# Patient Record
Sex: Female | Born: 1935 | Race: White | Hispanic: No | Smoking: Never smoker
Health system: Southern US, Community
[De-identification: ages and names within clinical notes are randomized; demographics above are authoritative.]

## PROBLEM LIST (undated history)

## (undated) DIAGNOSIS — I517 Cardiomegaly: Secondary | ICD-10-CM

## (undated) DIAGNOSIS — I509 Heart failure, unspecified: Secondary | ICD-10-CM

## (undated) DIAGNOSIS — Z90710 Acquired absence of both cervix and uterus: Secondary | ICD-10-CM

## (undated) DIAGNOSIS — G250 Essential tremor: Secondary | ICD-10-CM

## (undated) DIAGNOSIS — M797 Fibromyalgia: Secondary | ICD-10-CM

## (undated) DIAGNOSIS — W19XXXA Unspecified fall, initial encounter: Secondary | ICD-10-CM

## (undated) DIAGNOSIS — R296 Repeated falls: Secondary | ICD-10-CM

## (undated) DIAGNOSIS — D649 Anemia, unspecified: Secondary | ICD-10-CM

## (undated) DIAGNOSIS — I4891 Unspecified atrial fibrillation: Secondary | ICD-10-CM

## (undated) DIAGNOSIS — R0600 Dyspnea, unspecified: Secondary | ICD-10-CM

## (undated) DIAGNOSIS — F0390 Unspecified dementia without behavioral disturbance: Secondary | ICD-10-CM

## (undated) DIAGNOSIS — IMO0002 Reserved for concepts with insufficient information to code with codable children: Secondary | ICD-10-CM

## (undated) DIAGNOSIS — K5790 Diverticulosis of intestine, part unspecified, without perforation or abscess without bleeding: Secondary | ICD-10-CM

## (undated) DIAGNOSIS — M1990 Unspecified osteoarthritis, unspecified site: Secondary | ICD-10-CM

## (undated) DIAGNOSIS — F329 Major depressive disorder, single episode, unspecified: Secondary | ICD-10-CM

## (undated) DIAGNOSIS — J449 Chronic obstructive pulmonary disease, unspecified: Secondary | ICD-10-CM

## (undated) DIAGNOSIS — F32A Depression, unspecified: Secondary | ICD-10-CM

## (undated) DIAGNOSIS — Z86010 Personal history of colonic polyps: Secondary | ICD-10-CM

## (undated) DIAGNOSIS — R42 Dizziness and giddiness: Secondary | ICD-10-CM

## (undated) DIAGNOSIS — I10 Essential (primary) hypertension: Secondary | ICD-10-CM

## (undated) DIAGNOSIS — R251 Tremor, unspecified: Secondary | ICD-10-CM

## (undated) DIAGNOSIS — D509 Iron deficiency anemia, unspecified: Secondary | ICD-10-CM

## (undated) DIAGNOSIS — F419 Anxiety disorder, unspecified: Secondary | ICD-10-CM

## (undated) DIAGNOSIS — K219 Gastro-esophageal reflux disease without esophagitis: Secondary | ICD-10-CM

## (undated) DIAGNOSIS — G252 Other specified forms of tremor: Secondary | ICD-10-CM

## (undated) DIAGNOSIS — E039 Hypothyroidism, unspecified: Secondary | ICD-10-CM

## (undated) DIAGNOSIS — C801 Malignant (primary) neoplasm, unspecified: Secondary | ICD-10-CM

## (undated) DIAGNOSIS — Z86718 Personal history of other venous thrombosis and embolism: Secondary | ICD-10-CM

## (undated) DIAGNOSIS — K449 Diaphragmatic hernia without obstruction or gangrene: Secondary | ICD-10-CM

## (undated) HISTORY — DX: Other specified forms of tremor: G25.2

## (undated) HISTORY — DX: Chronic obstructive pulmonary disease, unspecified: J44.9

## (undated) HISTORY — PX: CATARACT EXTRACTION, BILATERAL: SHX1313

## (undated) HISTORY — DX: Dyspnea, unspecified: R06.00

## (undated) HISTORY — DX: Hypothyroidism, unspecified: E03.9

## (undated) HISTORY — DX: Personal history of other venous thrombosis and embolism: Z86.718

## (undated) HISTORY — DX: Malignant (primary) neoplasm, unspecified: C80.1

## (undated) HISTORY — DX: Essential (primary) hypertension: I10

## (undated) HISTORY — DX: Unspecified dementia without behavioral disturbance: F03.90

## (undated) HISTORY — DX: Unspecified fall, initial encounter: W19.XXXA

## (undated) HISTORY — DX: Anxiety disorder, unspecified: F41.9

## (undated) HISTORY — PX: TOTAL ABDOMINAL HYSTERECTOMY: SHX209

## (undated) HISTORY — DX: Anemia, unspecified: D64.9

## (undated) HISTORY — DX: Unspecified osteoarthritis, unspecified site: M19.90

## (undated) HISTORY — DX: Tremor, unspecified: R25.1

## (undated) HISTORY — DX: Gastro-esophageal reflux disease without esophagitis: K21.9

## (undated) HISTORY — DX: Depression, unspecified: F32.A

## (undated) HISTORY — DX: Essential tremor: G25.0

## (undated) HISTORY — DX: Cardiomegaly: I51.7

## (undated) HISTORY — DX: Heart failure, unspecified: I50.9

## (undated) HISTORY — DX: Personal history of colonic polyps: Z86.010

## (undated) HISTORY — DX: Reserved for concepts with insufficient information to code with codable children: IMO0002

## (undated) HISTORY — DX: Fibromyalgia: M79.7

## (undated) HISTORY — PX: KYPHOSIS SURGERY: SHX114

## (undated) HISTORY — DX: Dizziness and giddiness: R42

## (undated) HISTORY — DX: Diverticulosis of intestine, part unspecified, without perforation or abscess without bleeding: K57.90

## (undated) HISTORY — DX: Diaphragmatic hernia without obstruction or gangrene: K44.9

## (undated) HISTORY — DX: Unspecified atrial fibrillation: I48.91

## (undated) HISTORY — PX: SALPINGOOPHORECTOMY: SHX82

## (undated) HISTORY — DX: Major depressive disorder, single episode, unspecified: F32.9

## (undated) HISTORY — PX: OTHER SURGICAL HISTORY: SHX169

## (undated) HISTORY — DX: Acquired absence of both cervix and uterus: Z90.710

## (undated) HISTORY — DX: Iron deficiency anemia, unspecified: D50.9

## (undated) HISTORY — DX: Repeated falls: R29.6

---
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
## 1980-03-02 HISTORY — PX: OVARIAN CYST REMOVAL: SHX89

## 1995-03-03 HISTORY — PX: OTHER SURGICAL HISTORY: SHX169

## 1997-06-12 ENCOUNTER — Other Ambulatory Visit: Admission: RE | Admit: 1997-06-12 | Discharge: 1997-06-12 | Payer: Self-pay | Admitting: Internal Medicine

## 1997-08-03 ENCOUNTER — Other Ambulatory Visit: Admission: RE | Admit: 1997-08-03 | Discharge: 1997-08-03 | Payer: Self-pay | Admitting: Internal Medicine

## 1998-04-26 ENCOUNTER — Other Ambulatory Visit: Admission: RE | Admit: 1998-04-26 | Discharge: 1998-04-26 | Payer: Self-pay | Admitting: *Deleted

## 1998-11-06 ENCOUNTER — Other Ambulatory Visit: Admission: RE | Admit: 1998-11-06 | Discharge: 1998-11-06 | Payer: Self-pay | Admitting: Internal Medicine

## 1999-03-05 ENCOUNTER — Ambulatory Visit (HOSPITAL_COMMUNITY): Admission: RE | Admit: 1999-03-05 | Discharge: 1999-03-05 | Payer: Self-pay | Admitting: Gastroenterology

## 1999-03-05 ENCOUNTER — Encounter: Payer: Self-pay | Admitting: Gastroenterology

## 1999-03-19 ENCOUNTER — Ambulatory Visit (HOSPITAL_COMMUNITY): Admission: RE | Admit: 1999-03-19 | Discharge: 1999-03-19 | Payer: Self-pay | Admitting: Gastroenterology

## 1999-03-19 ENCOUNTER — Encounter: Payer: Self-pay | Admitting: Gastroenterology

## 1999-10-01 ENCOUNTER — Encounter: Payer: Self-pay | Admitting: Internal Medicine

## 1999-10-01 ENCOUNTER — Encounter: Admission: RE | Admit: 1999-10-01 | Discharge: 1999-10-01 | Payer: Self-pay | Admitting: Internal Medicine

## 1999-10-07 ENCOUNTER — Encounter (INDEPENDENT_AMBULATORY_CARE_PROVIDER_SITE_OTHER): Payer: Self-pay | Admitting: Specialist

## 1999-10-07 ENCOUNTER — Ambulatory Visit (HOSPITAL_COMMUNITY): Admission: RE | Admit: 1999-10-07 | Discharge: 1999-10-07 | Payer: Self-pay | Admitting: Gastroenterology

## 1999-11-04 ENCOUNTER — Observation Stay (HOSPITAL_COMMUNITY): Admission: AD | Admit: 1999-11-04 | Discharge: 1999-11-05 | Payer: Self-pay | Admitting: Internal Medicine

## 2000-05-06 ENCOUNTER — Other Ambulatory Visit: Admission: RE | Admit: 2000-05-06 | Discharge: 2000-05-06 | Payer: Self-pay | Admitting: Internal Medicine

## 2000-10-06 ENCOUNTER — Encounter: Payer: Self-pay | Admitting: Internal Medicine

## 2000-10-06 ENCOUNTER — Encounter: Admission: RE | Admit: 2000-10-06 | Discharge: 2000-10-06 | Payer: Self-pay | Admitting: Internal Medicine

## 2000-12-08 ENCOUNTER — Encounter: Payer: Self-pay | Admitting: Internal Medicine

## 2000-12-08 ENCOUNTER — Encounter: Admission: RE | Admit: 2000-12-08 | Discharge: 2000-12-08 | Payer: Self-pay | Admitting: Internal Medicine

## 2001-03-10 ENCOUNTER — Encounter: Payer: Self-pay | Admitting: Urology

## 2001-03-10 ENCOUNTER — Encounter: Admission: RE | Admit: 2001-03-10 | Discharge: 2001-03-10 | Payer: Self-pay | Admitting: Urology

## 2001-05-30 ENCOUNTER — Encounter: Admission: RE | Admit: 2001-05-30 | Discharge: 2001-08-28 | Payer: Self-pay | Admitting: Unknown Physician Specialty

## 2001-07-22 ENCOUNTER — Encounter: Payer: Self-pay | Admitting: Internal Medicine

## 2001-07-22 ENCOUNTER — Encounter: Admission: RE | Admit: 2001-07-22 | Discharge: 2001-07-22 | Payer: Self-pay | Admitting: Internal Medicine

## 2001-10-03 ENCOUNTER — Encounter: Payer: Self-pay | Admitting: Orthopedic Surgery

## 2001-10-03 ENCOUNTER — Encounter: Admission: RE | Admit: 2001-10-03 | Discharge: 2001-10-03 | Payer: Self-pay | Admitting: Orthopedic Surgery

## 2001-10-04 ENCOUNTER — Ambulatory Visit (HOSPITAL_BASED_OUTPATIENT_CLINIC_OR_DEPARTMENT_OTHER): Admission: RE | Admit: 2001-10-04 | Discharge: 2001-10-05 | Payer: Self-pay | Admitting: Orthopedic Surgery

## 2001-10-04 ENCOUNTER — Encounter (INDEPENDENT_AMBULATORY_CARE_PROVIDER_SITE_OTHER): Payer: Self-pay | Admitting: Specialist

## 2001-11-16 ENCOUNTER — Encounter: Payer: Self-pay | Admitting: Internal Medicine

## 2001-11-16 ENCOUNTER — Encounter: Admission: RE | Admit: 2001-11-16 | Discharge: 2001-11-16 | Payer: Self-pay | Admitting: Internal Medicine

## 2002-03-15 ENCOUNTER — Encounter: Admission: RE | Admit: 2002-03-15 | Discharge: 2002-03-15 | Payer: Self-pay | Admitting: Internal Medicine

## 2002-03-15 ENCOUNTER — Encounter: Payer: Self-pay | Admitting: Internal Medicine

## 2002-04-28 ENCOUNTER — Encounter: Payer: Self-pay | Admitting: Internal Medicine

## 2002-04-28 ENCOUNTER — Inpatient Hospital Stay (HOSPITAL_COMMUNITY): Admission: EM | Admit: 2002-04-28 | Discharge: 2002-05-01 | Payer: Self-pay | Admitting: Emergency Medicine

## 2002-04-28 ENCOUNTER — Ambulatory Visit: Admission: RE | Admit: 2002-04-28 | Discharge: 2002-04-28 | Payer: Self-pay | Admitting: Orthopedic Surgery

## 2002-06-12 ENCOUNTER — Encounter: Admission: RE | Admit: 2002-06-12 | Discharge: 2002-06-12 | Payer: Self-pay | Admitting: Internal Medicine

## 2002-06-12 ENCOUNTER — Encounter: Payer: Self-pay | Admitting: Internal Medicine

## 2002-07-04 ENCOUNTER — Ambulatory Visit: Admission: RE | Admit: 2002-07-04 | Discharge: 2002-07-04 | Payer: Self-pay | Admitting: Internal Medicine

## 2002-07-06 ENCOUNTER — Encounter: Admission: RE | Admit: 2002-07-06 | Discharge: 2002-07-06 | Payer: Self-pay | Admitting: Orthopedic Surgery

## 2002-07-06 ENCOUNTER — Encounter: Payer: Self-pay | Admitting: Orthopedic Surgery

## 2002-07-07 ENCOUNTER — Ambulatory Visit (HOSPITAL_BASED_OUTPATIENT_CLINIC_OR_DEPARTMENT_OTHER): Admission: RE | Admit: 2002-07-07 | Discharge: 2002-07-07 | Payer: Self-pay | Admitting: Orthopedic Surgery

## 2002-07-18 ENCOUNTER — Ambulatory Visit (HOSPITAL_COMMUNITY): Admission: RE | Admit: 2002-07-18 | Discharge: 2002-07-18 | Payer: Self-pay | Admitting: Orthopedic Surgery

## 2002-11-20 ENCOUNTER — Encounter: Payer: Self-pay | Admitting: Internal Medicine

## 2002-11-20 ENCOUNTER — Encounter: Admission: RE | Admit: 2002-11-20 | Discharge: 2002-11-20 | Payer: Self-pay | Admitting: Internal Medicine

## 2003-05-08 ENCOUNTER — Ambulatory Visit: Admission: RE | Admit: 2003-05-08 | Discharge: 2003-05-08 | Payer: Self-pay | Admitting: Orthopedic Surgery

## 2003-08-01 ENCOUNTER — Encounter: Admission: RE | Admit: 2003-08-01 | Discharge: 2003-08-01 | Payer: Self-pay | Admitting: Internal Medicine

## 2003-11-20 ENCOUNTER — Encounter: Admission: RE | Admit: 2003-11-20 | Discharge: 2003-11-20 | Payer: Self-pay | Admitting: Internal Medicine

## 2004-09-24 ENCOUNTER — Encounter: Admission: RE | Admit: 2004-09-24 | Discharge: 2004-09-24 | Payer: Self-pay | Admitting: Internal Medicine

## 2004-09-29 ENCOUNTER — Ambulatory Visit (HOSPITAL_COMMUNITY): Admission: RE | Admit: 2004-09-29 | Discharge: 2004-09-29 | Payer: Self-pay | Admitting: Internal Medicine

## 2004-11-27 ENCOUNTER — Encounter: Admission: RE | Admit: 2004-11-27 | Discharge: 2004-11-27 | Payer: Self-pay | Admitting: Internal Medicine

## 2004-12-09 ENCOUNTER — Encounter: Admission: RE | Admit: 2004-12-09 | Discharge: 2004-12-09 | Payer: Self-pay | Admitting: Internal Medicine

## 2004-12-29 ENCOUNTER — Ambulatory Visit: Payer: Self-pay | Admitting: Internal Medicine

## 2005-02-12 ENCOUNTER — Emergency Department: Payer: Self-pay | Admitting: Emergency Medicine

## 2005-02-24 ENCOUNTER — Encounter: Admission: RE | Admit: 2005-02-24 | Discharge: 2005-02-24 | Payer: Self-pay | Admitting: Internal Medicine

## 2005-03-23 ENCOUNTER — Encounter: Admission: RE | Admit: 2005-03-23 | Discharge: 2005-03-23 | Payer: Self-pay | Admitting: Neurology

## 2005-04-08 ENCOUNTER — Encounter: Admission: RE | Admit: 2005-04-08 | Discharge: 2005-07-07 | Payer: Self-pay | Admitting: Neurology

## 2005-06-24 ENCOUNTER — Encounter: Admission: RE | Admit: 2005-06-24 | Discharge: 2005-06-24 | Payer: Self-pay | Admitting: Internal Medicine

## 2005-08-12 ENCOUNTER — Ambulatory Visit: Payer: Self-pay

## 2005-08-30 ENCOUNTER — Ambulatory Visit: Payer: Self-pay

## 2005-09-30 ENCOUNTER — Ambulatory Visit: Payer: Self-pay

## 2005-12-11 ENCOUNTER — Encounter: Admission: RE | Admit: 2005-12-11 | Discharge: 2005-12-11 | Payer: Self-pay | Admitting: Internal Medicine

## 2006-02-02 ENCOUNTER — Ambulatory Visit: Payer: Self-pay | Admitting: Internal Medicine

## 2006-02-26 ENCOUNTER — Ambulatory Visit: Payer: Self-pay | Admitting: Family Medicine

## 2006-03-03 ENCOUNTER — Ambulatory Visit: Payer: Self-pay | Admitting: Family Medicine

## 2006-04-06 ENCOUNTER — Ambulatory Visit: Payer: Self-pay | Admitting: Internal Medicine

## 2006-05-07 ENCOUNTER — Ambulatory Visit: Payer: Self-pay | Admitting: Internal Medicine

## 2006-05-12 ENCOUNTER — Ambulatory Visit (HOSPITAL_COMMUNITY): Admission: RE | Admit: 2006-05-12 | Discharge: 2006-05-12 | Payer: Self-pay | Admitting: Orthopaedic Surgery

## 2006-05-14 ENCOUNTER — Encounter (INDEPENDENT_AMBULATORY_CARE_PROVIDER_SITE_OTHER): Payer: Self-pay | Admitting: Specialist

## 2006-05-14 ENCOUNTER — Ambulatory Visit (HOSPITAL_COMMUNITY): Admission: RE | Admit: 2006-05-14 | Discharge: 2006-05-14 | Payer: Self-pay | Admitting: Interventional Radiology

## 2006-05-21 ENCOUNTER — Inpatient Hospital Stay (HOSPITAL_COMMUNITY): Admission: EM | Admit: 2006-05-21 | Discharge: 2006-05-25 | Payer: Self-pay | Admitting: Emergency Medicine

## 2006-06-01 ENCOUNTER — Encounter: Payer: Self-pay | Admitting: Interventional Radiology

## 2006-06-23 ENCOUNTER — Ambulatory Visit: Payer: Self-pay | Admitting: Internal Medicine

## 2006-07-20 ENCOUNTER — Ambulatory Visit: Payer: Self-pay | Admitting: Internal Medicine

## 2006-07-20 DIAGNOSIS — G479 Sleep disorder, unspecified: Secondary | ICD-10-CM | POA: Insufficient documentation

## 2006-08-06 ENCOUNTER — Ambulatory Visit: Payer: Self-pay | Admitting: Internal Medicine

## 2006-09-15 ENCOUNTER — Telehealth (INDEPENDENT_AMBULATORY_CARE_PROVIDER_SITE_OTHER): Payer: Self-pay | Admitting: *Deleted

## 2006-09-16 ENCOUNTER — Ambulatory Visit: Payer: Self-pay | Admitting: Family Medicine

## 2006-09-23 ENCOUNTER — Telehealth (INDEPENDENT_AMBULATORY_CARE_PROVIDER_SITE_OTHER): Payer: Self-pay | Admitting: *Deleted

## 2006-09-28 ENCOUNTER — Encounter: Payer: Self-pay | Admitting: Internal Medicine

## 2006-09-29 ENCOUNTER — Ambulatory Visit: Payer: Self-pay | Admitting: Family Medicine

## 2006-09-30 LAB — CONVERTED CEMR LAB
Hgb A1c MFr Bld: 6.8 % — ABNORMAL HIGH (ref 4.6–6.0)
VLDL: 24 mg/dL (ref 0–40)

## 2006-10-04 ENCOUNTER — Ambulatory Visit: Payer: Self-pay | Admitting: Family Medicine

## 2006-10-13 DIAGNOSIS — K449 Diaphragmatic hernia without obstruction or gangrene: Secondary | ICD-10-CM

## 2006-10-13 DIAGNOSIS — G252 Other specified forms of tremor: Secondary | ICD-10-CM

## 2006-10-13 DIAGNOSIS — G250 Essential tremor: Secondary | ICD-10-CM

## 2006-10-13 DIAGNOSIS — Z860100 Personal history of colon polyps, unspecified: Secondary | ICD-10-CM | POA: Insufficient documentation

## 2006-10-13 DIAGNOSIS — E039 Hypothyroidism, unspecified: Secondary | ICD-10-CM

## 2006-10-13 DIAGNOSIS — K5730 Diverticulosis of large intestine without perforation or abscess without bleeding: Secondary | ICD-10-CM | POA: Insufficient documentation

## 2006-10-13 DIAGNOSIS — I2510 Atherosclerotic heart disease of native coronary artery without angina pectoris: Secondary | ICD-10-CM | POA: Insufficient documentation

## 2006-10-13 DIAGNOSIS — F411 Generalized anxiety disorder: Secondary | ICD-10-CM

## 2006-10-13 DIAGNOSIS — J449 Chronic obstructive pulmonary disease, unspecified: Secondary | ICD-10-CM

## 2006-10-13 DIAGNOSIS — F3289 Other specified depressive episodes: Secondary | ICD-10-CM | POA: Insufficient documentation

## 2006-10-13 DIAGNOSIS — J4489 Other specified chronic obstructive pulmonary disease: Secondary | ICD-10-CM

## 2006-10-13 DIAGNOSIS — F329 Major depressive disorder, single episode, unspecified: Secondary | ICD-10-CM

## 2006-10-13 HISTORY — DX: Diaphragmatic hernia without obstruction or gangrene: K44.9

## 2006-10-13 HISTORY — DX: Other specified forms of tremor: G25.0

## 2006-10-13 HISTORY — DX: Other specified chronic obstructive pulmonary disease: J44.89

## 2006-10-13 HISTORY — DX: Personal history of colonic polyps: Z86.010

## 2006-10-13 HISTORY — DX: Personal history of colon polyps, unspecified: Z86.0100

## 2006-10-13 HISTORY — DX: Chronic obstructive pulmonary disease, unspecified: J44.9

## 2006-10-17 DIAGNOSIS — I10 Essential (primary) hypertension: Secondary | ICD-10-CM | POA: Insufficient documentation

## 2006-10-17 DIAGNOSIS — M1990 Unspecified osteoarthritis, unspecified site: Secondary | ICD-10-CM | POA: Insufficient documentation

## 2006-10-21 ENCOUNTER — Ambulatory Visit: Payer: Self-pay | Admitting: Internal Medicine

## 2006-10-22 LAB — CONVERTED CEMR LAB
BUN: 21 mg/dL (ref 6–23)
Calcium: 9.2 mg/dL (ref 8.4–10.5)
Creatinine,U: 77.9 mg/dL
GFR calc Af Amer: 89 mL/min
Microalb, Ur: 2.7 mg/dL — ABNORMAL HIGH (ref 0.0–1.9)
Potassium: 4.5 meq/L (ref 3.5–5.1)
TSH: 2.48 microintl units/mL (ref 0.35–5.50)

## 2006-12-06 ENCOUNTER — Encounter: Payer: Self-pay | Admitting: Internal Medicine

## 2006-12-08 ENCOUNTER — Telehealth (INDEPENDENT_AMBULATORY_CARE_PROVIDER_SITE_OTHER): Payer: Self-pay | Admitting: *Deleted

## 2006-12-10 ENCOUNTER — Telehealth (INDEPENDENT_AMBULATORY_CARE_PROVIDER_SITE_OTHER): Payer: Self-pay | Admitting: *Deleted

## 2006-12-14 ENCOUNTER — Encounter: Admission: RE | Admit: 2006-12-14 | Discharge: 2006-12-14 | Payer: Self-pay | Admitting: Internal Medicine

## 2006-12-17 ENCOUNTER — Encounter (INDEPENDENT_AMBULATORY_CARE_PROVIDER_SITE_OTHER): Payer: Self-pay | Admitting: *Deleted

## 2007-01-26 ENCOUNTER — Encounter: Payer: Self-pay | Admitting: Internal Medicine

## 2007-03-14 ENCOUNTER — Telehealth: Payer: Self-pay | Admitting: Internal Medicine

## 2007-03-23 ENCOUNTER — Ambulatory Visit: Payer: Self-pay | Admitting: Family Medicine

## 2007-03-28 ENCOUNTER — Ambulatory Visit: Payer: Self-pay | Admitting: Family Medicine

## 2007-03-31 ENCOUNTER — Telehealth (INDEPENDENT_AMBULATORY_CARE_PROVIDER_SITE_OTHER): Payer: Self-pay | Admitting: Internal Medicine

## 2007-04-01 ENCOUNTER — Encounter (INDEPENDENT_AMBULATORY_CARE_PROVIDER_SITE_OTHER): Payer: Self-pay | Admitting: Internal Medicine

## 2007-04-01 ENCOUNTER — Ambulatory Visit: Payer: Self-pay | Admitting: Family Medicine

## 2007-04-05 ENCOUNTER — Ambulatory Visit: Payer: Self-pay | Admitting: Family Medicine

## 2007-04-12 ENCOUNTER — Telehealth (INDEPENDENT_AMBULATORY_CARE_PROVIDER_SITE_OTHER): Payer: Self-pay | Admitting: *Deleted

## 2007-05-30 ENCOUNTER — Telehealth (INDEPENDENT_AMBULATORY_CARE_PROVIDER_SITE_OTHER): Payer: Self-pay | Admitting: *Deleted

## 2007-06-03 ENCOUNTER — Ambulatory Visit: Payer: Self-pay | Admitting: Internal Medicine

## 2007-06-08 ENCOUNTER — Ambulatory Visit: Payer: Self-pay | Admitting: Internal Medicine

## 2007-06-22 ENCOUNTER — Ambulatory Visit: Payer: Self-pay | Admitting: Family Medicine

## 2007-06-22 LAB — CONVERTED CEMR LAB
Bacteria, UA: 0
Bilirubin Urine: NEGATIVE
Specific Gravity, Urine: 1.01
WBC Urine, dipstick: NEGATIVE

## 2007-06-23 ENCOUNTER — Encounter (INDEPENDENT_AMBULATORY_CARE_PROVIDER_SITE_OTHER): Payer: Self-pay | Admitting: Internal Medicine

## 2007-06-27 ENCOUNTER — Telehealth (INDEPENDENT_AMBULATORY_CARE_PROVIDER_SITE_OTHER): Payer: Self-pay | Admitting: *Deleted

## 2007-06-27 ENCOUNTER — Telehealth (INDEPENDENT_AMBULATORY_CARE_PROVIDER_SITE_OTHER): Payer: Self-pay | Admitting: Internal Medicine

## 2007-06-28 ENCOUNTER — Telehealth (INDEPENDENT_AMBULATORY_CARE_PROVIDER_SITE_OTHER): Payer: Self-pay | Admitting: *Deleted

## 2007-07-04 ENCOUNTER — Ambulatory Visit: Payer: Self-pay | Admitting: Internal Medicine

## 2007-09-26 ENCOUNTER — Telehealth (INDEPENDENT_AMBULATORY_CARE_PROVIDER_SITE_OTHER): Payer: Self-pay | Admitting: *Deleted

## 2007-11-11 ENCOUNTER — Telehealth (INDEPENDENT_AMBULATORY_CARE_PROVIDER_SITE_OTHER): Payer: Self-pay | Admitting: Internal Medicine

## 2007-11-16 ENCOUNTER — Ambulatory Visit: Payer: Self-pay | Admitting: Family Medicine

## 2007-11-22 ENCOUNTER — Ambulatory Visit: Payer: Self-pay | Admitting: Family Medicine

## 2007-11-22 DIAGNOSIS — D649 Anemia, unspecified: Secondary | ICD-10-CM

## 2007-11-22 LAB — CONVERTED CEMR LAB
Basophils Absolute: 0.1 10*3/uL (ref 0.0–0.1)
Basophils Relative: 0.6 % (ref 0.0–3.0)
CO2: 28 meq/L (ref 19–32)
Chloride: 100 meq/L (ref 96–112)
Creatinine,U: 81.7 mg/dL
Eosinophils Absolute: 0.6 10*3/uL (ref 0.0–0.7)
GFR calc non Af Amer: 73 mL/min
Iron: 24 ug/dL — ABNORMAL LOW (ref 42–145)
Lymphocytes Relative: 22.2 % (ref 12.0–46.0)
MCHC: 32 g/dL (ref 30.0–36.0)
Microalb Creat Ratio: 52.6 mg/g — ABNORMAL HIGH (ref 0.0–30.0)
Microalb, Ur: 4.3 mg/dL — ABNORMAL HIGH (ref 0.0–1.9)
Neutrophils Relative %: 61.8 % (ref 43.0–77.0)
Platelets: 298 10*3/uL (ref 150–400)
Potassium: 4.4 meq/L (ref 3.5–5.1)
RBC: 4.49 M/uL (ref 3.87–5.11)
RDW: 16.8 % — ABNORMAL HIGH (ref 11.5–14.6)
Sodium: 135 meq/L (ref 135–145)
Transferrin: 363 mg/dL — ABNORMAL HIGH (ref >=212.0)

## 2007-11-23 LAB — CONVERTED CEMR LAB
Iron: 23 ug/dL — ABNORMAL LOW (ref 42–145)
Saturation Ratios: 4.7 % — ABNORMAL LOW (ref 20.0–50.0)
Transferrin: 351.3 mg/dL (ref >=212.0)

## 2007-12-15 ENCOUNTER — Encounter: Admission: RE | Admit: 2007-12-15 | Discharge: 2007-12-15 | Payer: Self-pay | Admitting: Internal Medicine

## 2007-12-20 ENCOUNTER — Telehealth: Payer: Self-pay | Admitting: Internal Medicine

## 2007-12-20 ENCOUNTER — Encounter: Payer: Self-pay | Admitting: Internal Medicine

## 2007-12-20 ENCOUNTER — Encounter (INDEPENDENT_AMBULATORY_CARE_PROVIDER_SITE_OTHER): Payer: Self-pay | Admitting: *Deleted

## 2007-12-29 ENCOUNTER — Ambulatory Visit: Payer: Self-pay | Admitting: Family Medicine

## 2008-01-10 ENCOUNTER — Telehealth: Payer: Self-pay | Admitting: Internal Medicine

## 2008-04-16 ENCOUNTER — Telehealth: Payer: Self-pay | Admitting: Internal Medicine

## 2008-07-04 ENCOUNTER — Ambulatory Visit: Payer: Self-pay | Admitting: Family Medicine

## 2008-07-06 LAB — CONVERTED CEMR LAB
ALT: 27 units/L (ref 0–35)
AST: 32 units/L (ref 0–37)
Alkaline Phosphatase: 52 units/L (ref 39–117)
Basophils Absolute: 0.1 10*3/uL (ref 0.0–0.1)
Calcium: 9.3 mg/dL (ref 8.4–10.5)
Eosinophils Relative: 5.2 % — ABNORMAL HIGH (ref 0.0–5.0)
Free T4: 1 ng/dL (ref 0.6–1.6)
GFR calc non Af Amer: 73.09 mL/min (ref >60)
Glucose, Bld: 138 mg/dL — ABNORMAL HIGH (ref 70–99)
HCT: 38.8 % (ref 36.0–46.0)
Hemoglobin: 13 g/dL (ref 12.0–15.0)
Hgb A1c MFr Bld: 7.2 % — ABNORMAL HIGH (ref 4.6–6.5)
Lymphocytes Relative: 29 % (ref 12.0–46.0)
Lymphs Abs: 2.6 10*3/uL (ref 0.7–4.0)
Monocytes Relative: 10.4 % (ref 3.0–12.0)
Neutro Abs: 4.8 10*3/uL (ref 1.4–7.7)
Platelets: 208 10*3/uL (ref 150.0–400.0)
Potassium: 5 meq/L (ref 3.5–5.1)
RDW: 12.8 % (ref 11.5–14.6)
Sodium: 139 meq/L (ref 135–145)
Total Bilirubin: 0.8 mg/dL (ref 0.3–1.2)
WBC: 8.9 10*3/uL (ref 4.5–10.5)

## 2008-07-12 ENCOUNTER — Telehealth: Payer: Self-pay | Admitting: Family Medicine

## 2008-07-18 ENCOUNTER — Encounter: Payer: Self-pay | Admitting: Family Medicine

## 2008-07-23 ENCOUNTER — Telehealth: Payer: Self-pay | Admitting: Family Medicine

## 2008-07-27 ENCOUNTER — Ambulatory Visit: Payer: Self-pay | Admitting: Family Medicine

## 2008-07-27 ENCOUNTER — Encounter (INDEPENDENT_AMBULATORY_CARE_PROVIDER_SITE_OTHER): Payer: Self-pay | Admitting: *Deleted

## 2008-07-27 LAB — CONVERTED CEMR LAB
CO2: 30 meq/L (ref 19–32)
Chloride: 104 meq/L (ref 96–112)
Potassium: 4.7 meq/L (ref 3.5–5.1)
Sodium: 138 meq/L (ref 135–145)

## 2008-07-31 ENCOUNTER — Telehealth: Payer: Self-pay | Admitting: Family Medicine

## 2008-08-01 ENCOUNTER — Encounter: Payer: Self-pay | Admitting: Family Medicine

## 2008-08-06 ENCOUNTER — Encounter: Payer: Self-pay | Admitting: Family Medicine

## 2008-08-06 ENCOUNTER — Ambulatory Visit: Payer: Self-pay | Admitting: Cardiovascular Disease

## 2008-08-23 ENCOUNTER — Encounter: Payer: Self-pay | Admitting: Family Medicine

## 2008-08-23 ENCOUNTER — Telehealth: Payer: Self-pay | Admitting: Family Medicine

## 2008-10-02 ENCOUNTER — Ambulatory Visit: Payer: Self-pay | Admitting: Family Medicine

## 2008-10-02 DIAGNOSIS — R42 Dizziness and giddiness: Secondary | ICD-10-CM | POA: Insufficient documentation

## 2008-10-02 DIAGNOSIS — E119 Type 2 diabetes mellitus without complications: Secondary | ICD-10-CM

## 2008-10-02 HISTORY — DX: Dizziness and giddiness: R42

## 2008-10-02 LAB — CONVERTED CEMR LAB
Bilirubin Urine: NEGATIVE
Blood in Urine, dipstick: NEGATIVE
Glucose, Urine, Semiquant: NEGATIVE
Ketones, urine, test strip: NEGATIVE
Protein, U semiquant: NEGATIVE
Urobilinogen, UA: 0.2
pH: 6

## 2008-10-03 ENCOUNTER — Encounter: Payer: Self-pay | Admitting: Family Medicine

## 2008-10-04 ENCOUNTER — Encounter (INDEPENDENT_AMBULATORY_CARE_PROVIDER_SITE_OTHER): Payer: Self-pay | Admitting: *Deleted

## 2008-10-04 LAB — CONVERTED CEMR LAB: Hgb A1c MFr Bld: 7.1 % — ABNORMAL HIGH (ref 4.6–6.5)

## 2008-10-10 ENCOUNTER — Ambulatory Visit: Payer: Self-pay | Admitting: Family Medicine

## 2008-10-10 LAB — CONVERTED CEMR LAB
Ketones, urine, test strip: NEGATIVE
Nitrite: NEGATIVE
Protein, U semiquant: 30
WBC Urine, dipstick: NEGATIVE

## 2008-10-11 LAB — CONVERTED CEMR LAB
Microalb Creat Ratio: 69.9 mg/g — ABNORMAL HIGH (ref 0.0–30.0)
Microalb, Ur: 15.4 mg/dL — ABNORMAL HIGH (ref 0.0–1.9)

## 2008-10-16 ENCOUNTER — Telehealth: Payer: Self-pay | Admitting: Family Medicine

## 2008-10-16 ENCOUNTER — Telehealth (INDEPENDENT_AMBULATORY_CARE_PROVIDER_SITE_OTHER): Payer: Self-pay | Admitting: *Deleted

## 2008-11-13 ENCOUNTER — Telehealth: Payer: Self-pay | Admitting: Family Medicine

## 2008-12-17 ENCOUNTER — Encounter: Admission: RE | Admit: 2008-12-17 | Discharge: 2008-12-17 | Payer: Self-pay | Admitting: Family Medicine

## 2008-12-19 ENCOUNTER — Encounter (INDEPENDENT_AMBULATORY_CARE_PROVIDER_SITE_OTHER): Payer: Self-pay | Admitting: *Deleted

## 2009-01-07 ENCOUNTER — Ambulatory Visit: Payer: Self-pay | Admitting: Family Medicine

## 2009-01-09 ENCOUNTER — Encounter: Payer: Self-pay | Admitting: Family Medicine

## 2009-01-11 LAB — CONVERTED CEMR LAB
AST: 25 units/L (ref 0–37)
Alkaline Phosphatase: 49 units/L (ref 39–117)
Basophils Absolute: 0.1 10*3/uL (ref 0.0–0.1)
Bilirubin, Direct: 0.1 mg/dL (ref 0.0–0.3)
Calcium: 9.3 mg/dL (ref 8.4–10.5)
Creatinine, Ser: 0.8 mg/dL (ref 0.4–1.2)
Eosinophils Absolute: 0.5 10*3/uL (ref 0.0–0.7)
Free T4: 0.9 ng/dL (ref 0.6–1.6)
GFR calc non Af Amer: 72.99 mL/min (ref >60)
Glucose, Bld: 124 mg/dL — ABNORMAL HIGH (ref 70–99)
Hemoglobin: 12.9 g/dL (ref 12.0–15.0)
Hgb A1c MFr Bld: 7.1 % — ABNORMAL HIGH (ref 4.6–6.5)
Lymphocytes Relative: 28.4 % (ref 12.0–46.0)
Monocytes Relative: 8.8 % (ref 3.0–12.0)
Neutro Abs: 5.3 10*3/uL (ref 1.4–7.7)
Neutrophils Relative %: 56.4 % (ref 43.0–77.0)
RDW: 13.5 % (ref 11.5–14.6)
Sodium: 136 meq/L (ref 135–145)
T4, Total: 7.3 ug/dL (ref 5.0–12.5)
Total Bilirubin: 0.8 mg/dL (ref 0.3–1.2)

## 2009-01-14 ENCOUNTER — Telehealth: Payer: Self-pay | Admitting: Family Medicine

## 2009-01-18 ENCOUNTER — Telehealth: Payer: Self-pay | Admitting: Family Medicine

## 2009-02-28 ENCOUNTER — Telehealth: Payer: Self-pay | Admitting: Family Medicine

## 2009-04-23 ENCOUNTER — Telehealth: Payer: Self-pay | Admitting: Family Medicine

## 2009-04-24 ENCOUNTER — Telehealth: Payer: Self-pay | Admitting: Family Medicine

## 2009-04-25 ENCOUNTER — Ambulatory Visit: Payer: Self-pay | Admitting: Family Medicine

## 2009-05-02 ENCOUNTER — Ambulatory Visit: Payer: Self-pay | Admitting: Family Medicine

## 2009-05-10 ENCOUNTER — Telehealth: Payer: Self-pay | Admitting: Family Medicine

## 2009-05-16 ENCOUNTER — Ambulatory Visit: Payer: Self-pay | Admitting: Family Medicine

## 2009-05-16 DIAGNOSIS — F0390 Unspecified dementia without behavioral disturbance: Secondary | ICD-10-CM

## 2009-05-16 HISTORY — DX: Unspecified dementia, unspecified severity, without behavioral disturbance, psychotic disturbance, mood disturbance, and anxiety: F03.90

## 2009-06-01 ENCOUNTER — Ambulatory Visit: Payer: Self-pay | Admitting: Family Medicine

## 2009-06-01 DIAGNOSIS — R141 Gas pain: Secondary | ICD-10-CM | POA: Insufficient documentation

## 2009-06-01 DIAGNOSIS — R143 Flatulence: Secondary | ICD-10-CM

## 2009-06-01 DIAGNOSIS — R142 Eructation: Secondary | ICD-10-CM

## 2009-06-03 ENCOUNTER — Telehealth: Payer: Self-pay | Admitting: Family Medicine

## 2009-06-06 ENCOUNTER — Telehealth: Payer: Self-pay | Admitting: Family Medicine

## 2009-06-10 ENCOUNTER — Ambulatory Visit: Payer: Self-pay | Admitting: Family Medicine

## 2009-06-10 DIAGNOSIS — R1032 Left lower quadrant pain: Secondary | ICD-10-CM | POA: Insufficient documentation

## 2009-06-11 LAB — CONVERTED CEMR LAB
Chloride: 97 meq/L (ref 96–112)
GFR calc non Af Amer: 72.92 mL/min (ref >60)
Hgb A1c MFr Bld: 7.7 % — ABNORMAL HIGH (ref 4.6–6.5)
Potassium: 4.7 meq/L (ref 3.5–5.1)
Sodium: 136 meq/L (ref 135–145)

## 2009-06-25 ENCOUNTER — Encounter: Payer: Self-pay | Admitting: Family Medicine

## 2009-06-27 ENCOUNTER — Encounter: Payer: Self-pay | Admitting: Family Medicine

## 2009-07-01 ENCOUNTER — Telehealth: Payer: Self-pay | Admitting: Family Medicine

## 2009-07-02 ENCOUNTER — Ambulatory Visit: Payer: Self-pay | Admitting: Internal Medicine

## 2009-07-04 ENCOUNTER — Telehealth: Payer: Self-pay | Admitting: Internal Medicine

## 2009-07-15 ENCOUNTER — Ambulatory Visit: Payer: Self-pay | Admitting: Family Medicine

## 2009-07-18 ENCOUNTER — Encounter: Payer: Self-pay | Admitting: Family Medicine

## 2009-07-22 ENCOUNTER — Telehealth (INDEPENDENT_AMBULATORY_CARE_PROVIDER_SITE_OTHER): Payer: Self-pay | Admitting: *Deleted

## 2009-07-23 ENCOUNTER — Ambulatory Visit: Payer: Self-pay | Admitting: Internal Medicine

## 2009-07-23 DIAGNOSIS — R0602 Shortness of breath: Secondary | ICD-10-CM

## 2009-08-26 ENCOUNTER — Telehealth: Payer: Self-pay | Admitting: Family Medicine

## 2009-09-09 ENCOUNTER — Ambulatory Visit: Payer: Self-pay | Admitting: Family Medicine

## 2009-09-10 ENCOUNTER — Telehealth: Payer: Self-pay | Admitting: Family Medicine

## 2009-09-18 ENCOUNTER — Telehealth (INDEPENDENT_AMBULATORY_CARE_PROVIDER_SITE_OTHER): Payer: Self-pay | Admitting: *Deleted

## 2009-09-19 ENCOUNTER — Telehealth: Payer: Self-pay | Admitting: Family Medicine

## 2009-09-24 ENCOUNTER — Ambulatory Visit: Payer: Self-pay | Admitting: Family Medicine

## 2009-10-07 ENCOUNTER — Telehealth: Payer: Self-pay | Admitting: Family Medicine

## 2009-10-10 ENCOUNTER — Telehealth: Payer: Self-pay | Admitting: Family Medicine

## 2009-10-16 ENCOUNTER — Ambulatory Visit: Payer: Self-pay | Admitting: Family Medicine

## 2009-10-17 LAB — CONVERTED CEMR LAB
Basophils Relative: 0.6 % (ref 0.0–3.0)
CO2: 29 meq/L (ref 19–32)
Chloride: 96 meq/L (ref 96–112)
Creatinine, Ser: 0.8 mg/dL (ref 0.4–1.2)
Eosinophils Absolute: 0.4 10*3/uL (ref 0.0–0.7)
Eosinophils Relative: 3.6 % (ref 0.0–5.0)
Hemoglobin: 12.3 g/dL (ref 12.0–15.0)
Lymphocytes Relative: 26.8 % (ref 12.0–46.0)
MCHC: 33.7 g/dL (ref 30.0–36.0)
MCV: 82.7 fL (ref 78.0–100.0)
Monocytes Absolute: 0.8 10*3/uL (ref 0.1–1.0)
Neutro Abs: 5.9 10*3/uL (ref 1.4–7.7)
Neutrophils Relative %: 61.2 % (ref 43.0–77.0)
RBC: 4.42 M/uL (ref 3.87–5.11)
Sodium: 137 meq/L (ref 135–145)
TSH: 1.44 microintl units/mL (ref 0.35–5.50)
WBC: 9.7 10*3/uL (ref 4.5–10.5)

## 2009-10-21 ENCOUNTER — Ambulatory Visit: Payer: Self-pay | Admitting: Family Medicine

## 2009-10-23 ENCOUNTER — Telehealth: Payer: Self-pay | Admitting: Family Medicine

## 2009-10-28 ENCOUNTER — Encounter: Payer: Self-pay | Admitting: Family Medicine

## 2009-10-30 ENCOUNTER — Ambulatory Visit: Payer: Self-pay | Admitting: Cardiology

## 2009-10-30 ENCOUNTER — Encounter: Payer: Self-pay | Admitting: Family Medicine

## 2009-11-05 ENCOUNTER — Telehealth: Payer: Self-pay | Admitting: Family Medicine

## 2009-11-08 ENCOUNTER — Ambulatory Visit (HOSPITAL_COMMUNITY): Admission: RE | Admit: 2009-11-08 | Discharge: 2009-11-08 | Payer: Self-pay | Admitting: Cardiology

## 2009-11-08 ENCOUNTER — Ambulatory Visit: Payer: Self-pay | Admitting: Cardiology

## 2009-11-08 ENCOUNTER — Encounter: Payer: Self-pay | Admitting: Family Medicine

## 2009-11-16 ENCOUNTER — Encounter: Payer: Self-pay | Admitting: Family Medicine

## 2009-11-25 ENCOUNTER — Encounter: Payer: Self-pay | Admitting: Interventional Radiology

## 2009-11-26 ENCOUNTER — Ambulatory Visit (HOSPITAL_COMMUNITY): Admission: RE | Admit: 2009-11-26 | Discharge: 2009-11-26 | Payer: Self-pay | Admitting: Interventional Radiology

## 2009-12-04 ENCOUNTER — Ambulatory Visit: Payer: Self-pay | Admitting: Family Medicine

## 2009-12-08 ENCOUNTER — Encounter: Payer: Self-pay | Admitting: Family Medicine

## 2009-12-09 ENCOUNTER — Ambulatory Visit: Payer: Self-pay | Admitting: Cardiology

## 2009-12-10 ENCOUNTER — Telehealth: Payer: Self-pay | Admitting: Family Medicine

## 2009-12-19 ENCOUNTER — Encounter: Admission: RE | Admit: 2009-12-19 | Discharge: 2009-12-19 | Payer: Self-pay | Admitting: Family Medicine

## 2009-12-23 ENCOUNTER — Encounter (INDEPENDENT_AMBULATORY_CARE_PROVIDER_SITE_OTHER): Payer: Self-pay | Admitting: *Deleted

## 2010-02-20 ENCOUNTER — Encounter: Payer: Self-pay | Admitting: Family Medicine

## 2010-03-22 ENCOUNTER — Encounter: Payer: Self-pay | Admitting: Internal Medicine

## 2010-04-03 NOTE — Progress Notes (Signed)
Summary: doesn't want aggrenox  Phone Note Call from Patient Call back at Home Phone [phone]   Caller: Patient Call For: Hannah Beat MD Summary of Call: Patient has stopped the plavix now. Picked up rx for aggrenox. Patient read side affects. She is not happy that this was called in for her. She says that it has all the same side affects and she was having on the palvix and does not want to start taking it.  She uses [facility]. She says that she would like to speak with you before something else is called in.  Initial call taken by: Melody Comas,  Jul 22, 2009 11:50 AM  Follow-up for Phone Call        She needs anti-platelet medication.  Plavix or Aggrenox would be first choice.  If not this, then Aspirin, coated 81 mg. This is a reasonable alternative.  Please discuss - this is for history of prior ministrokes Follow-up by: Hannah Beat MD,  Jul 22, 2009 12:03 PM  Additional Follow-up for Phone Call Additional follow up Details #1::        Patient advised and she was very happy to start on the aspirin Additional Follow-up by: Benny Lennert CMA (AAMA),  Jul 22, 2009 1:50 PM

## 2010-04-03 NOTE — Progress Notes (Signed)
Summary: daughter has agreed to be gaurdian  Phone Note Other Incoming   Caller: Loman Brooklyn with [employer]   [phone] Summary of Call: Bank rep reports that pt's daughter, Tanda Rockers, has agreed to become pt's gaurdian if pt is declared incompetent.  The attorney that is working on the case is Charlann Boxer, and he may need a letter or some other documentation from you.  Daughter Teresa's phone numbers are [phone] and cell [phone], in case you need to call her. Initial call taken by: Lowella Petties CMA,  October 23, 2009 5:03 PM  Follow-up for Phone Call        I know this case well. I saw the patient on Monday. I do not think I can declare her incompetent at this point.   She has help every day per her report in the home and help driving.   Before I could declare her incompetent, I think a formal neuropsychological memory evaluation or evaluation with dementia / neurology consultant most appropriate.   All of this has to be done with the patient's consent.   I am satisfied with the steps she has taken to get more help in the home. I cannot deem her incompetent at this point. Follow-up by: Hannah Beat MD,  October 23, 2009 5:25 PM  Additional Follow-up for Phone Call Additional follow up Details #1::        [facility] for bank rep to call back.            Lowella Petties CMA  October 24, 2009 9:37 AM  I spoke with bank rep and she said the bank will pull itself out of the situation,  she will put the daughter in a position to choose which way she wants to go with this.   Additional Follow-up by: Lowella Petties CMA,  October 24, 2009 2:18 PM    Additional Follow-up for Phone Call Additional follow up Details #2::    noted. Hannah Beat MD  October 24, 2009 3:54 PM

## 2010-04-03 NOTE — Progress Notes (Signed)
Summary: wants something for pain  Phone Note Call from Patient Call back at Home Phone [phone]   Caller: Patient Call For: Hannah Beat MD Summary of Call: Pt called, said she is in a lot of pain with her hip and has to have something called in to [facility].  She said she is not going to get her aricept script filled unless you give her something for either pain or infection. ( I asked her what kind of infection does she have, she said if she knew she would tell me). Initial call taken by: Lowella Petties CMA,  September 10, 2009 1:04 PM  Follow-up for Phone Call        the patient has no infection.  tramadol 50 mg, 1 by mouth 4 times daily as needed pain, #40, 0 refills. use this sparingly. i will not call in any strong pain medication.  I believe her hip pain is coming from her back, we need to get BP controlled, then she can follow-up with [facility] like we talked about yesterday.  i still recommend that she start the aricept. Follow-up by: Hannah Beat MD,  September 10, 2009 1:26 PM    New/Updated Medications: TRAMADOL HCL 50 MG TABS (TRAMADOL HCL) TAKE ONE TABLET UP TO FOUR TIMES DAILY USE SPARINGLY Prescriptions: TRAMADOL HCL 50 MG TABS (TRAMADOL HCL) TAKE ONE TABLET UP TO FOUR TIMES DAILY USE SPARINGLY  #40 x 0   Entered by:   Benny Lennert CMA (AAMA)   Authorized by:   Hannah Beat MD   Signed by:   Benny Lennert CMA (AAMA) on 09/10/2009   Method used:   Electronically to        [facility]* (retail)       [address]       [place], [place]  [address]       Ph: [phone]       Fax: [phone]   RxID:   [id number]   Prior Medications: ANTIVERT 25 MG TABS (MECLIZINE HCL) 1 by mouth q 6 hours as needed vertigo ZOLOFT 100 MG TABS (SERTRALINE HCL) 1/2 tab by mouth daily GLUCOPHAGE XR 500 MG XR24H-TAB (METFORMIN HCL) 2 tabs by mouth daily TOPROL XL 200 MG XR24H-TAB (METOPROLOL SUCCINATE) 1 by mouth daily CLORAZEPATE DIPOTASSIUM 3.75 MG   TABS (CLORAZEPATE DIPOTASSIUM) 1-2 at bedtime as needed INDOMETHACIN CR 75 MG  CPCR (INDOMETHACIN) Take 1 tablet by mouth once a day SYNTHROID 75 MCG  TABS (LEVOTHYROXINE SODIUM) Take 1 tablet by mouth once a day NEXIUM 40 MG  CPDR (ESOMEPRAZOLE MAGNESIUM) Take 1 tablet by mouth once a day BL STOOL SOFTENER 100 MG  CAPS (DOCUSATE SODIUM) as needed ZANTAC 75 75 MG  TABS (RANITIDINE HCL) Take one by mouth once a day MULTIVITAMINS   TABS (MULTIPLE VITAMIN) Take one by mouth once a day FLUOCINONIDE 0.05 % CREA (FLUOCINONIDE) Apply to rash on arms two times a day SPIRIVA HANDIHALER 18 MCG CAPS (TIOTROPIUM BROMIDE MONOHYDRATE) inhale 1 capsule once daily Current Allergies: ! * METOPROLOL ! VICODIN ! NITROFURANTOIN MACROCRYSTAL (NITROFURANTOIN MACROCRYSTAL) * SULFA (SULFONAMIDES) GROUP * COLD-ON PATCH

## 2010-04-03 NOTE — Assessment & Plan Note (Signed)
Summary: FOLLOW-UP PER DR. Undra Harriman/DS   Vital Signs:  Patient profile:   76 year old female Height:      59 inches Weight:      158.0 pounds BMI:     32.03 Temp:     97.9 degrees F oral Pulse rate:   66 / minute Pulse rhythm:   regular BP sitting:   122 / 82  (left arm) Cuff size:   regular  Vitals Entered By: Benny Lennert CMA Duncan Dull) (June 10, 2009 3:03 PM)  Serial Vital Signs/Assessments:  Time      Position  BP       Pulse  Resp  Temp     By 3:24 PM             165/80                         Hannah Beat MD   History of Present Illness: Chief complaint multiple problems  Abdominal Pain: c/w rather severe abdominal pain. L > R. Not relieved by anything that she is doing.  Dementia:  BP: 160's/70's-80's at home  f/u with Dr. Kinnie Scales - removed some polyps the last time.   DM: Hgb a1c.   Allergies: 1)  ! * Metoprolol 2)  ! Vicodin 3)  ! Nitrofurantoin Macrocrystal (Nitrofurantoin Macrocrystal) 4)  * Sulfa (Sulfonamides) Group 5)  * Cold-On Patch  Past History:  Past medical, surgical, family and social histories (including risk factors) reviewed, and no changes noted (except as noted below).  Past Medical History: Reviewed history from 07/04/2008 and no changes required. Anxiety Colonic polyps, hx of--tubular adenomas COPD Coronary artery disease-small vessel disease Diverticulosis, colon Hypothyroidism DVT, hx of Depression Essential tremor Degenerative disc disease Osteoarthritis GERD Hypertension NIDDM, 9/09 microalbinuria Basal Cell CA, req. Mohs  [facility] Dr Stacey Drain  [phone] Dr Orlin Hilding Dr Maple [facility] Derm: Diona Browner, [facility]. [name] in the past)  Past Surgical History: Reviewed history from 11/22/2007 and no changes required. Appendectomy 1952 Cholecystectomy 1944 Hysterectomy 1963 Ovarian cyst- secondary ventral hematoma 1982 Bladder tacking 1997 Pneumonia x 3 (out pt.) Cataracts/IOL- bilat. Echo- mild LVH, NO  EF 10/04 T12 comp fx, then confusion, ? secondary Vicodin 03/08 kyphoplasty, [facility]  Family History: Reviewed history from 10/13/2006 and no changes required. Father: Died in his 77's, pancreatic cancer Mother: Died in her 65's, CVA  2 Brothers, both deceased, one died at 60, heart, CVA, one died at 39, heart.  One sister died at age 28, bipolar, ESRD CAD and HTN in brothers No DM No breast or colon cancer  Social History: Reviewed history from 11/22/2007 and no changes required.  Widowed Children: 2 Daughters (1 in [place], 1 [address] [place]'s Grandmother Occupation: Retired, [employer] operational officer Enjoys reading/movies/day trips Never Smoked Alcohol use-yes  Review of Systems GI:  Complains of abdominal pain, gas, and indigestion; denies bloody stools and dark tarry stools.   Impression & Recommendations:  Problem # 1:  ABDOMINAL PAIN, LEFT LOWER QUADRANT (ICD-789.04) Assessment New cannot rule out diverticulitis and in elderly will treat with Flagyl and Cipro presumptively  Orders: Gastroenterology Referral (GI)  Problem # 2:  ABDOMINAL DISTENSION (ICD-787.3) Assessment: New  Orders: Gastroenterology Referral (GI)  Problem # 3:  SENILE DEMENTIA, UNCOMPLICATED (ICD-290.0) >45 minutes spent in total face to face time with the patient with >50% of time spent in counselling and coordination of care: extensive amount of time spent in discussion today regarding her memory, pill  taking, care of home, interaction with family. She likely has some mild, minimal dementia. Adamantly opposed to nursing home and will sometimes see her grandchildren. Reviewed her home situation, support structures.  Problem # 4:  DIABETES-TYPE 2 (ICD-250.00)  The following medications were removed from the medication list:    Lisinopril 40 Mg Tabs (Lisinopril) .Marland Kitchen... 1 by mouth once daily Her updated medication list for this problem includes:    Glucophage Xr 500 Mg Xr24h-tab (Metformin hcl)  .Marland Kitchen... 2 tabs by mouth daily  Orders: Venipuncture ([address]) TLB-BMP (Basic Metabolic Panel-BMET) (80048-METABOL) TLB-A1C / Hgb A1C (Glycohemoglobin) (83036-A1C)  Problem # 5:  HYPERTENSION (ICD-401.9) Assessment: Deteriorated Unstable. Requests all branded medications  The following medications were removed from the medication list:    Lisinopril 40 Mg Tabs (Lisinopril) .Marland Kitchen... 1 by mouth once daily    Amlodipine Besylate 10 Mg Tabs (Amlodipine besylate) .Marland Kitchen... 1/2 tab by mouth Her updated medication list for this problem includes:    Toprol Xl 200 Mg Xr24h-tab (Metoprolol succinate) .Marland Kitchen... 1 by mouth daily    Norvasc 10 Mg Tabs (Amlodipine besylate) .Marland Kitchen... 1 by mouth daily  Complete Medication List: 1)  Antivert 25 Mg Tabs (Meclizine hcl) .Marland Kitchen.. 1 by mouth q 6 hours as needed vertigo 2)  Zoloft 100 Mg Tabs (Sertraline hcl) .... 1/2 tab by mouth daily 3)  Glucophage Xr 500 Mg Xr24h-tab (Metformin hcl) .... 2 tabs by mouth daily 4)  Toprol Xl 200 Mg Xr24h-tab (Metoprolol succinate) .Marland Kitchen.. 1 by mouth daily 5)  Clorazepate Dipotassium 3.75 Mg Tabs (Clorazepate dipotassium) .Marland Kitchen.. 1-2 at bedtime as needed 6)  Plavix 75 Mg Tabs (Clopidogrel bisulfate) .... Take one half tablet by mouth once a day 7)  Indomethacin Cr 75 Mg Cpcr (Indomethacin) .... Take 1 tablet by mouth once a day 8)  Synthroid 75 Mcg Tabs (Levothyroxine sodium) .... Take 1 tablet by mouth once a day 9)  Nexium 40 Mg Cpdr (Esomeprazole magnesium) .... Take 1 tablet by mouth once a day 10)  Bl Stool Softener 100 Mg Caps (Docusate sodium) .... As needed 11)  Zantac 75 75 Mg Tabs (Ranitidine hcl) .... Take one by mouth once a day 12)  Multivitamins Tabs (Multiple vitamin) .... Take one by mouth once a day 13)  Spiriva Handihaler 18 Mcg Caps (Tiotropium bromide monohydrate) .... Inhale contents of 1 capsule once a day - must make appt with dr. Maple Hudson for further refills 14)  Fluocinonide 0.05 % Crea (Fluocinonide) .... Apply to rash on arms  two times a day 15)  Ciprofloxacin Hcl 750 Mg Tabs (Ciprofloxacin hcl) .Marland Kitchen.. 1 by mouth two times a day 16)  Metronidazole 500 Mg Tabs (Metronidazole) .Marland Kitchen.. 1 by mouth 4 times daily 17)  Norvasc 10 Mg Tabs (Amlodipine besylate) .Marland Kitchen.. 1 by mouth daily  Patient Instructions: 1)  f/u Dr. Kinnie Scales 2)  f/u 1 month Prescriptions: NORVASC 10 MG TABS (AMLODIPINE BESYLATE) 1 by mouth daily Brand medically necessary #30 x 5   Entered and Authorized by:   Hannah Beat MD   Signed by:   Hannah Beat MD on 06/10/2009   Method used:   Print then Give to Patient   RxID:   [id number] GLUCOPHAGE XR 500 MG XR24H-TAB (METFORMIN HCL) 2 tabs by mouth daily Brand medically necessary #60 x 11   Entered and Authorized by:   Hannah Beat MD   Signed by:   Hannah Beat MD on 06/10/2009   Method used:   Print then Give to Patient   RxID:  [id number] CIPROFLOXACIN HCL 750 MG TABS (CIPROFLOXACIN HCL) 1 by mouth two times a day  #20 x 0   Entered and Authorized by:   Hannah Beat MD   Signed by:   Hannah Beat MD on 06/10/2009   Method used:   Electronically to        [facility]* (retail)       [address]       [place], [place]  [address]       Ph: [phone]       Fax: [phone]   RxID:   [id number] METRONIDAZOLE 500 MG TABS (METRONIDAZOLE) 1 by mouth 4 times daily  #40 x 0   Entered and Authorized by:   Hannah Beat MD   Signed by:   Hannah Beat MD on 06/10/2009   Method used:   Electronically to        [facility]* (retail)       [address]       [place], [place]  [address]       Ph: [phone]       Fax: [phone]   RxID:   [id number] CIPROFLOXACIN HCL 500 MG TABS (CIPROFLOXACIN HCL) 1 by mouth two times a day  #20 x 0   Entered and Authorized by:   Hannah Beat MD   Signed by:   Hannah Beat MD on 06/10/2009   Method used:   Electronically to        [facility]* (retail)       [address]       [place], [place]  [address]        Ph: [phone]       Fax: [phone]   RxID:   [id number]   Current Allergies (reviewed today): ! * METOPROLOL ! VICODIN ! NITROFURANTOIN MACROCRYSTAL (NITROFURANTOIN MACROCRYSTAL) * SULFA (SULFONAMIDES) GROUP * COLD-ON PATCH

## 2010-04-03 NOTE — Assessment & Plan Note (Signed)
Summary: 1 month follow up/rbh   Vital Signs:  Patient profile:   77 year old female Height:      59 inches Weight:      158.13 pounds BMI:     32.05 Temp:     98.2 degrees F oral Pulse rate:   60 / minute Pulse rhythm:   regular BP sitting:   136 / 80  (right arm) Cuff size:   regular  Vitals Entered By: Linde Gillis CMA Duncan Dull) (Jul 15, 2009 3:59 PM) CC: 1 month follow up   History of Present Illness:  BP stable  Increased Glucophage to 2 DM better  ? Plavix   Allergies: 1)  ! * Metoprolol 2)  ! Vicodin 3)  ! Nitrofurantoin Macrocrystal (Nitrofurantoin Macrocrystal) 4)  * Sulfa (Sulfonamides) Group 5)  * Cold-On Patch   Complete Medication List: 1)  Antivert 25 Mg Tabs (Meclizine hcl) .Marland Kitchen.. 1 by mouth q 6 hours as needed vertigo 2)  Zoloft 100 Mg Tabs (Sertraline hcl) .... 1/2 tab by mouth daily 3)  Glucophage Xr 500 Mg Xr24h-tab (Metformin hcl) .... 2 tabs by mouth daily 4)  Toprol Xl 200 Mg Xr24h-tab (Metoprolol succinate) .Marland Kitchen.. 1 by mouth daily 5)  Clorazepate Dipotassium 3.75 Mg Tabs (Clorazepate dipotassium) .Marland Kitchen.. 1-2 at bedtime as needed 6)  Aggrenox 25-200 Mg Xr12h-cap (Aspirin-dipyridamole) .Marland Kitchen.. 1 by mouth daily (do not take before colonoscopy) 7)  Indomethacin Cr 75 Mg Cpcr (Indomethacin) .... Take 1 tablet by mouth once a day 8)  Synthroid 75 Mcg Tabs (Levothyroxine sodium) .... Take 1 tablet by mouth once a day 9)  Nexium 40 Mg Cpdr (Esomeprazole magnesium) .... Take 1 tablet by mouth once a day 10)  Bl Stool Softener 100 Mg Caps (Docusate sodium) .... As needed 11)  Zantac 75 75 Mg Tabs (Ranitidine hcl) .... Take one by mouth once a day 12)  Multivitamins Tabs (Multiple vitamin) .... Take one by mouth once a day 13)  Fluocinonide 0.05 % Crea (Fluocinonide) .... Apply to rash on arms two times a day 14)  Spiriva Handihaler 18 Mcg Caps (Tiotropium bromide monohydrate) .... Inhale 1 capsule once daily  Other Orders: Prescription Created Electronically  [phone])  Patient Instructions: 1)  STOP PLAVIX 2)  START AGGRENOX AFTER YOUR COLONOSCOPY 3)  F/U 3 MONTHS Prescriptions: AGGRENOX 25-200 MG XR12H-CAP (ASPIRIN-DIPYRIDAMOLE) 1 by mouth daily (DO NOT TAKE BEFORE COLONOSCOPY)  #30 x 5   Entered and Authorized by:   Hannah Beat MD   Signed by:   Hannah Beat MD on 07/15/2009   Method used:   Electronically to        [facility]* (retail)       [address]       [place], [place]  [address]       Ph: [phone]       Fax: [phone]   RxID:   [id number]   Current Allergies (reviewed today): ! * METOPROLOL ! VICODIN ! NITROFURANTOIN MACROCRYSTAL (NITROFURANTOIN MACROCRYSTAL) * SULFA (SULFONAMIDES) GROUP * COLD-ON PATCH  Appended Document: 1 month follow up/rbh  I inadvertantly signed chart before doing note.  DM: BS under better control, 100-120. Taking Metformin 500 mg two times a day, did not understand increased dosing.   HTN: stabilized  s/p TIA, on Plavix, held x 7 days now prior to colonoscopy by Dr. Kinnie Scales  ROS: feeling better, occ tired, no fever chills, sweats  .GEN: WDWN, NAD, Non-toxic, A & O x 3 HEENT: Atraumatic, Normocephalic. Neck supple. No  masses, No LAD. Ears and Nose: No external deformity. CV: RRR, No M/G/R. No JVD. No thrill. No extra heart sounds. PULM: CTA B, no wheezes, crackles, rhonchi. No retractions. No resp. distress. No accessory muscle use. EXTR: No c/c/e NEURO: Normal gait.  PSYCH: Normally interactive. Conversant. Not depressed or anxious appearing.  Calm demeanor.    PLAN:  1. DM: last a1c at 7.7. doing better. In this case, difficult to argue for very tight control.  2. s/p TIA: d/c Plavix, patient intermittently compliant and feels better per report off of this this week. Change to Aggrenox, reasonable.  3. HTN, stabilized   Clinical Lists Changes  Orders: Added new Service order of Est. Patient Level III (42595) - Signed

## 2010-04-03 NOTE — Consult Note (Signed)
Summary: [facility]  [facility]   Imported By: Lanelle Bal 03/04/2010 10:21:55  _____________________________________________________________________  External Attachment:    Type:   Image     Comment:   External Document  Appended Document: [facility] Dr. Terrace Arabia, 29/30 MMSE minimal memory loss, able to care for self independently

## 2010-04-03 NOTE — Progress Notes (Signed)
Summary: several issues  Phone Note Call from Patient Call back at Home Phone [phone]   Caller: Patient Call For: Hannah Beat MD Summary of Call: Pt called complaining of nausea every morning.  She wants something for that.  Also she stopped taking two of her BP meds on saturday, after going to the saturday clinic.  She is only taking 100 mg of toprol now.  She is keeping a check on her blood pressure and it's ok.  She made follow up appt to see you on monday ( I put her in a 30 minute slot).  She is unhappy about a lot of things, states her legs are swelling and she thinks she needs an antibiotic for her GI problems, went on about that for awhile.  She wants you to call her, she says you never call her when she asks you to. Initial call taken by: Lowella Petties CMA,  June 06, 2009 3:31 PM  Follow-up for Phone Call        d/w Jacki Cones. Follow-up by: Hannah Beat MD,  June 06, 2009 3:47 PM  Additional Follow-up for Phone Call Additional follow up Details #1::        I called and talked to patient at length.  Abd pain is significant, severe, severe nausea, with some blood in stool.  She has multiple other long-term complaints, but I need her seen tomorrow to evaluate her abdomen. Concern for potential diverticulitis in elderly or other acute process.  She can follow-up with me in the next 1-2 weeks about other issues. I really would someone from her family to come to her f/u appt. with me. Additional Follow-up by: Hannah Beat MD,  June 06, 2009 5:27 PM    Additional Follow-up for Phone Call Additional follow up Details #2::    scheduled appt with Dr. Ermalene Searing per Dr. Patsy Lager. DeShannon Smith CMA Duncan Dull)  June 06, 2009 5:39 PM    Appended Document: several issues Spoke with patient becuase she was late for appt and she said no ever called her back wiht appt time. Patient says that she is not having stomach pain anymore but, says she is nerves and weak and she thinks  that is her main issue know. Offer patient appt with Dr Dayton Martes at 11:00 because we had no more openings and patient wanted to wait and be seen on monday by Dr. Patsy Lager. Patient was scheduled for 30 minute appt with dr copland on Monday April 11th 2011.Consuello Masse CMA

## 2010-04-03 NOTE — Progress Notes (Signed)
  Phone Note Call from Patient Call back at Home Phone [phone]   Caller: Patient Call For: Hannah Beat MD Summary of Call: Patient called and left message on triage phone regarding ?? about her medications. When I returned her call, she answered and said that she was on the other line with someone and asked if I could call her back. I advised her that it would be best if she called our office back when she was available to talk.  Initial call taken by: Melody Comas,  September 18, 2009 2:08 PM

## 2010-04-03 NOTE — Progress Notes (Signed)
Summary: results  Phone Note Call from Patient Call back at Home Phone [phone]   Caller: Patient Call For: Kindsey Eblin Reason for Call: Talk to Nurse, Lab or Test Results Summary of Call: result of cxr Initial call taken by: Eugene Gavia,  Jul 04, 2009 8:58 AM  Follow-up for Phone Call        pt advised of cxr results per append. Carron Curie CMA  Jul 04, 2009 9:07 AM

## 2010-04-03 NOTE — Progress Notes (Signed)
Summary: call a nurse/Blood pressure concerns  Phone Note Call from Patient Call back at Home Phone [phone]   Summary of Call: Triage Record Num: [id number] Operator: April Finney Patient Name: [name] Call Date & Time: 04/22/2009 7:07:55PM Patient Phone: PCP: Karleen Hampshire Copland Patient Gender: Female PCP Fax : [phone] Patient DOB: 07/04/1924 Practice Name: [facility] Reason for Call: Katrina/friend calling about b/p 205/88 and then 204/84. Onset 5hrs ago. Has taken b/p med Toprol as ordered and then took half a pill more 2 hrs ago and then another half ago. No chest pain. No sob. See in 4 hrs care advice given. Going to ED. Protocol(s) Used: Hypertension; Known / Suspected Recommended Outcome per Protocol: See Provider within 4 hours Reason for Outcome: Systolic BP of >180 mmHg OR diastolic BP of >120 mmHg Care Advice:  ~ Another adult should drive. Call EMS 911 if new symptoms develop, such as severe shortness of breath, chest pain, change in mental status, acute neurologic deficit, seizure, visual disturbances, pulse rate > 120 / minute, or very irregular pulse.  ~  ~ Call provider if symptoms worsen or new symptoms develop.  ~ List, or take, all current prescription(s), OTC or alternative medication(s) to provider for evaluation. MEDICATION ADVICE: - Tell provider all prescription, OTC or alternative medications that you take. - Take medications as prescribed, following label instructions for the medication. - Know possible side effects of medication and what to do if they occur. - Do not change medications or dosing regimen until provider is consulted. - Discontinue all OTC and alternative medications, especially stimulants, until evaluated by provider.  ~  ~ HEALTH PROMOTION / MAINTENANCE  ~ CAUTIONS 02/ Initial call taken by: Melody Comas,  April 23, 2009 9:12 AM  Follow-up for Phone Call        Patient wants phone call from Dr.  Brayton Layman nurse. [phone] Follow-up by: Melody Comas,  April 23, 2009 10:10 AM  Additional Follow-up for Phone Call Additional follow up Details #1::        Spoke with patient and blood pressure yesterday was 193/78, 177/75 pulse 62.  197/87, 193/80 pulse 60.  Haven't taken blood pressure medication today at all usually takes it around 10:00 every morning.  Patient says she doesn't feel tried or sluggish like she did yesterday.  Will take her blood pressure medication now and wait to hear from Dr. Ermalene Searing.  Linde Gillis CMA [facility] Dull)  April 23, 2009 10:35 AM     Additional Follow-up for Phone Call Additional follow up Details #2::    Recommend staying on 100 mg daily of BP medication.  Will add additional med.   Saw she was on maxide in the past...why was this stopped (I could not tell) We could restart this or lisinopril if she has not lem with this in past instead. had prob Follow up in next 1-2 weeks with Dr. Patsy Lager for BP check.  Follow-up by: Kerby Nora MD,  April 23, 2009 10:46 AM  Additional Follow-up for Phone Call Additional follow up Details #3:: Details for Additional Follow-up Action Taken: Patient says she was taken off Maxide by a doctor but she doesn't remember which one and doesn't remember what side effects she had or why they took her off of it.  She said she does take 100mg  of Toprol everyday, and she agrees to start a new blood pressure medication if that's what the doctor thinks she needs.  Scheduled her to see Dr. Patsy Lager on  Thursday.  Linde Gillis CMA [facility] Dull)  April 23, 2009 12:02 PM   Start lisinopril 10 mg daily (chose this due to DM).Marland Kitchenkeep follow up appt with Dr. Patsy Lager for BP check.  Notify patient that the below prescription has been sent to the pharmacy electronically. Kerby Nora MD  April 23, 2009 1:28 PM   Patient notified as instructed.   Additional Follow-up by: Linde Gillis CMA (AAMA),  April 23, 2009 2:00 PM  New/Updated  Medications: LISINOPRIL 10 MG TABS (LISINOPRIL) Take 1 tablet by mouth once a day Prescriptions: LISINOPRIL 10 MG TABS (LISINOPRIL) Take 1 tablet by mouth once a day  #30 x 11   Entered and Authorized by:   Kerby Nora MD   Signed by:   Kerby Nora MD on 04/23/2009   Method used:   Electronically to        [facility]* (retail)       [address]       [place], [place]  [address]       Ph: [phone]       Fax: [phone]   RxID:   [id number]    Please call pt..find out how BP control is today. Kerby Nora MD  April 23, 2009 9:14 AM

## 2010-04-03 NOTE — Consult Note (Signed)
Summary: Dr.Peter [facility],Note  Dr.Peter [facility],Note   Imported By: Beau Fanny 11/11/2009 15:11:04  _____________________________________________________________________  External Attachment:    Type:   Image     Comment:   External Document

## 2010-04-03 NOTE — Assessment & Plan Note (Signed)
Summary: 2 WK F/U DLO   Vital Signs:  Patient profile:   77 year old female Height:      59 inches Weight:      159.6 pounds BMI:     32.35 Temp:     97.6 degrees F oral Pulse rate:   68 / minute Pulse rhythm:   regular BP sitting:   146 / 68  (left arm) Cuff size:   regular  Vitals Entered By: Benny Lennert CMA [facility] Dull) (May 16, 2009 4:03 PM)  History of Present Illness: Chief complaint 2 wk follow up BP  On Toprol XL 200 mg Lisinopril 40 mg and Norvasc 5 mg BP readings 130's - 140's systolic and tolerating med changes.  Feels much better   Having some mild memory problems. We discuss again, not ready for any medications, does not want me to call her family.   ROS: no fever, chills, sweats, CP, SOB  GEN: WDWN, NAD, Non-toxic, A & O x 3 HEENT: Atraumatic, Normocephalic. Neck supple. No masses, No LAD. Ears and Nose: No external deformity. CV: RRR, No M/G/R. No JVD. No thrill. No extra heart sounds. PULM: CTA B, no wheezes, crackles, rhonchi. No retractions. No resp. distress. No accessory muscle use. EXTR: No c/c/e NEURO: Normal gait.  PSYCH: Normally interactive. Conversant. Not depressed or anxious appearing.  Calm demeanor.     Allergies: 1)  ! * Metoprolol 2)  ! Vicodin 3)  ! Nitrofurantoin Macrocrystal (Nitrofurantoin Macrocrystal) 4)  * Sulfa (Sulfonamides) Group 5)  * Cold-On Patch   Impression & Recommendations:  Problem # 1:  HYPERTENSION (ICD-401.9) Assessment Improved Increase Norvasc to 10 mg.  Her updated medication list for this problem includes:    Toprol Xl 200 Mg Xr24h-tab (Metoprolol succinate) .Marland Kitchen... 1 by mouth daily    Lisinopril 40 Mg Tabs (Lisinopril) .Marland Kitchen... 1 by mouth once daily    Amlodipine Besylate 10 Mg Tabs (Amlodipine besylate) .Marland Kitchen... 1 by mouth daily  Problem # 2:  SENILE DEMENTIA, UNCOMPLICATED (ICD-290.0) Assessment: New Discussed dementia, i think some Aricept reasonable - patient not ready to accept  Complete  Medication List: 1)  Antivert 25 Mg Tabs (Meclizine hcl) .Marland Kitchen.. 1 by mouth q 6 hours as needed vertigo 2)  Zoloft 100 Mg Tabs (Sertraline hcl) .... 1/2 tab by mouth daily 3)  Glucophage Xr 500 Mg Xr24h-tab (Metformin hcl) .... 2 tabs by mouth daily 4)  Toprol Xl 200 Mg Xr24h-tab (Metoprolol succinate) .Marland Kitchen.. 1 by mouth daily 5)  Clorazepate Dipotassium 3.75 Mg Tabs (Clorazepate dipotassium) .Marland Kitchen.. 1-2 at bedtime as needed 6)  Plavix 75 Mg Tabs (Clopidogrel bisulfate) .... Take one half tablet by mouth once a day 7)  Indomethacin Cr 75 Mg Cpcr (Indomethacin) .... Take 1 tablet by mouth once a day 8)  Synthroid 75 Mcg Tabs (Levothyroxine sodium) .... Take 1 tablet by mouth once a day 9)  Nexium 40 Mg Cpdr (Esomeprazole magnesium) .... Take 1 tablet by mouth once a day 10)  Bl Stool Softener 100 Mg Caps (Docusate sodium) .... As needed 11)  Zantac 75 75 Mg Tabs (Ranitidine hcl) .... Take one by mouth once a day 12)  Multivitamins Tabs (Multiple vitamin) .... Take one by mouth once a day 13)  Spiriva Handihaler 18 Mcg Caps (Tiotropium bromide monohydrate) .... Inhale contents of 1 capsule once a day - must make appt with dr. Maple Hudson for further refills 14)  Fluocinonide 0.05 % Crea (Fluocinonide) .... Apply to rash on arms two times a day  15)  Lisinopril 40 Mg Tabs (Lisinopril) .Marland Kitchen.. 1 by mouth once daily 16)  Amlodipine Besylate 10 Mg Tabs (Amlodipine besylate) .Marland Kitchen.. 1 by mouth daily  Patient Instructions: 1)  KEEP TAKING THE TOPROL AND LISINOPRIL EXACTLY AS BEFORE, 1 TABLET A DAY 2)  INCREASE THE AMLODIPINE TO 10 MG A DAY -- STILL 1 TABLET A DAY Prescriptions: AMLODIPINE BESYLATE 10 MG TABS (AMLODIPINE BESYLATE) 1 by mouth daily  #30 x 11   Entered and Authorized by:   Hannah Beat MD   Signed by:   Hannah Beat MD on 05/16/2009   Method used:   Print then Give to Patient   RxID:   [id number]   Current Allergies (reviewed today): ! * METOPROLOL ! VICODIN ! NITROFURANTOIN MACROCRYSTAL  (NITROFURANTOIN MACROCRYSTAL) * SULFA (SULFONAMIDES) GROUP * COLD-ON PATCH

## 2010-04-03 NOTE — Progress Notes (Signed)
Summary: Rx Clorazepate Dipotassium  Phone Note Refill Request Call back at [phone] Message from:  [facility] on November 05, 2009 12:35 PM  Refills Requested: Medication #1:  CLORAZEPATE DIPOTASSIUM 3.75 MG  TABS 1-2 at bedtime as needed   Last Refilled: 05/02/2009 Received faxed refill request please advise    Method Requested: Electronic Initial call taken by: Linde Gillis CMA [facility] Dull),  November 05, 2009 12:38 PM  Follow-up for Phone Call        Rx called to pharmacy, [facility]. Follow-up by: Linde Gillis CMA [facility] Dull),  November 05, 2009 4:45 PM    Prescriptions: CLORAZEPATE DIPOTASSIUM 3.75 MG  TABS (CLORAZEPATE DIPOTASSIUM) 1-2 at bedtime as needed  #60 x 3   Entered and Authorized by:   Hannah Beat MD   Signed by:   Hannah Beat MD on 11/05/2009   Method used:   Telephoned to ...       [facility]* (retail)             ,          Ph: [phone]       Fax: [phone]   RxID:   [id number]

## 2010-04-03 NOTE — Assessment & Plan Note (Signed)
Summary: 30 MIN F/U PER DR Torri Michalski/[facility]   Vital Signs:  Patient profile:   77 year old female Height:      59 inches Weight:      155.0 pounds BMI:     31.42 Temp:     97.8 degrees F oral Pulse rate:   70 / minute Pulse rhythm:   regular BP sitting:   140 / 74  (left arm) Cuff size:   regular  Vitals Entered By: Benny Lennert CMA Duncan Dull) (December 04, 2009 11:04 AM)  History of Present Illness: Chief complaint: follow up per Jarad Barth  Saw Kerby Nora - sent from Dr. Luiz Blare office. Much damage in the back - epidural.   Eye - kept watering. Something in it - used some drops that are in it. Dr. Gwen Pounds (Optho) Abnormalities - that can have a form of glaucoma.   Primary discussion about memory.  Allergies: 1)  ! * Metoprolol 2)  ! Vicodin 3)  ! Nitrofurantoin Macrocrystal (Nitrofurantoin Macrocrystal) 4)  ! * Aricept 5)  * Sulfa (Sulfonamides) Group 6)  * Cold-On Patch  Past History:  Past medical, surgical, family and social histories (including risk factors) reviewed, and no changes noted (except as noted below).  Past Medical History: Anxiety Colonic polyps, hx of--tubular adenomas COPD Glaucoma, history in past. Coronary artery disease-small vessel disease Diverticulosis, colon Hypothyroidism DVT, hx of Depression Essential tremor Degenerative disc disease Osteoarthritis GERD Hypertension NIDDM, 9/09 microalbinuria Basal Cell CA, req. Mohs  [facility] Dr Stacey Drain  [phone] Dr Orlin Hilding Dr Maple Hudson Dr. Corliss Skains, interventional radialogy Dr. Luiz Blare, Ortho Dr. Gwen Pounds, Opthalmology Derm: Diona Browner, [facility]. [name] in the past)  Past Surgical History: Reviewed history from 10/16/2009 and no changes required. Appendectomy 1952 Cholecystectomy 1944 Hysterectomy 1963 Ovarian cyst- secondary ventral hematoma 1982 Bladder tacking 1997 Pneumonia x 3 (out pt.) Cataracts/IOL- bilat. Echo- mild LVH, normal EF 60-65% 10/04 T12 comp fx,  then confusion, ? secondary Vicodin 03/08 kyphoplasty, [facility]  Family History: Reviewed history from 10/13/2006 and no changes required. Father: Died in his 66's, pancreatic cancer Mother: Died in her 49's, CVA  2 Brothers, both deceased, one died at 57, heart, CVA, one died at 26, heart.  One sister died at age 55, bipolar, ESRD CAD and HTN in brothers No DM No breast or colon cancer  Social History: Reviewed history from 11/22/2007 and no changes required.  Widowed Children: 2 Daughters (1 in [place], [address] Buyer, retail Occupation: Retired, Diplomatic Services operational officer Enjoys reading/movies/day trips Never Smoked Alcohol use-yes   Impression & Recommendations:  Problem # 1:  SENILE DEMENTIA, UNCOMPLICATED (ICD-290.0) >25 minutes spent in face to face time with patient, >50% spent in counselling or coordination of care: actually spent more than an hour face-to-face with the patient almost entirely in counseling and verbal discussion. Again, have continued concerns about her memory. She does not recall prior conversations about multiple medication conversations including Aricept, blood pressure medications, and a platelet medications, and others. All detailed in prior notes. She does not recall discussing with me in full procedures as she has set up with interventional radiology, prior physician appointments and she did discuss with me before. This is not new, and is a recurring pattern noted by multiple physicians and staff in our office. The patient has asked me do not discuss her care with her family or POA. Previously, on one office visit, she did allow me to talk about her care with one of her daughters and her  financial POA, but since reversed.  the patient did bring her helper in today with her, who does help and confirmed that the patient does keep a neat house, is able to cook for herself, her laundry, and otherwise do her activities of daily living per report. She  also states that she feels comfortable riding with the patient in automobile. She assists in driving and comes 5 days a week.  This history contrasts with what her daughter mentioned to me on a prior conversation. Her daughter also sent a letter to our office detailing concerns about her mother's memory and concerns about her driving. I think that the safest course of action is to contact the DMV, and I have found their contact information through the hospital system. Additionally, I believe that the patient needs formal neurological evaluation to definitively evaluate her memory to define cause, assist, and define level of impairment.  The patient's daughter suggests a memory care unit - but I think more properly assisted living level care of benefit.  Orders: Neurology Referral (Neuro)  Problem # 2:  SENILE DEMENTIA, UNCOMPLICATED (ICD-290.0)  Orders: Neurology Referral (Neuro)  Complete Medication List: 1)  Antivert 25 Mg Tabs (Meclizine hcl) .Marland Kitchen.. 1 by mouth q 6 hours as needed vertigo 2)  Zoloft 100 Mg Tabs (Sertraline hcl) .... 1/2 tab by mouth daily 3)  Glucophage Xr 500 Mg Xr24h-tab (Metformin hcl) .... 2 tabs by mouth daily 4)  Toprol Xl 200 Mg Xr24h-tab (Metoprolol succinate) .Marland Kitchen.. 1 by mouth daily 5)  Clorazepate Dipotassium 3.75 Mg Tabs (Clorazepate dipotassium) .Marland Kitchen.. 1-2 at bedtime as needed 6)  Indomethacin Cr 75 Mg Cpcr (Indomethacin) .... Take 1 tablet by mouth once a day 7)  Synthroid 75 Mcg Tabs (Levothyroxine sodium) .... Take 1 tablet by mouth once a day 8)  Nexium 40 Mg Cpdr (Esomeprazole magnesium) .... Take 1 tablet by mouth once a day 9)  Bl Stool Softener 100 Mg Caps (Docusate sodium) .... As needed 10)  Zantac 75 75 Mg Tabs (Ranitidine hcl) .... Take one by mouth once a day 11)  Multivitamins Tabs (Multiple vitamin) .... Take one by mouth once a day 12)  Fluocinonide 0.05 % Crea (Fluocinonide) .... Apply to rash on arms two times a day 13)  Spiriva Handihaler 18 Mcg  Caps (Tiotropium bromide monohydrate) .... Inhale 1 capsule once daily 14)  Aspir-low 81 Mg Tbec (Aspirin) .Marland Kitchen.. 1 by mouth daily 15)  Furosemide 20 Mg Tabs (Furosemide) .... 1/2 tablet in am for fluid 16)  Norvasc 5 Mg Tabs (Amlodipine besylate) .... Take one tablet daily  Patient Instructions: 1)  Referral Appointment Information 2)  Day/Date: 3)  Time: 4)  Place/MD: 5)  Address: 6)  Phone/Fax: 7)  Patient given appointment information. Information/Orders faxed/mailed.   Current Allergies (reviewed today): ! * METOPROLOL ! VICODIN ! NITROFURANTOIN MACROCRYSTAL (NITROFURANTOIN MACROCRYSTAL) ! * ARICEPT * SULFA (SULFONAMIDES) GROUP * COLD-ON PATCH

## 2010-04-03 NOTE — Assessment & Plan Note (Signed)
Summary: BP check per Dr. Macy Mis   Vital Signs:  Patient profile:   77 year old female Height:      59 inches Weight:      158.6 pounds BMI:     32.15 Temp:     98.2 degrees F oral Pulse rate:   68 / minute Pulse rhythm:   regular BP sitting:   150 / 80  (left arm) Cuff size:   regular  Vitals Entered By: Benny Lennert CMA Duncan Dull) (April 25, 2009 3:27 PM)  Serial Vital Signs/Assessments:  Time      Position  BP       Pulse  Resp  Temp     By 3:50 PM             180/90                         Hannah Beat MD   History of Present Illness: Chief complaint BP per Dr. Ermalene Searing  BP: the patient has recently had some exceedingly high blood pressures. she recently went to the dermatologist office, and was measured at 240 systolic.  Additionally, she did also have some blood pressure readings on her home machine that were  in the 200s.  She call the nursing call line, and  Monday she went to the emergency room.  Dr. Ermalene Searing called in some lisinopril 10 mg and the patient filled yesterday.  Sunday she felt really weak and had to sit down for a while.  Has a couple of skin cancers - seeing Derm.  Sunday and Monday morning and blood pressure was high.   Current Problems (verified): 1)  Encounter For Long-term Use of Other Medications  (ICD-V58.69) 2)  Vertigo  (ICD-780.4) 3)  Diabetes-type 2  (ICD-250.00) 4)  Unspecified Anemia  (ICD-285.9) 5)  Positive Ppd  (ICD-795.5) 6)  Hypertension  (ICD-401.9) 7)  Osteoarthritis  (ICD-715.90) 8)  Tremor, Essential  (ICD-333.1) 9)  Hiatal Hernia With Reflux  (ICD-553.3) 10)  Degenerative Disc Disease, Back  (ICD-722.6) 11)  Depression  (ICD-311) 12)  Dvt, Hx Of, X 2, Left Leg  (ICD-V12.51) 13)  Hypothyroidism  (ICD-244.9) 14)  Diverticulosis, Colon  (ICD-562.10) 15)  Coronary Artery Disease  (ICD-414.00) 16)  COPD  (ICD-496) 17)  Colonic Polyps, Hx of  (ICD-V12.72) 18)  Anxiety  (ICD-300.00) 19)  Sleep Disorder   (ICD-780.50)  Allergies: 1)  ! * Metoprolol 2)  ! Vicodin 3)  ! Nitrofurantoin Macrocrystal (Nitrofurantoin Macrocrystal) 4)  * Sulfa (Sulfonamides) Group 5)  * Cold-On Patch  Past History:  Past medical, surgical, family and social histories (including risk factors) reviewed, and no changes noted (except as noted below).  Past Medical History: Reviewed history from 07/04/2008 and no changes required. Anxiety Colonic polyps, hx of--tubular adenomas COPD Coronary artery disease-small vessel disease Diverticulosis, colon Hypothyroidism DVT, hx of Depression Essential tremor Degenerative disc disease Osteoarthritis GERD Hypertension NIDDM, 9/09 microalbinuria Basal Cell CA, req. Mohs  [facility] Dr Stacey Drain  [phone] Dr [address]lin H[address] Dr [facility]: Diona Browner, [facility]. [name] in the past)  Past Surgical History: Reviewed history from 11/22/2007 and no changes required. Appendectomy 1952 Cholecystectomy 1944 Hysterectomy 1963 Ovarian cyst- secondary ventral hematoma 1982 Bladder tacking 1997 Pneumonia x 3 (out pt.) Cataracts/IOL- bilat. Echo- mild LVH, NO EF 10/04 T12 comp fx, then confusion, ? secondary Vicodin 03/08 kyphoplasty, [facility]  Family History: Reviewed history from 10/13/2006 and no changes required. Father: Died in his 31's, pancreatic  cancer Mother: Died in her 55's, CVA  2 Brothers, both deceased, one died at 96, heart, CVA, one died at 32, heart.  One sister died at age 43, bipolar, ESRD CAD and HTN in brothers No DM No breast or colon cancer  Social History: Reviewed history from 11/22/2007 and no changes required.  Widowed Children: 2 Daughters (1 in [place], 1 [address] [place]'s Grandmother Occupation: Retired, Diplomatic Services operational officer Enjoys reading/movies/day trips Never Smoked Alcohol use-yes  Review of Systems General:  Complains of fatigue and weakness; denies chills and fever. CV:  Denies chest pain or discomfort  and shortness of breath with exertion. Resp:  Denies cough and shortness of breath.  Physical Exam  General:  Well-developed,well-nourished,in no acute distress; alert,appropriate and cooperative throughout examination Head:  Normocephalic and atraumatic without obvious abnormalities. No apparent alopecia or balding. Ears:  no external deformities.   Nose:  no external deformity.   Neck:  No deformities, masses, or tenderness noted. Lungs:  normal respiratory effort, no intercostal retractions, no accessory muscle use, and normal breath sounds.   Heart:  normal rate, regular rhythm, and no murmur.   Extremities:  No clubbing, cyanosis, edema, or deformity noted with normal full range of motion of all joints.   Neurologic:  alert & oriented X3 and gait normal.   Cervical Nodes:  No lymphadenopathy noted Psych:  Cognition and judgment appear intact. Alert and cooperative with normal attention span and concentration. No apparent delusions, illusions, hallucinations   Impression & Recommendations:  Problem # 1:  HYPERTENSION (ICD-401.9) Assessment Deteriorated Dangerously out of control - reviewed with the importance of medication compliance. With systolics 180-200, I feel comfortable increasing both Toprol and Lisinopril. If she gets lightheaded, we will have to increase 1, then likely the other in a stepwise fashion  My reading of 180/90 is in line with her recent readings and I suspect is the correct BP today.  recheck 1 week.  Her updated medication list for this problem includes:    Toprol Xl 200 Mg Xr24h-tab (Metoprolol succinate) .Marland Kitchen... 1 by mouth daily    Lisinopril 20 Mg Tabs (Lisinopril) .Marland Kitchen... 1 by mouth daily  Complete Medication List: 1)  Antivert 25 Mg Tabs (Meclizine hcl) .Marland Kitchen.. 1 by mouth q 6 hours as needed vertigo 2)  Zoloft 100 Mg Tabs (Sertraline hcl) .... 1/2 tab by mouth daily 3)  Glucophage Xr 500 Mg Xr24h-tab (Metformin hcl) .... 2 tabs by mouth daily 4)  Toprol  Xl 200 Mg Xr24h-tab (Metoprolol succinate) .Marland Kitchen.. 1 by mouth daily 5)  Clorazepate Dipotassium 3.75 Mg Tabs (Clorazepate dipotassium) .Marland Kitchen.. 1-2 at bedtime as needed 6)  Plavix 75 Mg Tabs (Clopidogrel bisulfate) .... Take one half tablet by mouth once a day 7)  Indomethacin Cr 75 Mg Cpcr (Indomethacin) .... Take 1 tablet by mouth once a day 8)  Synthroid 75 Mcg Tabs (Levothyroxine sodium) .... Take 1 tablet by mouth once a day 9)  Nexium 40 Mg Cpdr (Esomeprazole magnesium) .... Take 1 tablet by mouth once a day 10)  Bl Stool Softener 100 Mg Caps (Docusate sodium) .... As needed 11)  Zantac 75 75 Mg Tabs (Ranitidine hcl) .... Take one by mouth once a day 12)  Multivitamins Tabs (Multiple vitamin) .... Take one by mouth once a day 13)  Spiriva Handihaler 18 Mcg Caps (Tiotropium bromide monohydrate) .... Inhale contents of 1 capsule once a day - must make appt with dr. Maple [place] for further refills 14)  Fluocinonide 0.05 % Crea (  Fluocinonide) .... Apply to rash on arms two times a day 15)  Zocor 40 Mg Tabs (Simvastatin) .... Take on tablet at bedtime 16)  Lisinopril 20 Mg Tabs (Lisinopril) .Marland Kitchen.. 1 by mouth daily  Patient Instructions: 1)  Take the higher dose of Toprol at 200 mg 2)  Take Lisinopril 20 mg. (TAKE TWO TABLETS OF THE 10 MG THAT YOU HAVE RIGHT NOW) 3)  Recheck with me in 1 week Prescriptions: TOPROL XL 200 MG XR24H-TAB (METOPROLOL SUCCINATE) 1 by mouth daily Brand medically necessary #30 x 5   Entered and Authorized by:   Hannah Beat MD   Signed by:   Hannah Beat MD on 04/25/2009   Method used:   Historical   RxID:   [id number]   Current Allergies (reviewed today): ! * METOPROLOL ! VICODIN ! NITROFURANTOIN MACROCRYSTAL (NITROFURANTOIN MACROCRYSTAL) * SULFA (SULFONAMIDES) GROUP * COLD-ON PATCH

## 2010-04-03 NOTE — Assessment & Plan Note (Signed)
Summary: bad stomach pain//vgj   Vital Signs:  Patient profile:   77 year old female Height:      59 inches Weight:      159 pounds BMI:     32.23 O2 Sat:      96 % on Room air Temp:     97.0 degrees F oral Pulse rate:   66 / minute BP sitting:   164 / 84  (left arm) Cuff size:   regular  Vitals Entered By: Payton Spark CMA (June 01, 2009 12:35 PM)  O2 Flow:  Room air CC: Stomach bloating and discomfort, nausea, diarrhea x 2 days. Vomited 1x on Thurs.    Primary Care Provider:  Hannah Beat MD  CC:  Stomach bloating and discomfort, nausea, and diarrhea x 2 days. Vomited 1x on Thurs. .  History of Present Illness: 77 yo Wf presents for problems with her HTN.  she is c/o abdominal bloating that started on Wed.  She started to having nausea on and off.  She has COPD and GERD.  She has been coughing more.   She is on Spriva.  she is on Nexium daily which helps.  She is upset about how many meds she is taking.  She thinks she takes too many meds. She thinks that she has an 'infection' in her intestines due to some bloating and an occastional loose stool.  Denies blood in stool .  Has mild nausea.    Allergies: 1)  ! * Metoprolol 2)  ! Vicodin 3)  ! Nitrofurantoin Macrocrystal (Nitrofurantoin Macrocrystal) 4)  * Sulfa (Sulfonamides) Group 5)  * Cold-On Patch  Past History:  Past Medical History: Reviewed history from 07/04/2008 and no changes required. Anxiety Colonic polyps, hx of--tubular adenomas COPD Coronary artery disease-small vessel disease Diverticulosis, colon Hypothyroidism DVT, hx of Depression Essential tremor Degenerative disc disease Osteoarthritis GERD Hypertension NIDDM, 9/09 microalbinuria Basal Cell CA, req. Mohs  [facility] Dr Stacey Drain  [phone] Dr Orlin Hilding Dr [facility] D[facility]: Diona Browner, [facility]. [name] in the past)  Social History: Reviewed history from 11/22/2007 and no changes required.  Widowed Children: 2  Daughters (1 in [place], 1 [address] [place]'s Grandmother Occupation: Retired, Diplomatic Services operational officer Enjoys reading/movies/day trips Never Smoked Alcohol use-yes  Review of Systems      See HPI  Physical Exam  General:  obese elderly WF in NAD Head:  normocephalic and atraumatic.   Eyes:  sclera non icteric Mouth:  pharynx pink and moist.   Neck:  no masses.   Lungs:  Normal respiratory effort, chest expands symmetrically. Lungs are clear to auscultation, no crackles or wheezes. Heart:  normal rate, regular rhythm, and no murmur.  no gallop and no rub.   Abdomen:  soft, NT/ND.  NABS.   Extremities:  1+ pitting LE edema Skin:  color normal.   Cervical Nodes:  No lymphadenopathy noted Psych:  slightly anxious and judgment fair.     Impression & Recommendations:  Problem # 1:  ABDOMINAL DISTENSION (ICD-787.3) Unclear etiology.  She is difficult to get a straight hx on and it is apparent that she is not taking her meds listed.  I cut her Amlodopine from 10--> 5 mg/ day due to fluid retention.  She can use Pepto Bismol as needed and she can f/u with Dr Patsy Lager on Monday.  Complete Medication List: 1)  Antivert 25 Mg Tabs (Meclizine hcl) .Marland Kitchen.. 1 by mouth q 6 hours as needed vertigo 2)  Zoloft 100 Mg  Tabs (Sertraline hcl) .... 1/2 tab by mouth daily 3)  Glucophage Xr 500 Mg Xr24h-tab (Metformin hcl) .... 2 tabs by mouth daily 4)  Toprol Xl 200 Mg Xr24h-tab (Metoprolol succinate) .Marland Kitchen.. 1 by mouth daily 5)  Clorazepate Dipotassium 3.75 Mg Tabs (Clorazepate dipotassium) .Marland Kitchen.. 1-2 at bedtime as needed 6)  Plavix 75 Mg Tabs (Clopidogrel bisulfate) .... Take one half tablet by mouth once a day 7)  Indomethacin Cr 75 Mg Cpcr (Indomethacin) .... Take 1 tablet by mouth once a day 8)  Synthroid 75 Mcg Tabs (Levothyroxine sodium) .... Take 1 tablet by mouth once a day 9)  Nexium 40 Mg Cpdr (Esomeprazole magnesium) .... Take 1 tablet by mouth once a day 10)  Bl Stool Softener 100 Mg Caps  (Docusate sodium) .... As needed 11)  Zantac 75 75 Mg Tabs (Ranitidine hcl) .... Take one by mouth once a day 12)  Multivitamins Tabs (Multiple vitamin) .... Take one by mouth once a day 13)  Spiriva Handihaler 18 Mcg Caps (Tiotropium bromide monohydrate) .... Inhale contents of 1 capsule once a day - must make appt with dr. [facility] [place] for further refills 14)  Fluocinonide 0.05 % Crea (Fluocinonide) .... Apply to rash on arms two times a day 15)  Lisinopril 40 Mg Tabs (Lisinopril) .Marland Kitchen.. 1 by mouth once daily 16)  Amlodipine Besylate 10 Mg Tabs (Amlodipine besylate) .... 1/2 tab by mouth  Patient Instructions: 1)  Take OTC Pepto bismol as needed for abdominal distress. 2)  Cut Amlodopine in 1/2 due to fluid retention. 3)  F/U with Dr Patsy Lager on Monday.

## 2010-04-03 NOTE — Miscellaneous (Signed)
Summary: Orders Update pft charges  Clinical Lists Changes  Orders: Added new Service order of Carbon Monoxide diffusing w/capacity (94720) - Signed Added new Service order of Lung Volumes (94240) - Signed Added new Service order of Spirometry (Pre & Post) (94060) - Signed 

## 2010-04-03 NOTE — Progress Notes (Signed)
Summary: call a nurse  Phone Note Call from Patient   Caller: Patient Call For: Hannah Beat MD Summary of Call: Triage Record Num: [id number] Operator: Freddie Breech Patient Name: [facility] Call Date & Time: 06/01/2009 10:34:57AM Patient Phone: PCP: Karleen Hampshire Katriana Dortch Patient Gender: Female PCP Fax : [phone] Patient DOB: 22-Dec-1924 Practice Name: [facility] Reason for Call: C/o abd pain and nausea x 1 wk. Abd is so sore, "I feel like I'm full of infection." Trans to [facility] for an appt today. Protocol(s) Used: Abdominal Pain / Discomfort Recommended Outcome per Protocol: See ED Immediately Override Outcome if Used in Protocol: See Provider Immediately RN Reason for Override Outcome: Office Is Open. Reason for Outcome: Generalized abdominal pain that progresses to localized pain AND any of the following: loss of appetite, vomiting starting after pain, any fever, OR unable to carry out normal activities Care Advice:  ~ Another adult should drive. 06/01/2009 10:57:22AM Page 1 of 1 [email or web address] Initial call taken by: Melody Comas,  June 03, 2009 9:22 AM

## 2010-04-03 NOTE — Assessment & Plan Note (Signed)
Summary: CHECK BP/CLE   Vital Signs:  Patient profile:   77 year old female Height:      59 inches Weight:      152.0 pounds BMI:     30.81 Temp:     97.7 degrees F oral Pulse rate:   60 / minute Pulse rhythm:   regular BP sitting:   150 / 80  (left arm) Cuff size:   regular  Vitals Entered By: Benny Lennert CMA Duncan Dull) (September 09, 2009 12:41 PM)  History of Present Illness: Chief complaint check BP  77 year old female:  Went up to [facility], has had some epidural steroid injections. he takes a lot of piece together the history, but apparently she went to go for orthopedics, and then she had some high blood pressures there, and she also right now is having some severe back pain.  She were to try to have a round of epidural steroid injections, but they wanted her to get her blood pressure under better control.  152/82, on my recheck, and the patient has written down blood pressures at her proximal at 210/90. I looked at her bottles, she is missed at least 5-7 days of Toprol, and she is currently only on this now.  She's had some recurrent issues in the past  with either high blood pressures are low blood pressures..  Dementia:I concern is his deteriorated, and we discussed this relatively awake, she is getting some assistance now and has someone coming in to help her do her cleaning, and also has someone else coming in  to help her with  some of her bookkeeping.  She did not drive herself here to day and at this point she is effectively only driving [place] the [facility] [place]. She has not gotten lost walking, and she is not  gotten  lost driving at all. She has minimal family support at home  Allergies: 1)  ! * Metoprolol 2)  ! Vicodin 3)  ! Nitrofurantoin Macrocrystal (Nitrofurantoin Macrocrystal) 4)  * Sulfa (Sulfonamides) Group 5)  * Cold-On Patch  Past History:  Past medical, surgical, family and social histories (including risk factors) reviewed, and no changes noted  (except as noted below).  Past Medical History: Reviewed history from 07/04/2008 and no changes required. Anxiety Colonic polyps, hx of--tubular adenomas COPD Coronary artery disease-small vessel disease Diverticulosis, colon Hypothyroidism DVT, hx of Depression Essential tremor Degenerative disc disease Osteoarthritis GERD Hypertension NIDDM, 9/09 microalbinuria Basal Cell CA, req. Mohs  [facility] Dr Stacey Drain  [phone] Dr Orlin H[address]ing Dr [facility]: Diona Browner, [facility]. [name] in the past)  Past Surgical History: Reviewed history from 11/22/2007 and no changes required. Appendectomy 1952 Cholecystectomy 1944 Hysterectomy 1963 Ovarian cyst- secondary ventral hematoma 1982 Bladder tacking 1997 Pneumonia x 3 (out pt.) Cataracts/IOL- bilat. Echo- mild LVH, NO EF 10/04 T12 comp fx, then confusion, ? secondary Vicodin 03/08 kyphoplasty, [facility]  Family History: Reviewed history from 10/13/2006 and no changes required. Father: Died in his 74's, pancreatic cancer Mother: Died in her 48's, CVA  2 Brothers, both deceased, one died at 72, heart, CVA, one died at 85, heart.  One sister died at age 8, bipolar, ESRD CAD and HTN in brothers No DM No breast or colon cancer  Social History: Reviewed history from 11/22/2007 and no changes required.  Widowed Children: 2 Daughters (1 in [place], 1 [address] [place]'s Grandmother Occupation: Retired, Diplomatic Services operational officer Enjoys reading/movies/day trips Never Smoked Alcohol use-yes  Review of Systems  See HPI General:  Complains of fatigue; denies chills, fever, and sweats. CV:  Denies chest pain or discomfort.  Physical Exam  General:  Well-developed,well-nourished,in no acute distress; alert,appropriate and cooperative throughout examination Head:  Normocephalic and atraumatic without obvious abnormalities. No apparent alopecia or balding. Ears:  no external deformities.   Nose:  no external  deformity.   Lungs:  Normal respiratory effort, chest expands symmetrically. Lungs are clear to auscultation, no crackles or wheezes. Heart:  Normal rate and regular rhythm. S1 and S2 normal without gallop, murmur, click, rub or other extra sounds. Cervical Nodes:  No lymphadenopathy noted Psych:  normally interactive and good eye contact.     Impression & Recommendations:  Problem # 1:  SENILE DEMENTIA, UNCOMPLICATED (ICD-290.0) Assessment Deteriorated I'm worried that the primary reason she is having his blood pressure fluctuations is her memory. I am starting Aricept, and again talked with her,  however under the circumstances, given acute pains, certainly keep pain exacerbation could make her blood pressure higher, and she has also been taking some additional ibuprofen.  Orders: Prescription Created Electronically [phone])  Problem # 2:  HYPERTENSION (ICD-401.9) Assessment: Deteriorated start norvasc as well  h/o TIA, refuses Plavix and Aggrenox, but will agree to take ASA  Her updated medication list for this problem includes:    Toprol Xl 200 Mg Xr24h-tab (Metoprolol succinate) .Marland Kitchen... 1 by mouth daily    Norvasc 10 Mg Tabs (Amlodipine besylate) .Marland Kitchen... 1 by mouth daily  Complete Medication List: 1)  Antivert 25 Mg Tabs (Meclizine hcl) .Marland Kitchen.. 1 by mouth q 6 hours as needed vertigo 2)  Zoloft 100 Mg Tabs (Sertraline hcl) .... 1/2 tab by mouth daily 3)  Glucophage Xr 500 Mg Xr24h-tab (Metformin hcl) .... 2 tabs by mouth daily 4)  Toprol Xl 200 Mg Xr24h-tab (Metoprolol succinate) .Marland Kitchen.. 1 by mouth daily 5)  Clorazepate Dipotassium 3.75 Mg Tabs (Clorazepate dipotassium) .Marland Kitchen.. 1-2 at bedtime as needed 6)  Indomethacin Cr 75 Mg Cpcr (Indomethacin) .... Take 1 tablet by mouth once a day 7)  Synthroid 75 Mcg Tabs (Levothyroxine sodium) .... Take 1 tablet by mouth once a day 8)  Nexium 40 Mg Cpdr (Esomeprazole magnesium) .... Take 1 tablet by mouth once a day 9)  Bl Stool Softener 100 Mg Caps  (Docusate sodium) .... As needed 10)  Zantac 75 75 Mg Tabs (Ranitidine hcl) .... Take one by mouth once a day 11)  Multivitamins Tabs (Multiple vitamin) .... Take one by mouth once a day 12)  Fluocinonide 0.05 % Crea (Fluocinonide) .... Apply to rash on arms two times a day 13)  Spiriva Handihaler 18 Mcg Caps (Tiotropium bromide monohydrate) .... Inhale 1 capsule once daily 14)  Norvasc 10 Mg Tabs (Amlodipine besylate) .Marland Kitchen.. 1 by mouth daily 15)  Aricept 5 Mg Tabs (Donepezil hydrochloride) .Marland Kitchen.. 1 by mouth daily 16)  Aspir-low 81 Mg Tbec (Aspirin) .Marland Kitchen.. 1 by mouth daily  Patient Instructions: 1)  f/u 2-3 weeks Prescriptions: ARICEPT 5 MG TABS (DONEPEZIL HYDROCHLORIDE) 1 by mouth daily Brand medically necessary #30 x 2   Entered and Authorized by:   Hannah Beat MD   Signed by:   Hannah Beat MD on 09/09/2009   Method used:   Electronically to        [facility]* (retail)       [address]       [place], [place]  [address]       Ph: [phone]       Fax: [phone]  RxID:   [id number]     Current Allergies (reviewed today): ! * METOPROLOL ! VICODIN ! NITROFURANTOIN MACROCRYSTAL (NITROFURANTOIN MACROCRYSTAL) * SULFA (SULFONAMIDES) GROUP * COLD-ON PATCH

## 2010-04-03 NOTE — Progress Notes (Signed)
Summary: regarding plavix  Phone Note Call from Patient   Caller: Patient Call For: Hannah Beat MD Summary of Call: Pt is having a colonoscopy with Dr. Kinnie Scales on 5/19 and she needs to know how many days prior does she need to stop the plavix. Initial call taken by: Lowella Petties CMA,  Jul 01, 2009 10:14 AM  Follow-up for Phone Call        5 days ok to hold  Sent note to Dr. Kinnie Scales already Follow-up by: Hannah Beat MD,  Jul 01, 2009 10:15 AM  Additional Follow-up for Phone Call Additional follow up Details #1::        patient advised.Consuello Masse CMA  Additional Follow-up by: Benny Lennert CMA Duncan Dull),  Jul 01, 2009 12:15 PM

## 2010-04-03 NOTE — Assessment & Plan Note (Signed)
Summary: [facility]- PULM STRESS TEST   Nurse Visit   Vital Signs:  Patient profile:   77 year old female Pulse rate:   51 / minute BP sitting:   118 / 80  Medications Prior to Update: 1)  Antivert 25 Mg Tabs (Meclizine Hcl) .Marland Kitchen.. 1 By Mouth Q 6 Hours As Needed Vertigo 2)  Zoloft 100 Mg Tabs (Sertraline Hcl) .... 1/2 Tab By Mouth Daily 3)  Glucophage Xr 500 Mg Xr24h-Tab (Metformin Hcl) .... 2 Tabs By Mouth Daily 4)  Toprol Xl 200 Mg Xr24h-Tab (Metoprolol Succinate) .Marland Kitchen.. 1 By Mouth Daily 5)  Clorazepate Dipotassium 3.75 Mg  Tabs (Clorazepate Dipotassium) .Marland Kitchen.. 1-2 At Bedtime As Needed 6)  Aggrenox 25-200 Mg Xr12h-Cap (Aspirin-Dipyridamole) .Marland Kitchen.. 1 By Mouth Daily (Do Not Take Before Colonoscopy) 7)  Indomethacin Cr 75 Mg  Cpcr (Indomethacin) .... Take 1 Tablet By Mouth Once A Day 8)  Synthroid 75 Mcg  Tabs (Levothyroxine Sodium) .... Take 1 Tablet By Mouth Once A Day 9)  Nexium 40 Mg  Cpdr (Esomeprazole Magnesium) .... Take 1 Tablet By Mouth Once A Day 10)  Bl Stool Softener 100 Mg  Caps (Docusate Sodium) .... As Needed 11)  Zantac 75 75 Mg  Tabs (Ranitidine Hcl) .... Take One By Mouth Once A Day 12)  Multivitamins   Tabs (Multiple Vitamin) .... Take One By Mouth Once A Day 13)  Fluocinonide 0.05 % Crea (Fluocinonide) .... Apply To Rash On Arms Two Times A Day 14)  Spiriva Handihaler 18 Mcg Caps (Tiotropium Bromide Monohydrate) .... Inhale 1 Capsule Once Daily  Allergies: 1)  ! * Metoprolol 2)  ! Vicodin 3)  ! Nitrofurantoin Macrocrystal (Nitrofurantoin Macrocrystal) 4)  * Sulfa (Sulfonamides) Group 5)  * Cold-On Patch  Orders Added: 1)  Pulmonary Stress (6 min walk) [94620]   Six Minute Walk Test Medications taken before test(dose and time):  8)  Synthroid 75 Mcg  Tabs (Levothyroxine Sodium) .... Take 1 Tablet By Mouth Once A Day- pt took this at 6:45 9)  Nexium 40 Mg  Cpdr (Esomeprazole Magnesium) .... Take 1 Tablet By Mouth Once A Day- took at 7:30 am 10)  Bl Stool Softener 100 Mg   Caps (Docusate Sodium) .... As Needed-pt took at 7:30am  Supplemental oxygen during the test: No  Lap counter(place a tick mark inside a square for each lap completed) lap 1 complete  lap 2 complete   lap 3 complete   lap 4 complete  lap 5 complete   Baseline  BP sitting: 118/ 80 Heart rate: 51 Dyspnea ( Borg scale) 1 Fatigue (Borg scale) 0 SPO2 98  End Of Test  BP sitting: 122/ 80 Heart rate: 67 Dyspnea ( Borg scale) 3 Fatigue (Borg scale) 4 SPO2 95  2 Minutes post  BP sitting: 120/ 78 Heart rate: 54 SPO2 97  Stopped or paused before six minutes? No  Interpretation: Number of laps  5 X 48 meters =   240 meters+ final partial lap: 38 meters =    278 meters   Total distance walked in six minutes: 278 meters  Tech ID: Tivis Ringer, CNA (Jul 23, 2009 12:58 PM) Tech Comments Pt completed test w/ 0 rest breaks and 1 complaint: "feeling unbalanced". says she normally feels this way and takes a rx for "something similar to vertigo". no other complaints.

## 2010-04-03 NOTE — Letter (Signed)
Summary: CMN for Diabetes Supplies/[facility]  CMN for Diabetes Supplies/[facility]   Imported By: Lanelle Bal 11/06/2009 11:51:58  _____________________________________________________________________  External Attachment:    Type:   Image     Comment:   External Document

## 2010-04-03 NOTE — Letter (Signed)
Summary: Medical Clearance/[facility]  Medical Clearance/[facility]   Imported By: Lanelle Bal 07/09/2009 11:39:51  _____________________________________________________________________  External Attachment:    Type:   Image     Comment:   External Document  Appended Document: Medical Clearance/[facility] letter already sent

## 2010-04-03 NOTE — Progress Notes (Signed)
Summary: bank rep is working through paperwork  Phone Note Other Incoming   Caller: Benedetto Goad with [employer]   [phone] Summary of Call: Rep from [employer] called to let you know that he is working on the situation with the pt, is  processing through the paper work. Initial call taken by: Lowella Petties CMA,  October 10, 2009 12:55 PM  Follow-up for Phone Call        attempted call. Ramzey Petrovic MD  October 14, 2009 5:16 PM   Spoke to this gentleman, and he directed me to this other individual at [employer]. Donnette Draper [phone] Hannah Beat MD  October 15, 2009 3:18 PM   spoke in significant detail with the patient POA, about her potential harm, confusion and, and concerns that I have about her, my staff has, and her daughter has. We'll reinitiate contact with the patient and her family, and in the back in touch with me. Follow-up by: Hannah Beat MD,  October 15, 2009 4:35 PM

## 2010-04-03 NOTE — Progress Notes (Signed)
Summary: wants copies of notes  Phone Note Call from Patient Call back at Home Phone [phone]   Caller: Patient Call For: [name] Summary of Call: Pt is requesting copies of her office visit notes from the last 3 months, as well as a copy of a letter that you told her you had received.  She knows you are out till thursday, please call when ready. Initial call taken by: Lowella Petties CMA,  December 10, 2009 10:40 AM  Follow-up for Phone Call        Not in office today - will be in tomorrow. [name]  December 11, 2009 9:11 AM   Pt asks that this letter be ready around lunchtime.  Please call when ready and she will pick up.  She says she really needs this letter tomorrow.                      Lowella Petties CMA  December 11, 2009 4:42 PM   Additional Follow-up for Phone Call Additional follow up Details #1::        Herbert Seta has gotten packet together for her. All notes, communication since 08/30/2009. [name]  December 12, 2009 9:33 AM   Everything should be in packet - call that ready [name]  December 12, 2009 9:44 AM     Additional Follow-up for Phone Call Additional follow up Details #2::    Patient advised and will pick packet up in a little while Follow-up by: Benny Lennert CMA Duncan Dull),  December 12, 2009 9:53 AM

## 2010-04-03 NOTE — Progress Notes (Signed)
Summary: stopped aricept and tramadol  Phone Note Call from Patient Call back at Home Phone [phone]   Caller: Patient Call For: Hannah Beat MD Summary of Call: Pt states she was started on 2 new meds- tramadol and aricept.  She stopped taking these 2 nights ago.  She was having strange dreams.  She doesnt want to try anything else. Initial call taken by: Lowella Petties CMA,  September 19, 2009 9:50 AM  Follow-up for Phone Call        stop those medications as discussed with [name]. Follow-up by: Hannah Beat MD,  September 19, 2009 9:52 AM

## 2010-04-03 NOTE — Assessment & Plan Note (Signed)
Summary: ROA FOR FOLLOW-UP ABOUT MEDICATION/JRR   Vital Signs:  Patient profile:   77 year old female Height:      59 inches Weight:      154.0 pounds BMI:     31.22 Temp:     97.9 degrees F oral Pulse rate:   70 / minute Pulse rhythm:   regular BP sitting:   140 / 70  (left arm) Cuff size:   regular  Vitals Entered By: Benny Lennert CMA Duncan Dull) (October 21, 2009 3:19 PM)  History of Present Illness: Chief complaint 3 month follow up   CHF: slightly elevated BNP on recent lab work. In conjunction with edema, patient does have followup with cardiology upcoming within the next week.  Edema: took 1/2 a tab of norvasc, and also took 10 mg of some Lasix, and had prompt diuresis  Every day, coming from [place]. Three hours a day.  she has someone who is coming in either 3 or 4 hours a day every day per her report at this time. They're also willing, and able to do driving for her.  And has another friend who used to work at [employer].    Already has an appt with Peter [place] , upcoming this Friday.  Dementia: Patient still has poor recall, and is confused about some of the things we have discussed in the past. I clearly remember discussing with her starting Aricept, lisinopril, multiple other medications, and she does not fully recall these conversations.    Allergies: 1)  ! * Metoprolol 2)  ! Vicodin 3)  ! Nitrofurantoin Macrocrystal (Nitrofurantoin Macrocrystal) 4)  ! * Aricept 5)  * Sulfa (Sulfonamides) Group 6)  * Cold-On Patch  Past History:  Past medical, surgical, family and social histories (including risk factors) reviewed, and no changes noted (except as noted below).  Past Medical History: Reviewed history from 10/16/2009 and no changes required. Anxiety Colonic polyps, hx of--tubular adenomas COPD Coronary artery disease-small vessel disease Diverticulosis, colon Hypothyroidism DVT, hx of Depression Essential tremor Degenerative disc  disease Osteoarthritis GERD Hypertension NIDDM, 9/09 microalbinuria Basal Cell CA, req. Mohs  [facility] Dr Stacey Drain  [phone] Dr Orlin H[address]ing Dr [place] Dr. Corliss Skains ortho? Derm: Diona Browner, [facility]. [name] in the past)  Past Surgical History: Reviewed history from 10/16/2009 and no changes required. Appendectomy 1952 Cholecystectomy 1944 Hysterectomy 1963 Ovarian cyst- secondary ventral hematoma 1982 Bladder tacking 1997 Pneumonia x 3 (out pt.) Cataracts/IOL- bilat. Echo- mild LVH, normal EF 60-65% 10/04 T12 comp fx, then confusion, ? secondary Vicodin 03/08 kyphoplasty, [facility]  Family History: Reviewed history from 10/13/2006 and no changes required. Father: Died in his 73's, pancreatic cancer Mother: Died in her 24's, CVA  2 Brothers, both deceased, one died at 62, heart, CVA, one died at 34, heart.  One sister died at age 79, bipolar, ESRD CAD and HTN in brothers No DM No breast or colon cancer  Social History: Reviewed history from 11/22/2007 and no changes required.  Widowed Children: 2 Daughters (1 in [place], 1 [address] [place]'s Grandmother Occupation: Retired, [employer] Services operational officer Enjoys reading/movies/day trips Never Smoked Alcohol use-yes  Review of Systems      See HPI General:  Denies chills and fever. CV:  Denies chest pain or discomfort and swelling of feet.  Physical Exam  General:  Well-developed,well-nourished,in no acute distress; alert,appropriate and cooperative throughout examination Head:  Normocephalic and atraumatic without obvious abnormalities. No apparent alopecia or balding. Ears:  no external deformities.   Nose:  no external deformity.   Mouth:  pharynx pink and moist.   Neck:  No deformities, masses, or tenderness noted. Lungs:  Normal respiratory effort, chest expands symmetrically. Lungs are clear to auscultation, no crackles or wheezes. Heart:  Normal rate and regular rhythm. S1 and S2 normal without  gallop, murmur, click, rub or other extra sounds. Extremities:  No clubbing, cyanosis, edema, or deformity noted with normal full range of motion of all joints.   Neurologic:  alert & oriented X3 and gait normal.   Psych:  normally interactive and memory impairment.     Impression & Recommendations:  Problem # 1:  LEG EDEMA, BILATERAL (ICD-782.3) Assessment Improved use Lasix p.r.n. only.  Her updated medication list for this problem includes:    Furosemide 20 Mg Tabs (Furosemide) .Marland Kitchen... 1/2 tablet in am for fluid  Problem # 2:  HYPERTENSION (ICD-401.9) edema has resolved. Resolved, and the patient never actually stopped her Norvasc. Think is reasonable we'll continue this, along with her other medications for blood pressure  She has been intolerant to ACE inhibitors in the past, and has quit them on her own. Subjective, she did not feel well on it.  The following medications were removed from the medication list:    Lisinopril 5 Mg Tabs (Lisinopril) ..... One daily for blood pressure Her updated medication list for this problem includes:    Toprol Xl 200 Mg Xr24h-tab (Metoprolol succinate) .Marland Kitchen... 1 by mouth daily    Furosemide 20 Mg Tabs (Furosemide) .Marland Kitchen... 1/2 tablet in am for fluid    Norvasc 5 Mg Tabs (Amlodipine besylate) .Marland Kitchen... Take one tablet daily  Problem # 3:  SENILE DEMENTIA, UNCOMPLICATED (ICD-290.0) Assessment: Unchanged >25 minutes spent in face to face time with patient, >50% spent in counselling or coordination of care: more than half of encounter spent in discussion of this. I still think that this is her primary issue by far. The patient was upset with made to contact her daughter, although I did have her permission to do so. I discussed with her daughter Rosey Bath that I had some concerns about this patient, discussed that she would need some more help, discussed and she would need help driving. The patient expressed anger about this discussion with her daughter. Ultimately  discussed all these things with her power of attorney as well.  Complete Medication List: 1)  Antivert 25 Mg Tabs (Meclizine hcl) .Marland Kitchen.. 1 by mouth q 6 hours as needed vertigo 2)  Zoloft 100 Mg Tabs (Sertraline hcl) .... 1/2 tab by mouth daily 3)  Glucophage Xr 500 Mg Xr24h-tab (Metformin hcl) .... 2 tabs by mouth daily 4)  Toprol Xl 200 Mg Xr24h-tab (Metoprolol succinate) .Marland Kitchen.. 1 by mouth daily 5)  Clorazepate Dipotassium 3.75 Mg Tabs (Clorazepate dipotassium) .Marland Kitchen.. 1-2 at bedtime as needed 6)  Indomethacin Cr 75 Mg Cpcr (Indomethacin) .... Take 1 tablet by mouth once a day 7)  Synthroid 75 Mcg Tabs (Levothyroxine sodium) .... Take 1 tablet by mouth once a day 8)  Nexium 40 Mg Cpdr (Esomeprazole magnesium) .... Take 1 tablet by mouth once a day 9)  Bl Stool Softener 100 Mg Caps (Docusate sodium) .... As needed 10)  Zantac 75 75 Mg Tabs (Ranitidine hcl) .... Take one by mouth once a day 11)  Multivitamins Tabs (Multiple vitamin) .... Take one by mouth once a day 12)  Fluocinonide 0.05 % Crea (Fluocinonide) .... Apply to rash on arms two times a day 13)  Spiriva Handihaler 18 Mcg Caps (Tiotropium bromide  monohydrate) .... Inhale 1 capsule once daily 14)  Aspir-low 81 Mg Tbec (Aspirin) .Marland Kitchen.. 1 by mouth daily 15)  Furosemide 20 Mg Tabs (Furosemide) .... 1/2 tablet in am for fluid 16)  Norvasc 5 Mg Tabs (Amlodipine besylate) .... Take one tablet daily  Patient Instructions: 1)  f/u with Dr. Sharen Hones to establish care, 2-3 weeks 2)  30 minute appointment  Current Allergies (reviewed today): ! * METOPROLOL ! VICODIN ! NITROFURANTOIN MACROCRYSTAL (NITROFURANTOIN MACROCRYSTAL) ! * ARICEPT * SULFA (SULFONAMIDES) GROUP * COLD-ON PATCH

## 2010-04-03 NOTE — Letter (Signed)
Summary: Results Follow up Letter  [place] [facility]  [address] [place], [place] [address]   Phone: [phone]  Fax: [phone]    12/23/2009 MRN: [id number]     DESSIREE SZE [address] [place], [place]  [address]    Dear Ms. Janis,  The following are the results of your recent test(s):  Test         Result    Pap Smear:        Normal _____  Not Normal _____ Comments: ______________________________________________________ Cholesterol: LDL(Bad cholesterol):         Your goal is less than:         HDL (Good cholesterol):       Your goal is more than: Comments:  ______________________________________________________ Mammogram:        Normal __X___  Not Normal _____ Comments:Repeat in 1 year  ___________________________________________________________________ Hemoccult:        Normal _____  Not normal _______ Comments:    _____________________________________________________________________ Other Tests:    We routinely do not discuss normal results over the telephone.  If you desire a copy of the results, or you have any questions about this information we can discuss them at your next office visit.   Sincerely,   Hannah Beat MD

## 2010-04-03 NOTE — Procedures (Signed)
Summary: Colonoscopy/[facility]  Colonoscopy/[facility]   Imported By: Lanelle Bal 08/13/2009 10:15:42  _____________________________________________________________________  External Attachment:    Type:   Image     Comment:   External Document  Appended Document: Colonoscopy/[facility]     Clinical Lists Changes  Observations: Added new observation of COLONNXTDUE: Not Indicated (08/18/2009 14:10) Added new observation of LST COLON DT: 07/12/2009 (07/12/2009 14:11) Added new observation of COLONOSCOPY: normal (07/12/2009 14:11)      Last Colonoscopy:  Done (05/31/2004 1:56:39 PM) Colonoscopy Result Date:  07/12/2009 Colonoscopy Result:  normal Colonoscopy Next Due:  Not Indicated

## 2010-04-03 NOTE — Letter (Signed)
Summary: Letter with Patient Concerns  Letter with Patient Concerns   Imported By: Lanelle Bal 12/19/2009 09:39:03  _____________________________________________________________________  External Attachment:    Type:   Image     Comment:   External Document

## 2010-04-03 NOTE — Progress Notes (Signed)
Summary: indomethacin  Phone Note Refill Request Message from:  Fax from Pharmacy on August 26, 2009 11:02 AM  Refills Requested: Medication #1:  INDOMETHACIN CR 75 MG  CPCR Take 1 tablet by mouth once a day [facility]  Phone:   [phone]   Method Requested: Electronic Initial call taken by: Delilah Shan CMA (AAMA),  August 26, 2009 11:02 AM    Prescriptions: INDOMETHACIN CR 75 MG  CPCR (INDOMETHACIN) Take 1 tablet by mouth once a day  #30 Capsule x 5   Entered and Authorized by:   Hannah Beat MD   Signed by:   Hannah Beat MD on 08/26/2009   Method used:   Electronically to        [facility]* (retail)       [address]       [place], [place]  [address]       Ph: [phone]       Fax: [phone]   RxID:   [id number]

## 2010-04-03 NOTE — Progress Notes (Signed)
Summary: daughter requests phone call  Phone Note Call from Patient   Caller: Daughter Tanda Rockers  [phone] Summary of Call: Pt's daughter is asking that you call her today, after 5 if you can.  She has some things she wants to discuss. Initial call taken by: Lowella Petties CMA,  October 07, 2009 12:07 PM  Follow-up for Phone Call        Rosalva Ferron, [phone] Donalynn Furlong, [phone] [employer] Power of Attorneys  Discussed with daughter. Ms. Fryberger not cooperating, does not want to go to [facility] or have any additional assistance. Hannah Beat MD  October 07, 2009 5:31 PM  Follow-up by: Hannah Beat MD,  October 07, 2009 5:24 PM  Additional Follow-up for Phone Call Additional follow up Details #1::        f/u call received. Additional Follow-up by: Hannah Beat MD,  October 10, 2009 12:59 PM

## 2010-04-03 NOTE — Assessment & Plan Note (Signed)
Summary: ankles are swelling/alc   Vital Signs:  Patient profile:   77 year old female Weight:      156.25 pounds O2 Sat:      95 % on Room air Temp:     98.3 degrees F oral Pulse rate:   64 / minute Pulse rhythm:   regular BP sitting:   142 / 70  (left arm) Cuff size:   regular  Vitals Entered By: Selena Batten Dance CMA Duncan Dull) (October 16, 2009 11:42 AM)  O2 Flow:  Room air CC: Bilat. ankle edema   History of Present Illness: CC: ankle swelling  77 yo with HTN, HLD, DM, hypothyroid, COPD and dementia presents with complaint of ankle swelling.  Started noticing ankles swelling last week.  yesterday was on her feet good amount, noticed feet swollen worse in evening.  Sleeping on right side.  + mild SOB this AM with walk to clinic from parking lot.  Sleeps with 2 pillows at night (routine).  No orthopnea or PND.  No chest pain or tightness, no fevers;chills, no redness or pain in legs.    States has CHF and has been told heart enlarged (review of EMR and old paper chart only showing mild LVH and normal EF 60-65% 2004, no mention of CHF hx).   Did start norvasc last month.  3 separate aids coming daily to help pt.  Reviewing previous visits, concern with progressing Alz disease.  Pt did drive here today alone.  Says has 2 daughters, one nearby and one in [place].  Says [place] daughter is on her side while other daughter, is not.  Feels daughters cannot take care of her and doesn't want them to.  States daughter is no longer her POA, and bank has that power.  Cards Dr. Sw[place].  Hasn't seen in  ~7 years.  Knows all names of specialists and medical history in detail.  Meds reviewed via pt recall.  Allergies: 1)  ! * Metoprolol 2)  ! Vicodin 3)  ! Nitrofurantoin Macrocrystal (Nitrofurantoin Macrocrystal) 4)  ! * Aricept 5)  * Sulfa (Sulfonamides) Group 6)  * Cold-On Patch  Past History:  Past Medical History: Anxiety Colonic polyps, hx of--tubular adenomas COPD Coronary artery  disease-small vessel disease Diverticulosis, colon Hypothyroidism DVT, hx of Depression Essential tremor Degenerative disc disease Osteoarthritis GERD Hypertension NIDDM, 9/09 microalbinuria Basal Cell CA, req. Mohs  [facility]ULTANT[facility] Dr Stacey Drain  [phone] Dr Orlin Hilding Dr [place] Dr. Corliss Skains ortho? Derm: Diona Browner, [facility]. [name] in the past)  Past Surgical History: Appendectomy 1952 Cholecystectomy 1944 Hysterectomy 1963 Ovarian cyst- secondary ventral hematoma 1982 Bladder tacking 1997 Pneumonia x 3 (out pt.) Cataracts/IOL- bilat. Echo- mild LVH, normal EF 60-65% 10/04 T12 comp fx, then confusion, ? secondary Vicodin 03/08 kyphoplasty, [facility] PMH-FH-SH reviewed for relevance  Review of Systems       per HPI  Physical Exam  General:  Well-developed,well-nourished,in no acute distress; alert,appropriate and cooperative throughout examination Neck:  no evident JVD Lungs:  crackles bibasilarly bilaterally.  no wheezing or rhonchi.  good air movement.  normal WOB Heart:  Normal rate and regular rhythm. S1 and S2 normal without gallop, murmur, click, rub or other extra sounds. Abdomen:  soft, NT/ND.  NABS.   Pulses:  2+ DP/PT Extremities:  2+ pitting LE edema from dorsal feet to mid calf, mild edema up to knees Neurologic:  alert & oriented X3, memory intact for medical history, some confusion with meds. Skin:  color normal.   Psych:  normally interactive and good eye contact.     Impression & Recommendations:  Problem # 1:  DYSPNEA (ICD-786.05) normalish echo 2004.  however clinically fluid overloaded.  treat with ACEI for presumed CHF and lasix for fluid.  Blood work to eval CHF vs other reasons for dsypnea.  If BNP elevated, will need echo.  will notify patient of blood work results.   Orders: TLB-BMP (Basic Metabolic Panel-BMET) (80048-METABOL) TLB-CBC Platelet - w/Differential (85025-CBCD) TLB-BNP (B-Natriuretic Peptide) (83880-BNPR)  Problem  # 2:  LEG EDEMA, BILATERAL (ICD-782.3) Norvasc induced vs new CHF exac.  treat for both.  stop norvasc, start lasix and lisinopril.  recheck with Dr. Patsy Lager next week, sooner if worsening.  will need Cr rechecked in a few weeks  Her updated medication list for this problem includes:    Furosemide 20 Mg Tabs (Furosemide) .Marland Kitchen... 1/2 tablet in am for fluid  Problem # 3:  HYPOTHYROIDISM (ICD-244.9) Check TSH given new sxs. Her updated medication list for this problem includes:    Synthroid 75 Mcg Tabs (Levothyroxine sodium) .Marland Kitchen... Take 1 tablet by mouth once a day  Orders: TLB-TSH (Thyroid Stimulating Hormone) (84443-TSH)  Labs Reviewed: TSH: 2.45 (01/07/2009)   Total T4: 7.3 (01/07/2009)    HgBA1c: 7.7 (06/10/2009) Chol: 176 (09/29/2006)   HDL: 46.8 (09/29/2006)   LDL: 105 (09/29/2006)   TG: 120 (09/29/2006)  Complete Medication List: 1)  Antivert 25 Mg Tabs (Meclizine hcl) .Marland Kitchen.. 1 by mouth q 6 hours as needed vertigo 2)  Zoloft 100 Mg Tabs (Sertraline hcl) .... 1/2 tab by mouth daily 3)  Glucophage Xr 500 Mg Xr24h-tab (Metformin hcl) .... 2 tabs by mouth daily 4)  Toprol Xl 200 Mg Xr24h-tab (Metoprolol succinate) .Marland Kitchen.. 1 by mouth daily 5)  Clorazepate Dipotassium 3.75 Mg Tabs (Clorazepate dipotassium) .Marland Kitchen.. 1-2 at bedtime as needed 6)  Indomethacin Cr 75 Mg Cpcr (Indomethacin) .... Take 1 tablet by mouth once a day 7)  Synthroid 75 Mcg Tabs (Levothyroxine sodium) .... Take 1 tablet by mouth once a day 8)  Nexium 40 Mg Cpdr (Esomeprazole magnesium) .... Take 1 tablet by mouth once a day 9)  Bl Stool Softener 100 Mg Caps (Docusate sodium) .... As needed 10)  Zantac 75 75 Mg Tabs (Ranitidine hcl) .... Take one by mouth once a day 11)  Multivitamins Tabs (Multiple vitamin) .... Take one by mouth once a day 12)  Fluocinonide 0.05 % Crea (Fluocinonide) .... Apply to rash on arms two times a day 13)  Spiriva Handihaler 18 Mcg Caps (Tiotropium bromide monohydrate) .... Inhale 1 capsule once  daily 14)  Aspir-low 81 Mg Tbec (Aspirin) .Marland Kitchen.. 1 by mouth daily 15)  Lisinopril 5 Mg Tabs (Lisinopril) .... One daily for blood pressure 16)  Furosemide 20 Mg Tabs (Furosemide) .... 1/2 tablet in am for fluid  Patient Instructions: 1)  Return next week on Monday or Tuesday for follow up with Dr. Dallas Schimke. 2)  Your swelling may either be from the norvasc or the heart failure. 3)  Stop Norvasc.  If blood pressure is too high, you can restart 1/2 tablet of norvasc daily along with below medicines. 4)  Start Lisinopril 5mg  daily as well as Lasix 10mg  daily in AM. 5)  Blood work today to check congestive heart failure. 6)  Depending what blood work shows, we may get an ultrasound of the heart. 7)  Pleasure to meet you today.  Let us know if not getting better as expected. Prescriptions: FUROSEMIDE 20 MG TABS (FUROSEMIDE) 1/2 tablet  in am for fluid  #30 x 1   Entered and Authorized by:   Eustaquio Boyden  MD   Signed by:   Eustaquio Boyden  MD on 10/16/2009   Method used:   Electronically to        [facility]* (retail)       [address]       [place], [place]  [address]       Ph: [phone]       Fax: [phone]   RxID:   [id number] LISINOPRIL 5 MG TABS (LISINOPRIL) one daily for blood pressure  #30 x 3   Entered and Authorized by:   Eustaquio Boyden  MD   Signed by:   Eustaquio Boyden  MD on 10/16/2009   Method used:   Electronically to        [facility]* (retail)       [address]       [place], [place]  [address]       Ph: [phone]       Fax: [phone]   RxID:   [id number]   Current Allergies (reviewed today): ! * METOPROLOL ! VICODIN ! NITROFURANTOIN MACROCRYSTAL (NITROFURANTOIN MACROCRYSTAL) ! * ARICEPT * SULFA (SULFONAMIDES) GROUP * COLD-ON PATCH

## 2010-04-03 NOTE — Assessment & Plan Note (Signed)
Summary: f/u for refills///kp   Primary Provider/Referring Provider:  Hannah Beat MD  CC:  Followup for refills on spiriva.  Pt last seen May 2009.  She c/o increased SOB with exertion x 6 months.  She is unable to do any chores arounf her home due to getting out of breath.  She also states that on occ she "spits up blood".  .  History of Present Illness: 07/04/07- [name] returns for one month follow-up with history of dyspnea, COPD, and DVT.  She has old granulomatous scarring on chest x-ray and a positive PPD skin test. the pollen season, now is causing only a little watery nose.  The humidity bothers her.  She is short of breath walking up slopes with her husband.  She can sleep and breathe comfortably while supine.  She had had a chest x-ray at her primary office in January.  This showed borderline cardiomegaly and bronchitis.  we reviewed her pulmonary function test from April 8.  This showed normal spirometry flows without response to bronchodilator, normal lung volumes, moderately reduced diffusion capacity.on a 6 minute walk test.  She went 280 m with normal cardiopulmonary response.  Oxygen saturations were 96% 95% and 96%.  Jul 02, 2009- COPD, Dyspnea, hx DVT, hx pos PPD She complains of dyspnea and recognizes her weight contributes. Had a gastroenteritis in the Spring. Aware she has an enlarged heart. PFT in 2009 showed normal airflow with DLCO 49%. She had oxygenated well on a 6 MWT. Spiriva helps if used regularly. In the last 3 months has had nasal congestion. She wheezes bending over, but breathing doesn't wake her. Denies recent heart trouble- stays on Plavix. Had exercise stress test- Dr [place] had to stop itearly but she doesn't know interpretation. Left foot swells more than right, with hx DVT in the left.       Current Medications (verified): 1)  Antivert 25 Mg Tabs (Meclizine Hcl) .Marland Kitchen.. 1 By Mouth Q 6 Hours As Needed Vertigo 2)  Zoloft 100 Mg Tabs (Sertraline Hcl)  .... 1/2 Tab By Mouth Daily 3)  Glucophage Xr 500 Mg Xr24h-Tab (Metformin Hcl) .... 2 Tabs By Mouth Daily 4)  Toprol Xl 200 Mg Xr24h-Tab (Metoprolol Succinate) .Marland Kitchen.. 1 By Mouth Daily 5)  Clorazepate Dipotassium 3.75 Mg  Tabs (Clorazepate Dipotassium) .Marland Kitchen.. 1-2 At Bedtime As Needed 6)  Plavix 75 Mg  Tabs (Clopidogrel Bisulfate) .Marland Kitchen.. 1 Oncce Daily As Directed 7)  Indomethacin Cr 75 Mg  Cpcr (Indomethacin) .... Take 1 Tablet By Mouth Once A Day 8)  Synthroid 75 Mcg  Tabs (Levothyroxine Sodium) .... Take 1 Tablet By Mouth Once A Day 9)  Nexium 40 Mg  Cpdr (Esomeprazole Magnesium) .... Take 1 Tablet By Mouth Once A Day 10)  Bl Stool Softener 100 Mg  Caps (Docusate Sodium) .... As Needed 11)  Zantac 75 75 Mg  Tabs (Ranitidine Hcl) .... Take One By Mouth Once A Day 12)  Multivitamins   Tabs (Multiple Vitamin) .... Take One By Mouth Once A Day 13)  Fluocinonide 0.05 % Crea (Fluocinonide) .... Apply To Rash On Arms Two Times A Day 14)  Metronidazole 500 Mg Tabs (Metronidazole) .Marland Kitchen.. 1 By Mouth 4 Times Daily 15)  Norvasc 10 Mg Tabs (Amlodipine Besylate) .Marland Kitchen.. 1 By Mouth Daily  Allergies (verified): 1)  ! * Metoprolol 2)  ! Vicodin 3)  ! Nitrofurantoin Macrocrystal (Nitrofurantoin Macrocrystal) 4)  * Sulfa (Sulfonamides) Group 5)  * Cold-On Patch  Past History:  Past Medical History:  Last updated: 07/04/2008 Anxiety Colonic polyps, hx of--tubular adenomas COPD Coronary artery disease-small vessel disease Diverticulosis, colon Hypothyroidism DVT, hx of Depression Essential tremor Degenerative disc disease Osteoarthritis GERD Hypertension NIDDM, 9/09 microalbinuria Basal Cell CA, req. Mohs  [facility] Dr Stacey Drain  [phone] Dr Orlin Hilding Dr [place] Derm: Diona Browner, [facility]. [name] in the past)  Past Surgical History: Last updated: 11/22/2007 Appendectomy 1952 Cholecystectomy 1944 Hysterectomy 1963 Ovarian cyst- secondary ventral hematoma 1982 Bladder tacking  1997 Pneumonia x 3 (out pt.) Cataracts/IOL- bilat. Echo- mild LVH, NO EF 10/04 T12 comp fx, then confusion, ? secondary Vicodin 03/08 kyphoplasty, [facility]  Family History: Last updated: 2006-11-05 Father: Died in his 50's, pancreatic cancer Mother: Died in her 71's, CVA  2 Brothers, both deceased, one died at 53, heart, CVA, one died at 94, heart.  One sister died at age 67, bipolar, ESRD CAD and HTN in brothers No DM No breast or colon cancer  Social History: Last updated: 11/22/2007  Widowed Children: 2 Daughters (1 in [place], 1 [address] Fraz[place]'s Grandmother Occupation: Retired, [employer] operational officer Enjoys reading/movies/day trips Never Smoked Alcohol use-yes  Risk Factors: Smoking Status: never (11-05-2006)  Review of Systems      See HPI       The patient complains of dyspnea on exertion and peripheral edema.  The patient denies anorexia, fever, weight loss, weight gain, vision loss, decreased hearing, hoarseness, chest pain, syncope, prolonged cough, headaches, hemoptysis, abdominal pain, and severe indigestion/heartburn.    Vital Signs:  Patient profile:   77 year old female Weight:      164 pounds O2 Sat:      95 % on Room air Pulse rate:   53 / minute BP sitting:   118 / 70  (left arm)  Vitals Entered By: Vernie Murders (Jul 02, 2009 3:23 PM)  O2 Flow:  Room air  Physical Exam  Additional Exam:  General: A/Ox3; pleasant and cooperative, NAD, talkative, 95% sat on room air SKIN: no rash, lesions NODES: no lymphadenopathy HEENT: [place]/AT, EOM- WNL, Conjuctivae- clear, PERRLA, TM-WNL, Nose- clear, Throat- clear and wnl, Mallampati  II NECK: Supple w/ fair ROM, JVD- none, normal carotid impulses w/o bruits Thyroid-  CHEST: few bibasilar crackles HEART: RRR, no m/g/r heard ABDOMEN: Soft and nl;  AVW:UJWJ, nl pulses, 1+ edema left ankle NEURO: Grossly intact to observation      Impression & Recommendations:  Problem # 1:  COPD (ICD-496) PFT  didn't show obstructive change. The reduced DLCO may be from heart disease or old pulmonary embolic disease. We will update her status with CXR and updated PFT.  Medications Added to Medication List This Visit: 1)  Plavix 75 Mg Tabs (Clopidogrel bisulfate) .Marland Kitchen.. 1 oncce daily as directed 2)  Spiriva Handihaler 18 Mcg Caps (Tiotropium bromide monohydrate) .... Inhale 1 capsule once daily  Other Orders: Est. Patient Level III (19147) T-2 View CXR (71020TC)  Patient Instructions: 1)  Please schedule a follow-up appointment in 1 month. 2)  Schedule PFT with 6 MWT 3)  A chest x-ray has been recommended.  Your imaging study may require preauthorization.  Prescriptions: SPIRIVA HANDIHALER 18 MCG CAPS (TIOTROPIUM BROMIDE MONOHYDRATE) inhale 1 capsule once daily  #30 x 5   Entered by:   Reynaldo Minium CMA   Authorized by:   Waymon Budge MD   Signed by:   Reynaldo Minium CMA on 07/02/2009   Method used:   Electronically to        [facility]* (retail)       [address]       [place], [place]  [address]       Ph: [phone]       Fax: [phone]   RxID:   [id number]

## 2010-04-03 NOTE — Letter (Signed)
Summary: Generic Letter  [place] [facility]  [address] [place], [place] [address]   Phone: [phone]  Fax: [phone]    06/27/2009  [name] [address] [name], [place]  [address]  Dear Dr. Kinnie Scales,     It is reasonable for this patient to stop her Plavix for 7 days before colonoscopy.      Sincerely,   Hannah Beat MD

## 2010-04-03 NOTE — Progress Notes (Signed)
Summary: wants phone call from dr. Patsy Lager  Phone Note Call from Patient   Caller: Patient Call For: Hannah Beat MD Summary of Call: Patient says that she wants phone call from Dr. Patsy Lager. She says that this morning she had bm and there was some blood in her stool. She says that then she went to grocery store and when she got home and went to bathroom she had blood in her underwear about the size of quarter. She says that it hurt a little when she had her bm this morning, but mostly hurt in her lower back. She wants phone call from Dr. because she is really worried.  Initial call taken by: Melody Comas,  May 10, 2009 3:59 PM  Follow-up for Phone Call        [place], please call. Hannah Beat MD,  May 10, 2009 4:19 PM  left message on machine for patient to return call.  DeShannon Smith CMA Duncan Dull)  May 10, 2009 4:21 PM   Additional Follow-up for Phone Call Additional follow up Details #1::        Spoke with patient.  She had one harder than normal BM this morning but not more.  She has not seen any more blood in her underware.  Advised patient to drink plenty, try not to strain when she has a BM.  She says that she has been using suppossitories and she will start to drink more water because she has not been.  Advised patient that if her symptoms worsen over the weekend to go to urgent care or the ER.  She has a f/u appt with Dr. Patsy Lager on Thursday.   Additional Follow-up by: Linde Gillis CMA Duncan Dull),  May 10, 2009 5:02 PM

## 2010-04-03 NOTE — Letter (Signed)
Summary: Medical Form for Driver Re-Exam/NCDMV  Medical Form for Driver Re-Exam/NCDMV   Imported By: Lanelle Bal 12/17/2009 10:35:18  _____________________________________________________________________  External Attachment:    Type:   Image     Comment:   External Document

## 2010-04-03 NOTE — Assessment & Plan Note (Signed)
Summary: discuss medication/alc   Vital Signs:  Patient profile:   77 year old female Height:      59 inches Weight:      153.4 pounds BMI:     31.10 Temp:     97.9 degrees F oral Pulse rate:   60 / minute Pulse rhythm:   regular BP sitting:   110 / 70  (left arm) Cuff size:   regular  Vitals Entered By: Benny Lennert CMA Duncan Dull) (September 24, 2009 11:00 AM)   History of Present Illness: Chief complaint discuss medications  HTN, f/u improved on current regiment and stable.  Could not tolerate aricept and became somewhat delusional.  had a nightmare when taking some aricept unusual drems that did not make sense to her.   was confused some after woke up and on aricept.   165/95   1 [phone] Alfonse Ras, husband Truman [facility]  Allergies: 1)  ! * Metoprolol 2)  ! Vicodin 3)  ! Nitrofurantoin Macrocrystal (Nitrofurantoin Macrocrystal) 4)  ! * Aricept 5)  * Sulfa (Sulfonamides) Group 6)  * Cold-On Patch   Impression & Recommendations:  Problem # 1:  SENILE DEMENTIA, UNCOMPLICATED (ICD-290.0) Assessment Deteriorated >45 minutes spent in total face to face time with the patient with >50% of time spent in counselling and coordination of care: approximately 30 minutes face-to-face with the patient and additional appointment is on the telephone with her daughter attempting to coordinate care and counseling. Primary concern is that her dementia is worsening. I think that she exhibits classical signs of Alzheimer's dementia. She is misplacing things, she is having some financial confusion  with regards to her checkbook..  I believe that she is forgetting her medications, and she is not remembering multiple conversations we have had in the past. She also did not get my name correct or my nurses today in the office.  I called her daughter, and discuss all this at length. She is in full agreement with me. I think it minimally patient needs to have some assistance  during the week, and really needs to have some assistance every day. Also think she should not be driving. Her daughter is going to contact her, try to become power of attorney, attempt to sell the patient's car, and she may come in for a  face-to-face office visit  with me  next week or so.  Problem # 2:  HYPERTENSION (ICD-401.9)  Her updated medication list for this problem includes:    Toprol Xl 200 Mg Xr24h-tab (Metoprolol succinate) .Marland Kitchen... 1 by mouth daily    Norvasc 10 Mg Tabs (Amlodipine besylate) .Marland Kitchen... 1 by mouth daily  Complete Medication List: 1)  Antivert 25 Mg Tabs (Meclizine hcl) .Marland Kitchen.. 1 by mouth q 6 hours as needed vertigo 2)  Zoloft 100 Mg Tabs (Sertraline hcl) .... 1/2 tab by mouth daily 3)  Glucophage Xr 500 Mg Xr24h-tab (Metformin hcl) .... 2 tabs by mouth daily 4)  Toprol Xl 200 Mg Xr24h-tab (Metoprolol succinate) .Marland Kitchen.. 1 by mouth daily 5)  Clorazepate Dipotassium 3.75 Mg Tabs (Clorazepate dipotassium) .Marland Kitchen.. 1-2 at bedtime as needed 6)  Indomethacin Cr 75 Mg Cpcr (Indomethacin) .... Take 1 tablet by mouth once a day 7)  Synthroid 75 Mcg Tabs (Levothyroxine sodium) .... Take 1 tablet by mouth once a day 8)  Nexium 40 Mg Cpdr (Esomeprazole magnesium) .... Take 1 tablet by mouth once a day 9)  Bl Stool Softener 100 Mg Caps (Docusate sodium) .... As  needed 10)  Zantac 75 75 Mg Tabs (Ranitidine hcl) .... Take one by mouth once a day 11)  Multivitamins Tabs (Multiple vitamin) .... Take one by mouth once a day 12)  Fluocinonide 0.05 % Crea (Fluocinonide) .... Apply to rash on arms two times a day 13)  Spiriva Handihaler 18 Mcg Caps (Tiotropium bromide monohydrate) .... Inhale 1 capsule once daily 14)  Norvasc 10 Mg Tabs (Amlodipine besylate) .Marland Kitchen.. 1 by mouth daily 15)  Aspir-low 81 Mg Tbec (Aspirin) .Marland Kitchen.. 1 by mouth daily  Patient Instructions: 1)  NORVASC TAKE A 1/2 A TABLET FOR 1 WEEK, THEN TAKE 1 TABLET A DAY Prescriptions: ZOLOFT 100 MG TABS (SERTRALINE HCL) 1/2 tab by mouth  daily Brand medically necessary #30 x 11   Entered and Authorized by:   Hannah Beat MD   Signed by:   Hannah Beat MD on 09/24/2009   Method used:   Electronically to        [facility]* (retail)       [address]       [place], [place]  [address]       Ph: [phone]       Fax: [phone]   RxID:   [id number] NEXIUM 40 MG  CPDR (ESOMEPRAZOLE MAGNESIUM) Take 1 tablet by mouth once a day Brand medically necessary #30 x 11   Entered and Authorized by:   Hannah Beat MD   Signed by:   Hannah Beat MD on 09/24/2009   Method used:   Electronically to        [facility]* (retail)       [address]       [place], [place]  [address]       Ph: [phone]       Fax: [phone]   RxID:   [id number] SYNTHROID 75 MCG  TABS (LEVOTHYROXINE SODIUM) Take 1 tablet by mouth once a day Brand medically necessary #30 x 11   Entered and Authorized by:   Hannah Beat MD   Signed by:   Hannah Beat MD on 09/24/2009   Method used:   Electronically to        [facility]* (retail)       [address]       [place], [place]  [address]       Ph: [phone]       Fax: [phone]   RxID:   [id number]   Current Allergies (reviewed today): ! * METOPROLOL ! VICODIN ! NITROFURANTOIN MACROCRYSTAL (NITROFURANTOIN MACROCRYSTAL) ! * ARICEPT * SULFA (SULFONAMIDES) GROUP * COLD-ON PATCH

## 2010-04-03 NOTE — Assessment & Plan Note (Signed)
Summary: 1 wk f/u dlo   Vital Signs:  Patient profile:   77 year old female Height:      59 inches Weight:      160.2 pounds BMI:     32.47 Temp:     97.5 degrees F oral Pulse rate:   68 / minute Pulse rhythm:   regular BP sitting:   178 / 72  (left arm) Cuff size:   regular  Vitals Entered By: Benny Lennert CMA Duncan Dull) (May 02, 2009 3:07 PM)  History of Present Illness: Chief complaint followup BP  [name] brings in Bp readings in the 180's - 190's systolics. She has not been 100% compliant with her medications, but yesterday she did take Toprol XL 100 mg by mouth two times a day and lisinopril 10 mg by mouth two times a day. She has been afraid to take all at once.  Last visit, all medications and directions reviewed in great detail.  Allergies: 1)  ! * Metoprolol 2)  ! Vicodin 3)  ! Nitrofurantoin Macrocrystal (Nitrofurantoin Macrocrystal) 4)  * Sulfa (Sulfonamides) Group 5)  * Cold-On Patch  Past History:  Past medical, surgical, family and social histories (including risk factors) reviewed, and no changes noted (except as noted below).  Past Medical History: Reviewed history from 07/04/2008 and no changes required. Anxiety Colonic polyps, hx of--tubular adenomas COPD Coronary artery disease-small vessel disease Diverticulosis, colon Hypothyroidism DVT, hx of Depression Essential tremor Degenerative disc disease Osteoarthritis GERD Hypertension NIDDM, 9/09 microalbinuria Basal Cell CA, req. Mohs  CONSULTANTS Dr Stacey Drain  [phone] Dr Orlin Hilding Dr Maple Hudson Derm: Diona Browner, [facility]. [name] in the past)  Past Surgical History: Reviewed history from 11/22/2007 and no changes required. Appendectomy 1952 Cholecystectomy 1944 Hysterectomy 1963 Ovarian cyst- secondary ventral hematoma 1982 Bladder tacking 1997 Pneumonia x 3 (out pt.) Cataracts/IOL- bilat. Echo- mild LVH, NO EF 10/04 T12 comp fx, then confusion, ? secondary Vicodin  03/08 kyphoplasty, [facility]  Family History: Reviewed history from 10/13/2006 and no changes required. Father: Died in his 48's, pancreatic cancer Mother: Died in her 75's, CVA  2 Brothers, both deceased, one died at 18, heart, CVA, one died at 65, heart.  One sister died at age 38, bipolar, ESRD CAD and HTN in brothers No DM No breast or colon cancer  Social History: Reviewed history from 11/22/2007 and no changes required.  Widowed Children: 2 Daughters (1 in [place], 1 [address] Frazier's Grandmother Occupation: Retired, Diplomatic Services operational officer Enjoys reading/movies/day trips Never Smoked Alcohol use-yes  Review of Systems       No fever, chillls, sweats, no cp. Has had some HA. Eating OK.  Physical Exam  General:  Well-developed,well-nourished,in no acute distress; alert,appropriate and cooperative throughout examination Head:  Normocephalic and atraumatic without obvious abnormalities. No apparent alopecia or balding. Ears:  no external deformities.   Nose:  no external deformity.   Mouth:  pharynx pink and moist, no erythema, and no exudates.   Neck:  No deformities, masses, or tenderness noted. Lungs:  Normal respiratory effort, chest expands symmetrically. Lungs are clear to auscultation, no crackles or wheezes. Heart:  normal rate, regular rhythm, and no murmur.  no gallop and no rub.   Extremities:  No clubbing, cyanosis, edema, or deformity noted with normal full range of motion of all joints.   Neurologic:  alert & oriented X3 and gait normal.   Psych:  normally interactive, good eye contact, not anxious appearing, and not depressed appearing.  Impression & Recommendations:  Problem # 1:  HYPERTENSION (ICD-401.9) Assessment Deteriorated >25 minutes spent in face to face time with patient, >50% spent in counselling or coordination of care: again I reviewed all of her medications with her, gave her new scripts for once daily dosing to attempt to simplify and  help compliance. Reassured her that Toprol XL 200 mg qday was equivalent to 100 mg two times a day dosing, and reviewed importance of compliance each and every day.   I also brought up some concerns about her forgetfulness, but I think that she is competent, and she has asked me not to call or speak to anyone in her family.  Her updated medication list for this problem includes:    Toprol Xl 200 Mg Xr24h-tab (Metoprolol succinate) .Marland Kitchen... 1 by mouth daily    Lisinopril 40 Mg Tabs (Lisinopril) .Marland Kitchen... 1 by mouth once daily    Amlodipine Besylate 5 Mg Tabs (Amlodipine besylate) .Marland Kitchen... 1 by mouth daily  Complete Medication List: 1)  Antivert 25 Mg Tabs (Meclizine hcl) .Marland Kitchen.. 1 by mouth q 6 hours as needed vertigo 2)  Zoloft 100 Mg Tabs (Sertraline hcl) .... 1/2 tab by mouth daily 3)  Glucophage Xr 500 Mg Xr24h-tab (Metformin hcl) .... 2 tabs by mouth daily 4)  Toprol Xl 200 Mg Xr24h-tab (Metoprolol succinate) .Marland Kitchen.. 1 by mouth daily 5)  Clorazepate Dipotassium 3.75 Mg Tabs (Clorazepate dipotassium) .Marland Kitchen.. 1-2 at bedtime as needed 6)  Plavix 75 Mg Tabs (Clopidogrel bisulfate) .... Take one half tablet by mouth once a day 7)  Indomethacin Cr 75 Mg Cpcr (Indomethacin) .... Take 1 tablet by mouth once a day 8)  Synthroid 75 Mcg Tabs (Levothyroxine sodium) .... Take 1 tablet by mouth once a day 9)  Nexium 40 Mg Cpdr (Esomeprazole magnesium) .... Take 1 tablet by mouth once a day 10)  Bl Stool Softener 100 Mg Caps (Docusate sodium) .... As needed 11)  Zantac 75 75 Mg Tabs (Ranitidine hcl) .... Take one by mouth once a day 12)  Multivitamins Tabs (Multiple vitamin) .... Take one by mouth once a day 13)  Spiriva Handihaler 18 Mcg Caps (Tiotropium bromide monohydrate) .... Inhale contents of 1 capsule once a day - must make appt with dr. Maple Hudson for further refills 14)  Fluocinonide 0.05 % Crea (Fluocinonide) .... Apply to rash on arms two times a day 15)  Lisinopril 40 Mg Tabs (Lisinopril) .Marland Kitchen.. 1 by mouth once  daily 16)  Amlodipine Besylate 5 Mg Tabs (Amlodipine besylate) .Marland Kitchen.. 1 by mouth daily  Contraindications/Deferment of Procedures/Staging:    Test/Procedure: Statin Declined    Reason for deferment: patient declined   Patient Instructions: 1)  f/u 2 weeks Prescriptions: AMLODIPINE BESYLATE 5 MG TABS (AMLODIPINE BESYLATE) 1 by mouth daily  #30 x 11   Entered and Authorized by:   Hannah Beat MD   Signed by:   Hannah Beat MD on 05/02/2009   Method used:   Print then Give to Patient   RxID:   [id number] LISINOPRIL 40 MG TABS (LISINOPRIL) 1 by mouth once daily  #30 x 11   Entered and Authorized by:   Hannah Beat MD   Signed by:   Hannah Beat MD on 05/02/2009   Method used:   Print then Give to Patient   RxID:   [id number] TOPROL XL 200 MG XR24H-TAB (METOPROLOL SUCCINATE) 1 by mouth daily Brand medically necessary #30 x 11   Entered and Authorized by:   Hannah Beat MD  Signed by:   Hannah Beat MD on 05/02/2009   Method used:   Print then Give to Patient   RxID:   [id number] AMLODIPINE BESYLATE 5 MG TABS (AMLODIPINE BESYLATE) 1 by mouth daily  #30 x 11   Entered and Authorized by:   Hannah Beat MD   Signed by:   Hannah Beat MD on 05/02/2009   Method used:   Print then Give to Patient   RxID:   [id number] LISINOPRIL 20 MG TABS (LISINOPRIL) 1 by mouth two times a day  #60 x 11   Entered and Authorized by:   Hannah Beat MD   Signed by:   Hannah Beat MD on 05/02/2009   Method used:   Print then Give to Patient   RxID:   [id number] TOPROL XL 100 MG XR24H-TAB (METOPROLOL SUCCINATE) 1 by mouth two times a day (allergic to generic, but can take brand) Brand medically necessary #60 x 11   Entered and Authorized by:   Hannah Beat MD   Signed by:   Hannah Beat MD on 05/02/2009   Method used:   Print then Give to Patient   RxID:   [id number] CLORAZEPATE DIPOTASSIUM 3.75 MG  TABS (CLORAZEPATE DIPOTASSIUM) 1-2 at  bedtime as needed  #180 x 1   Entered and Authorized by:   Hannah Beat MD   Signed by:   Hannah Beat MD on 05/02/2009   Method used:   Print then Give to Patient   RxID:   [id number]   Current Allergies (reviewed today): ! * METOPROLOL ! VICODIN ! NITROFURANTOIN MACROCRYSTAL (NITROFURANTOIN MACROCRYSTAL) * SULFA (SULFONAMIDES) GROUP * COLD-ON PATCH

## 2010-04-03 NOTE — Progress Notes (Signed)
Summary: regarding BP  Phone Note Call from Patient Call back at Home Phone [phone]   Caller: Patient Summary of Call: Pt called to report that her BP is 189/73 today.  She started lisinopril last night.  She is taking the toprol in the morning and the lisinopril at night.  Is this ok or should she be taking both at the same time? Initial call taken by: Lowella Petties CMA,  April 24, 2009 12:18 PM  Follow-up for Phone Call        Have her take 2 lisinopril tablets. ok to take another one today.  I will see her tomorrow. Block 30 minutes for this appt. Follow-up by: Hannah Beat MD,  April 24, 2009 2:05 PM  Additional Follow-up for Phone Call Additional follow up Details #1::        Patient advised as instructed.  30 minutes blocked for her appt tomorrow.   Additional Follow-up by: Linde Gillis CMA Duncan Dull),  April 24, 2009 3:28 PM

## 2010-05-18 ENCOUNTER — Ambulatory Visit: Payer: Medicare Other | Admitting: Family Medicine

## 2010-06-03 ENCOUNTER — Other Ambulatory Visit: Payer: Self-pay | Admitting: Orthopedic Surgery

## 2010-06-03 DIAGNOSIS — M25551 Pain in right hip: Secondary | ICD-10-CM

## 2010-06-03 DIAGNOSIS — M549 Dorsalgia, unspecified: Secondary | ICD-10-CM

## 2010-06-05 ENCOUNTER — Telehealth: Payer: Self-pay | Admitting: *Deleted

## 2010-06-05 NOTE — Telephone Encounter (Signed)
[facility] is asking for documentation for the need for pt's diabetes supplies, they are being audited.  Note from them, and the letter they received from [employer] is on  Your desk.

## 2010-06-07 ENCOUNTER — Ambulatory Visit
Admission: RE | Admit: 2010-06-07 | Discharge: 2010-06-07 | Disposition: A | Discharge status: Home / Self Care | Payer: Medicare Other | Source: Ambulatory Visit | Attending: Orthopedic Surgery | Admitting: Orthopedic Surgery

## 2010-06-07 DIAGNOSIS — M25551 Pain in right hip: Secondary | ICD-10-CM

## 2010-06-07 DIAGNOSIS — M549 Dorsalgia, unspecified: Secondary | ICD-10-CM

## 2010-07-15 NOTE — Assessment & Plan Note (Signed)
[facility]                             PULMONARY OFFICE NOTE   NAME:[name], [name]                      MRN:          [id number]  DATE:08/06/2006                            DOB:          [birth year]    PROBLEMS:  1. Dyspnea.  2. Old granulomatous scarring/positive tuberculosis skin test.  3. Chronic obstructive pulmonary disease.  4. Diabetes.  5. Enlarged heart.   HISTORY:  She had been in and out of hospital and skilled nursing  facility this spring after vertebral fracture and kyphoplasty at T12.  She is wearing a back brace. She says that she has accidentally  swallowed Spiriva capsules several times because it is the same color as  something else she takes. We discussed this.   MEDICATIONS:  1. Spiriva.  2. Antivert used p.r.n.  3. Nexium.  4. Synthroid 75 mcg.  5. Toprol 1/2 x37.5 mg.  6. Plavix.  7. Zantac.  8. Zoloft 50 mg.  9. Metformin 500 mg.   Drug intolerant SULFA and VICODIN.   OBJECTIVE:  Weight 147 pounds, blood pressure 132/80, pulse 58, room air  saturation 98%. Some dullness to the right base with a few crackles. No  cough. No adenopathy found. Heart sounds are regular without murmur or  gallop. She is quite talkative and certainly not breathless. There is no  edema or cyanosis. She had a portable chest x-ray on March 20 with some  difficulty evaluating the left base and PA and lateral recommended for  follow up. Cardiac enlargement. A CT of the chest in 2006 had shown some  calcified hilar nodes and chronic lung changes with no acute process.   IMPRESSION:  Dyspnea/chronic obstructive pulmonary disease, recent  problems related to vertebral fracture. Medication mix up with no harm,  but educated on management. Rhinitis, old granulomatous disease with  abnormal chest x-ray, no symptoms suggesting active tuberculosis.   PLAN:  Chest x-ray. Schedule return two months, earlier p.r.n.     Clinton D. Maple Hudson, MD, Tonny Bollman,  FACP  Electronically Signed    CDY/MedQ  DD: 08/08/2006  DT: 08/09/2006  Job #: [id number]   cc:   Karie Schwalbe, MD  Peter M. [place], M.D.

## 2010-07-18 ENCOUNTER — Other Ambulatory Visit: Payer: Self-pay | Admitting: *Deleted

## 2010-07-18 MED ORDER — METOPROLOL SUCCINATE ER 100 MG PO TB24: 100.0000 mg | ORAL_TABLET | Freq: Every day | ORAL | Status: DC

## 2010-07-18 NOTE — Consult Note (Signed)
[name], [name]               ACCOUNT NO.:  [id number]   MEDICAL RECORD NO.:  [id number]           PATIENT TYPE:   LOCATION:                                 FACILITY:   PHYSICIAN:  Sanjeev K. Deveshwar, [name] SuarezDATE OF BIRTH:  30-Mar-1924   DATE OF CONSULTATION:  05/12/2006  DATE OF DISCHARGE:                                 CONSULTATION   CHIEF COMPLAINT:  Compression fracture.   HISTORY OF PRESENT ILLNESS:  This is a pleasant 77 year old female who  fell last week while having an episode of vertigo.  The patient landed  on her buttocks as well as her back.  She also bruised her head and her  shoulder.  Apparently, some plain films had shown a compression  fracture.  The patient was referred to Dr. Corliss [name] through the  courtesy of Dr. Jerl Santos.  The patient had an MRI today just prior to  her evaluation with Dr. Corliss [name].  The MRI confirmed a compression  fracture at the T12 level.  It was felt to be an acute or subacute  fracture with an approximate 50% loss of vertebral height.  There was  also noted to be multilevel degenerative disease of lumbar spine with  central canal narrowing, appearing greatest at the L4-L5 level.  The  patient presented with her daughter to be seen by Dr. Corliss [name].   PAST MEDICAL HISTORY:  Significant for diabetes mellitus.  She reports a  history of TIAs, hypertension, coronary artery disease, COPD,  gastroesophageal reflux disease, fibromyalgia, degenerative joint  disease, dysthymia.  She has a history of a left lower extremity DVT in  2004.  She has had dizziness and vertigo for approximately 1 year, as  well as a tremor followed by Dr. Orlin Hilding.  She reports having had a  stress test several years ago and was told she had small-vessel disease.   SURGICAL HISTORY:  Significant for a cholecystectomy, hysterectomy,  rectocele repair, knee surgery and foot surgery.  She denies any  previous problems with anesthesia.   ALLERGIES:  SULFA.   CURRENT MEDICATIONS:  1. Antivert.  2. She is taking oxycodone for pain.  3. She is also on Synthroid.  4. Surfak.  5. Triamterene.  6. Zantac.  7. Multivitamin.  8. Aspirin 81 mg daily.  9. Clorazepate 3.75 mg daily.  10.Tylenol PM p.r.n.  11.Toprol-XL daily.  12.Zoloft daily.  13.Indomethacin daily.  14.Metformin daily.   SOCIAL HISTORY:  The patient is widowed.  She has two children.  She  lives alone in [place].  She has never smoked but she has been exposed to  secondhand smoke.  She worked as a Diplomatic Services operational officer.  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   FAMILY HISTORY:  Her mother died at age 70 from a CVA.  Her father died  in his 48s from pancreatic cancer.   IMPRESSION AND PLAN:  As noted, this patient presents today accompanied  by her daughter for a consultation with Dr. Corliss [name] to be evaluated  for a compression fracture.  An MRI was performed just prior to the  appointment which confirmed a T12 compression fracture.  The patient had  a 50% loss of height in this vertebral body but no significant  retropulsion.   Dr. Corliss [name] recommended a kyphoplasty procedure.  The procedure was  described in detail along with the risks and benefits.  Other treatment  options such as continued treatment with pain medication and limited  mobility were also offered.   Prior to this injury, the patient was independent.  She was driving.  She was able to ambulate household distances with just the use of a  cane.  Since the fall, she has not been able to drive, she has been very  immobile.  She has had severe pain despite narcotic pain medication.  The patient and her daughter are anxious to proceed with the kyphoplasty  procedure for relief of pain and stabilization of the fracture.  It has  been scheduled for this coming Friday, May 14, 2006.   Greater than 40 minutes was spent on this consult.  The patient was  instructed to hold her metformin prior to the  procedure.      [name], P.A.    ______________________________  [name], M.D.    DR/MEDQ  D:  05/13/2006  T:  05/14/2006  Job:  [id number]   cc:   Lubertha Basque. Jerl Santos, M.D.  Karie Schwalbe, MD  Peter M. [place], M.D.  Catherine A. Orlin Hilding, M.D.

## 2010-07-18 NOTE — Assessment & Plan Note (Signed)
[facility]                             PULMONARY OFFICE NOTE   NAME:[name], [name] COUVILLON                      MRN:          [id number]  DATE:06/23/2006                            DOB:          October 14, 1924    PROBLEM:  1. Dyspnea.  2. Old granulomatous scarring- positive tuberculosis skin test.  3. Chronic obstructive pulmonary disease.   HISTORY:  First visit since 2006, when office spirometry was within  normal limits and a CT scan of the chest was negative except for some  calcified hilar nodes and changes of emphysema.  She is a never smoker.  Today, she gives a rather difficult history in great detail, saying that  she fell and hurt her back requiring kyphoplasty.  During the winter,  she had had an episode of sweats and had gone to the [facility] Emergency  Room complaining of dizziness, thought possibly related to her blood  pressure medicines with neurology workup negative.  She tells me it was  [facility] but I have records from [facility] in March, at which time  she was hospitalized with confusion and had neurology evaluation,  apparently without specific findings.  There is question of whether the  problem was due to medication.  Additional problems were diabetes,  hypertension, hypothyroidism, fibromyalgia, degenerative joint disease,  and a T-12 compression fracture status post kyphoplasty.  Discharge  medications were Antivert 25 mg b.i.d. p.r.n., metformin 500 mg, Toprol  XL one-half tablet daily, Nexium 40 mg, Plavix 75 mg, Zoloft 50 mg,  Synthroid 75 mcg, and Ambien 5 mg p.r.n. for nausea.  A chest x-ray done  May 21, 2006 at [facility] described cardiac enlargement, low volume chest  film with vascular crowding and atelectasis, stable elevation of the  right hemidiaphragm with difficulty evaluating the left lung base.  This  was a portable film and a two view chest x-ray was recommended when  able.  I did not have that report when I was  actually seeing her.  She  says she had been on Spiriva and she questions whether Spiriva may have  caused the dizziness, imbalance, and sweats.  She also complains of  decreased hearing, says her eyes bother her, and she has decreased sense  of smell.  She is not recognizing sneezing or nasal discharge.   MEDICATIONS:  Medication list as noted.   DRUG INTOLERANCES:  SULFA, VICODIN.   OBJECTIVE:  VITAL SIGNS:  Weight 147 pounds.  Blood pressure 122/84.  Pulse 64.  Room air saturation 95 percent.  GENERAL:  Talkative almost pressured speech, seemed mildly anxious but  oriented.  No rash, no adenopathy found.  HEENT:  Tympanic membranes were clear.  Nasal mucosa was a little  edematous, not markedly so.  Pharynx was clear.  Neck veins were not  distended.  There was no stridor.  LUNGS:  Clear.  HEART:  Heart sounds regular without murmur.  EXTREMITIES:  No peripheral edema or cyanosis.   IMPRESSION:  1. Dyspnea and mild chronic obstructive pulmonary disease, based on      previous complaint and evaluation with normal  pulmonary function      test but CT scan suggesting some senile emphysema in this never      smoker.  2. Anosmia, probably due to mild rhinitis, possibly allergic.  3. Eustachian dysfunction with complaints of pressure and dizziness.      There is no nystagmus now and I cannot tell if she is really having      Meniere's disease at all.   PLAN:  1. Since she suspects Spiriva, I have asked her to stop it for      observation.  2. Try sample Veramyst two sprays each nostril daily.  3. Schedule return in one month, earlier p.r.n.  We will request a      chest x-ray on return because of her prior granulomatous disease      and the limited visualization on her portable film.     Clinton D. Maple Hudson, MD, Tonny Bollman, FACP  Electronically Signed    CDY/MedQ  DD: 06/23/2006  DT: 06/23/2006  Job #: [id number]   cc:   Karie Schwalbe, MD  Peter M. [place], M.D.

## 2010-07-18 NOTE — H&P (Signed)
[name], [name]                         ACCOUNT NO.:  [id number]   MEDICAL RECORD NO.:  [id number]                   PATIENT TYPE:  EMS   LOCATION:  [facility]                                 FACILITY:  [facility]   PHYSICIAN:  Thora Lance, M.D.               DATE OF BIRTH:  [birth year]   DATE OF ADMISSION:  04/28/2002  DATE OF DISCHARGE:                                HISTORY & PHYSICAL   CHIEF COMPLAINT:  Left knee pain.   HISTORY OF PRESENT ILLNESS:  This is a 77 year old white female who presents  with several-day history of left knee pain. She had developed some mild  swelling in the left lower leg. She was seen today by Deidre Ala, M.D.  and was given a cortisone shot in the left knee.  He suspected a DVT and did  a Doppler ultrasound which did show a DVT in the left popliteal vein and  greater saphenous vein in the left calf. She has no history of prior DVT.  She denies any recent chest pain, shortness of breath, or cough. She has  been on longterm HRT.   PAST MEDICAL HISTORY:  1. Diabetes mellitus diet controlled.  2. Hypertension.  3. Hypothyroidism.  4. Fibromyalgia.  5. Degenerative joint disease of the spine and knees.  6. History of hypoglycemia with negative work up.  7. Dysthymia.   PAST SURGICAL HISTORY:  1. Cholecystectomy.  2. TAH/USO.  3. USO.  4. Rectocele repair in 1997.  5. Foot surgery by Deidre Ala, M.D. in August of 2003.   ALLERGIES:  SULFA.   MEDICATIONS:  1. Zoloft 50 mg q.p.m.  2. Chlorazepate 3.75 mg q.p.m.  3. Maxzide 50/75 mg 1/2 daily.  4. Ogen 1.25 mg 1/2 daily.  5. Synthroid 75 mcg daily.  6. Zantac 150 mg q.p.m.  7. Vitamin E 400 units a day.  8. Magnesium 250 mg a day.  9. Vitamin A/D daily.  10.      B1 100 mg a day.  11.      Aspirin 81 mg a day.   FAMILY HISTORY:  Father died of pancreatic cancer and alcoholism.  Mother  with CVA.  Had two brothers with CAD, one with MI in his 59s. One brother  status post CABG.  Brother with atherosclerotic disease.   SOCIAL HISTORY:  Husband deceased.  She has two daughters, one living in  [place] and one in [place].  She is a retired Theme park manager.  She does not smoke or drink alcohol.   REVIEW OF SYSTEMS:  Recent case of Urticaria treated by her dermatologist  with a 12-day course of prednisone, completed three days ago.  The rest of  her systems were negative.   PHYSICAL EXAMINATION:  GENERAL: She appears comfortable lying in the ER.  VITAL SIGNS:  Blood pressure 149/62, heart rate 78, respirations 22,  temperature 98.4, oxygen  saturation 96% on room air.  HEENT:  Pupils equal, round, and reactive to light and extraocular movements  intact. Ears; tympanic membranes are clear.  Oropharynx is clear.  NECK:  Supple. No lymphadenopathy and no thyromegaly or bruits.  LUNGS:  Clear.  HEART:  Regular rate and rhythm with a 1-2/6 systolic ejection murmur.  No  gallop, rub, or click.  ABDOMEN: Soft, nontender, no mass or hepatosplenomegaly.  BREASTS: No evidence of mass or nodule.  RECTAL: Normal tone. No rectal mass.  Stool was brown and heme negative.  EXTREMITIES:  No edema. There is no calf tenderness.  Homan's sign is  negative.  The left knee shows full range of motion with only mild  tenderness. No erythema.  NEUROLOGY:  Nonfocal.   LABORATORY DATA:  CMET, CBC, PT, PTT, UA, chest x-ray pending.   Doppler venous ultrasound at [facility] shows a DVT  visualized in the left popliteal vein and a superficial thrombus visualized  in the greater saphenous vein below the knee.   ASSESSMENT:  1. Left calf deep venous thrombosis.  2. Diabetes mellitus type 2.  3. Hypertension.   PLAN:  1. Admit.  2. Lovenox subcu.  3.     Coumadin per pharmacy.  4. Discontinue aspirin and Ogen.  5. Check laboratories and chest x-ray.                                               Thora Lance, M.D.    Delorse Limber  D:  04/28/2002  T:   04/28/2002  Job:  [id number]   cc:   Ike Bene, M.D.  [address]  [place]  [place] [address]  Fax: [phone]

## 2010-07-18 NOTE — Telephone Encounter (Signed)
escribe medication per fax request  
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
## 2010-07-18 NOTE — Discharge Summary (Signed)
NAMEVIANNEY, [name]               ACCOUNT NO.:  [id number]   MEDICAL RECORD NO.:  [id number]          PATIENT TYPE:  INP   LOCATION:  2015                         FACILITY:  [facility]   PHYSICIAN:  Beckey Rutter, MD  DATE OF BIRTH:  08/15/1924   DATE OF PROCEDURE:  DATE OF DISCHARGE:  05/25/2006                    STAT - MUST CHANGE TO CORRECT WORK TYPE   PRIMARY CARE PHYSICIAN:  Dr. Tillman Abide at [facility] and  assigned to [place].   NEUROLOGIST:  Dr. Orlin Hilding.   CHIEF COMPLAINT UPON ADMISSION:  Confusion.   HISTORY OF PRESENT ILLNESS:  In brief, a 77 year old Caucasian female  who lives independently.  She came in here with increasing vertigo and  confusion and some forgetfulness.  For full H and P please refer to the  H and P dictated by admission physician .   DISCHARGE DIAGNOSES:  1. Confusion, likely secondary to medication, which includes sedatives      as well as narcotics.  2. Diabetes.  3. Hypertension.  4. Hypothyroidism.  5. Fibromyalgia.  6. Degenerative joint disease.  7. T12 compression fracture, status-post Interventional Radiology      procedure, kyphoplasty.   MEDICATIONS:  1. The patient will be discharged on Antivert 25 mg b.i.d. p.r.n.  2. Metformin 500 mg daily.  3. Toprol XL 1/2 tab daily.  4. Nexium 40 mg daily.  5. Plavix 75 mg daily.  6. Zoloft 50 mg daily.  7. Synthroid 75 mcg daily.  8. Ambien 5 mg p.r.n. for insomnia.   HOSPITAL COURSE:  1. Confusion.  The patient seems to have the confusion secondary to      pain medication/narcotics as well as the sedatives.  Hence, all the      workup for the patient for confusion yielded negative results      including the MRI, MRA.  2. Back pain.  The patient had MRI for the back which is essentially      negative for new fracture and patient complaining of back pain      during hospitalization only because she has been off of the pain      medication that she has been on and causing  problem number one.      She was seen by Dr. __ during this hospitalization from Neurology      and the idea was she might have TIA but not really a stroke, which      is validated by the MRI and MRA and the CAT scan as well.   HOSPITAL PROCEDURES:  1. CT scan of head done on admission date in the emergency department      show impression of atrophy and microvascular ischemic change.  2. No acute intracranial finding by non-contrast imaging. The patient      also had portable chest x-ray on the day of admission, 05/20/2006      showing impression of (1) cardiac enlargement; (2) low volume chest      film with vascular crowding and atelectasis; (3) stable elevation      of the right hemidiaphragm; (4) the left lung base difficult to  completely evaluate.  Followup to when able is suggested.  The      patient had MRI of the brain showing impression (1) stable atrophy      and chronic microvascular ischemic change; (2) no acute strokes.      The MRA impression is reading unremarkable MR on geography of the      intracranial circulation without contrast and no interval change      from 2006 study.  The MR of the spine, which was done on 05/24/2006      reading impression (1) compression deformity T12 vertebra, has been      treated with kyphoplasty now with altered signal and enhancement      throughout entire T12 vertebra extending to the pedicles.  Mild non-      specific paravertebral enhancement.  These findings might be      expected post ___ change although infection could not be excluded      in the appropriate clinical setting.  No other finding suggests      such.  (2) No new compression deformity.  The remainder of findings      similar to that of prior exam as noted above.   DISCHARGE PLAN:  The patient is stable for discharge. She was instructed  to refrain as much as possible from the pain medication and take simple  Tylenol.  The patient will be taken off of the sedatives  and  benzodiazepine medication as well.  She will be discharged to assisted  living facility as agreed with the family and her at least for sometime  to regain strength and essentially for the need of supervision at this  time.      Beckey Rutter, MD  Electronically Signed     EME/MEDQ  D:  05/25/2006  T:  05/25/2006  Job:  [id number]

## 2010-07-18 NOTE — Op Note (Signed)
[name], [name]                         ACCOUNT NO.:  [id number]   MEDICAL RECORD NO.:  [id number]                   PATIENT TYPE:  AMB   LOCATION:  DSC                                  FACILITY:  [facility]   PHYSICIAN:  Harvie Junior, M.D.                DATE OF BIRTH:  September 28, 1924   DATE OF PROCEDURE:  07/07/2002  DATE OF DISCHARGE:  07/07/2002                                 OPERATIVE REPORT   PREOPERATIVE DIAGNOSES:  Medial meniscal tear with suspected degenerative  change and patellofemoral degenerative change.   POSTOPERATIVE DIAGNOSES:  Medial meniscal tear with suspected degenerative  change and patellofemoral degenerative change.   PROCEDURES:  1. Partial posterior horn medial meniscectomy with debridement of     corresponding medial compartment.  2. Debridement of chondromalacia of the patella with corresponding     debridement of medial plica.   SURGEON:  Harvie Junior, M.D.   ASSISTANT:  Marshia Ly, P.A.   ANESTHESIA:  General.   BRIEF HISTORY:  She is a 77 year old female with a long history of having  left knee pain.  The patient ultimately had been evaluated multiple times in  our office, had undergone injection therapy, ultimately had a complicating  issue where she developed a deep venous thrombosis.  She was and is  currently being treated for this.  Because of the continued and significant  pain in the knee, she was evaluated by MRI by her family doctor and  ultimately found to have a displaced medial meniscal tear.  She was sent  back to our office at that point for evaluation and felt at that time to  have pain coming from degenerative change in the medial side and the medial  meniscus.  We talked about treatment options versus waiting until her course  of treatment for her DVT was completed and she ultimately felt that this  needed to be done more expeditiously, and she was brought to the operating  room for this procedure.  After discussions  with her internist, Ike Bene, M.D., we ultimately elected to get a repeat Doppler of her lower  extremity that showed that she had reopened the areas of previous blockage  and because of this felt that it was safe to proceed.  We took her off of  her Coumadin, placed her on Lovenox, and had her not take Lovenox the  morning of surgery.  Her protime was normal at this point, and she was  brought to the operating room for surgical intervention.   DESCRIPTION OF PROCEDURE:  The patient was brought to the operating room and  after an adequate level of anesthesia was obtained with a general  anesthetic, the patient was placed supine upon the operating table and the  left leg was prepped and draped in the usual sterile fashion.  Following  this, routine arthroscopic examination of the knee revealed that there  was  an obvious chondromalacia of the patellofemoral joint, grade 2 in nature.  This was debrided to a smooth and stable rim with a suction shaver.  Attention was then turned to the medial compartment.  We were blocked  getting into the medial compartment because of the medial plica, and the  plica was debrided at this point.  Attention was then turned into the medial  compartment, where there was a complex tear of the posterior horn of the  meniscus.  This could be pulled into the knee with a probe.  At this point  the meniscus was debrided with a straight biting basket forceps to remove  the portion that would pull into the knee.  A probe was used following this  to make sure there were no remaining portions which would pull into the  knee.  The meniscus was then contoured down back to this posterior area to  make sure there was no greater area of continued tear.  Then attention was  turned to the anterior cruciate, which was normal, and the lateral side was  evaluated and was noted to be normal.  Attention was then turned back to the  medial side, where the medial femoral  condyle was identified and noted to  have a grade 4 defect over about a 2 x 1 sq. cm area.  The mid-body of the  meniscus was maintained to allow for stress transmission in this area, and  ultimately all shouldering lesions off of this area were debrided with a  suction shaver.  At this point the knee was copiously irrigated and  suctioned dry.  The arthroscopic portals were closed.  20 mL of 0.25%  Marcaine were instilled in the knee for postoperative anesthesia and to try  control some of her postoperative bleeding, and the patient was taken to the  recovery room, where she was noted to be in satisfactory condition.  The  patient will be started on Lovenox tonight and for the next three days as  well as starting back on her Coumadin full dose tonight.  We will plan on  seeing her back in he office early next week to make sure there is no  evidence of bleeding or bleeding abnormalities, and the patient was  instructed should she have any concerns over bleeding this weekend to call  our on-call physician, and she is aware of this, as well as the person that  she is staying with.                                               Harvie Junior, M.D.    Ranae Plumber  D:  07/07/2002  T:  07/09/2002  Job:  [id number]   cc:   Ike Bene, M.D.  [address]  [place]  [place] [address]  Fax: [phone]

## 2010-07-18 NOTE — Consult Note (Signed)
[name], [name]               ACCOUNT NO.:  [id number]   MEDICAL RECORD NO.:  [id number]          PATIENT TYPE:  INP   LOCATION:  1823                         FACILITY:  [facility]   PHYSICIAN:  Bevelyn Buckles. Champey, M.D.DATE OF BIRTH:  [birth year]   DATE OF CONSULTATION:  DATE OF DISCHARGE:                                 CONSULTATION   REASON FOR CONSULTATION:  Altered mental status, rule out stroke.   HISTORY OF PRESENT ILLNESS:  [name] is a 77 year old Caucasian female  with multiple medical problems who presents with episode of confusion,  memory disturbance since this morning. The patient had surgery last week  for vertebral compression fracture and was given oxycodone which she  finished last night. The patient started Vicodin this morning and after  taking Vicodin within 1-2 hours she felt strange and stated she could  not remember her neighbor's names and names of her family to call. She  felt out of control.  She states she started also having slurred speech  at that time for a few hours and now the patient has a slight headache.  She did not have a headache earlier this morning.  She denies any focal  weakness, numbness, vision changes, swallowing problems, hearing  problems, vertigo, falls or loss of consciousness.   PAST MEDICAL HISTORY:  Positive for  1. Diabetes.  2. Hypertension.  3. Arthritis.  4. Compression fracture.  5. Hypothyroidism.  6. Fibromyalgia.  7. Tremor.   MEDICATIONS:  Include  1. Toprol.  2. Metformin.  3. Colace.  4. Indocin.  5. Nexium.  6. Aspirin.  7. Spiriva.   ALLERGIES:  The patient has drug allergies to SULFA.   FAMILY HISTORY:  Positive for essential tremor and heart disease.   SOCIAL HISTORY:  The patient lives alone.  Denies smoking. Occasionally  drinks wine.   REVIEW OF SYSTEMS:  Positive as per HPI.  Also for shortness of breath.  Review of systems is negative as per HPI in greater than seven other  organ systems.   PHYSICAL EXAMINATION:  VITALS:  Temperature is 97.8, blood pressure  195/91 to 157/72, pulse 70, respirations 20, O2 sat 97%.  HEENT:  Normocephalic, atraumatic.  Extraocular muscles intact.  Pupils equal  and react to light.  NECK:  Supple.  No carotid bruits.  HEART: Regular.  LUNGS:  Clear.  ABDOMEN:  Soft.  EXTREMITIES:  Show good pulses.  NEUROLOGIC:  The patient is awake, alert and oriented.  Language is  fluent.  The patient is following commands appropriately.  Cranial  nerves II-XII are grossly intact.  Motor examination shows 4+/5 strength  and normal tone in all four extremities.  No drift is noted.  Sensory  examination is decreased sensation to light touch on the left and  decreased pinprick in the right lower greater than upper extremity when  compared to left. Reflexes are trace throughout.  Toes are neutral  bilaterally.  Cerebellar function is within normal limits. Finger-to-  nose and gait is not assessed secondary to safety.   CT of the head showed no acute process and some  small-vessel disease.   WBC 7.2, hemoglobin 11.9, hematocrit 35, platelets is 379.  PT is 13.8,  INR is 1.0, PTT is 31.  Sodium is 132, potassium is 4.2, chloride is  101, CO2 27, BUN 18, creatinine 0.8, glucose 118.  LFTs are normal.  Calcium is 9.2.   IMPRESSION:  This is a 77 year old Caucasian female with confusion and  speech difficulty gradually improving since this morning. Possible  differential includes medication related versus infectious versus TIA  versus anxiety versus seizures.  I will recommend getting an MRI, MRA of  the brain without contrast to rule out stroke.  Will recommend changing  her aspirin to Plavix. If the patient has a positive stroke I would  recommend including stroke workup with 2-D echocardiogram, carotids,  lipids and homocysteine level.  Recommend getting an EEG to rule out  seizure. Also check TSH, B12, RPR, ESR, folate and B1 levels. Rule out  other   etiologies for memory disturbance confusion. Recommend avoiding a  strong sedative pain medication like Vicodin that could contribute to  altered mental status. Will follow the patient as consultants.      Bevelyn Buckles. Nash Shearer, M.D.  Electronically Signed     DRC/MEDQ  D:  05/21/2006  T:  05/21/2006  Job:  [id number]

## 2010-07-18 NOTE — H&P (Signed)
NAMECARIS, CERVENY               ACCOUNT NO.:  [id number]   MEDICAL RECORD NO.:  [id number]          PATIENT TYPE:  INP   LOCATION:  2015                         FACILITY:  [facility]   PHYSICIAN:  Hettie Holstein, D.O.    DATE OF BIRTH:  10-Jun-1924   DATE OF ADMISSION:  05/20/2006  DATE OF DISCHARGE:                              HISTORY & PHYSICAL   PRIMARY CARE PHYSICIAN:  Dr. Tillman Abide  at [facility], unassigned to our service.   NEUROLOGIST:  She does see Dr. Orlin Hilding as her primary neurologist.   CHIEF COMPLAINT:  Confusion.   HISTORY OF PRESENTING ILLNESS:  Mrs. Vo is a very pleasant 77 year old  Caucasian female who lives independently, who had about a week ago  sustained a fall due to vertigo and sustained a compression fracture and  underwent kyphoplasty and was sent home with prescribed Vicodin.  She  routinely takes oxycodone and though it is not clear, it does sound as  if Mrs. Montijo took Vicodin in addition to oxycodone and shortly  thereafter she experienced these episodes which she describes as  confusion.  She stated that around 10:00 or 10:30 she began to feel  funny.  She got disoriented.  She could not recall phone numbers and TV  channels and programming, which she is typically able to realize.  She  was unable to recall phone numbers and simply had to wait until someone  called her, where she reported that she was confused and an ambulance  was dispatched and she was initially going to go to [place], for which  she refused, as she knew Dr. Orlin Hilding over here at [facility] as her  primary neurologist and was evaluated in the emergency department.  She  was seen by Dr. Estell Harpin in the emergency department, who contacted Dr.  Nash Shearer and she had a CT scan that was not suggestive of acute bleed.  Her symptoms resolved in the emergency department to the point where she  was completely alert and oriented without focal neurologic deficit.  It  appeared as though this was most likely related to a reaction to a new  medication; however, she was evaluated by Dr. Nash Shearer and Neurology, who  felt as though she should proceed with evaluation of possible TIA or  stroke.  She is being admitted for further testing.   PAST MEDICAL HISTORY:  1. DVT in the past.  2. Diabetes mellitus.  3. Hypertension.  4. Hypothyroidism.  5. Fibromyalgia.  6. Degenerative joint disease.  7. Degenerative disk disease. ,  8. Status post cholecystectomy, total abdominal hysterectomy and      unilateral salpingo-oophorectomy.  9. She has a longstanding history of vertigo and tremor.   MEDICATIONS:  She goes to [facility] [address], phone number [phone].  1. Antivert 25 mg t.i.d. p.r.n.  2. Metformin 500 mg daily.  3. Toprol-XL 25 mg one and a half tablets daily.  4. Nexium 40 mg daily.  5. Indocin; she takes 75 mg p.o. nightly and on p.r.n. basis; she will  take a 50-mg tablet as needed.  6. Aspirin 81 mg a day.  7. Zoloft 50 mg daily.  8. Synthroid 75 mcg daily.  9. Clorazepate; she takes 3.75 mg q.p.m.   ALLERGIES:  SULFA.   SOCIAL HISTORY:  She does not smoke.  She drinks only very occasional  alcohol and a small amount of wine.  She lives alone.  Her daughter  resides in [facility]; her name is Rosey Bath; she can be reached  at cell phone number [phone] or [phone].   FAMILY HISTORY:  Significant for father who died of pancreatic cancer  and alcoholism.  Mother had a CVA.  She has siblings with coronary  disease and a myocardial infarction in the 7s and 1 brother who is  status post coronary artery bypass grafting also.   REVIEW OF SYSTEMS:  She has been in her usual state of health.  She  ambulates with a walker in her own home.  Otherwise, there are no other  complaints.   PHYSICAL EXAMINATION:  VITAL SIGNS: In the emergency department, her  vital signs were stable with blood pressure 159/72, temperature  96.6,  heart rate of 69, respirations 17, O2 saturation was 95%.  GENERAL:  The patient is alert, oriented, in no acute distress.  HEENT:  Head is normocephalic, atraumatic.  Extraocular muscles are  intact.  NECK:  Supple, nontender.  No palpable thyromegaly or mass.  CARDIOVASCULAR:  Exam reveals normal S1 and S2 without appreciable  murmur.  LUNGS:  Clear.  She exhibits normal effort.  There is no dullness to  percussion.  ABDOMEN:  Soft and nontender.  No rebound or guarding.  No rigidity.  EXTREMITIES:  Lower extremities reveal no edema.  No calf tenderness.  Peripheral pulses are symmetrical and palpable throughout.   LABORATORY AND ACCESSORY CLINICAL DATA:  CT scan of the head revealed no  evidence of bleed.   Sodium is 132, potassium 4.2, BUN 18, creatinine 0.8, AST/ALT 16.20,  albumin 3.8.  WBC 7.2, hemoglobin 11, platelet count 379,000, MCV of 76.   Chest x-ray was inconclusive.  There were recommendations for a followup  2-view.   ASSESSMENT:  1. Alteration of mental status, resolved, perhaps transient ischemic      attack, but it sounds more like a medication reaction to Vicodin.  2. Degenerative disk disease and chronic back pain.  3. Urinary frequency.  4. Diabetes.  5. Hypertension.  6. Obesity.   PLAN:  We will resume Mrs. Sellin's home medications, initiate aspirin and  initiate workup for acute event, either TIA or CVA.  Dr. Galen Manila  assistance is appreciated.  He recommends an EEG.  We will hold all  narcotics at that time and order physical therapy and occupational  therapy, as Mrs. Canal daughter is quite concerned and feels as  though she should not live alone.  There is some disagreement with  reference to Mrs. Westby, as she is completely alert and oriented herself  and makes appropriate decisions for herself.  In any event, I will await  conclusion of these studies.      Hettie Holstein, D.O. Electronically Signed     ESS/MEDQ  D:  05/21/2006  T:   05/21/2006  Job:  [id number]   cc:   Karie Schwalbe, MD

## 2010-07-18 NOTE — Discharge Summary (Signed)
[facility]  Patient:    [name], [name]                      MRN: [id number] Adm. Date:  [id number] Disc. Date: [id number] Attending:  Madison Hickman                           Discharge Summary  DISCHARGE DIAGNOSES: 1. Sinusitis. 2. Question postprandial hypoglycemia.  DISCHARGE MEDICATIONS:  She is to resume her preadmission medications: 1. Lisinopril 2.5 mg a day. 2. Ogen 1.25 mg q.d. 3. Clorazepate 3.75 mg q.h.s. p.r.n. 4. Indomethacin 75 mg q.d. p.r.n. 5. Zantac 150 mg q.d. 6. Zoloft 50 mg q.d. 7. Synthroid 150 mcg q.d.  FOLLOW-UP:  She is to follow up with me in one week.  Call with an update on how she is feeling in the morning.  HISTORY OF PRESENT ILLNESS:  [name] is a 77 year old woman who was admitted with a three-week history of intermittent "spells."  She was reporting that she was having episodes where she was feeling shaky, nervous, with palpitations.  Sometimes they are associated with feeling weak.  She had had episodes where she felt as though her blood sugar was low.  In fact, she checked her blood sugar several times and had found it to be in the 45-55 range.  She had no significant chest pain or shortness of breath.  She had been seen in the office and had an episode of bradycardia four days prior to admission.  The episode was transient in the office, and the EKG subsequently done in the office showed a normal sinus rhythm.  She had worn a Holter monitor over the weekend, the results of which are currently pending. Her past medical history is remarkable for diet-controlled diabetes and a hiatal hernia.  In the office we have been unable to find any evidence of low blood sugars.  A hemoglobin A1C has been checked and was normal.  Finally, she reports that she has had some facial pain and pressure for the last two weeks and some purulent nasal discharge.  HOSPITAL COURSE:  [name] was admitted for 24-hour  observation due to her symptoms.  She was placed on telemetry and remained in normal sinus rhythm throughout hospitalization with a few PVCs and one episode of bigeminy.  She had no bradycardia.  In addition, we had regular blood glucose monitoring. Her blood glucoses went from 78-199.  On the day of discharge, we contemplated doing an oral glucose tolerance test to further evaluate for postprandial hypoglycemia but, due to the fact that she had eaten, this test could not be done today.  It was elected to send her home with close follow-up.  CONDITION ON DISCHARGE:  Unchanged.  FOLLOW-UP:  She is to call me in the morning with an update on how she is feeling.  She has an appointment set up with Dr. Patty Sermons for Friday.  ADDENDUM:  On the day of discharge, [name] also related that she has had the symptoms of nasal pressure and some right-sided headache.  On exam she did, indeed, have maxillary sinus tenderness on the right.  Oropharynx was slightly injected.  We are going to call in a prescription for amoxicillin 875 b.i.d. for her to take for 10 days for probable sinusitis.  Possibly, this is contributing to her symptoms above.  LABORATORY DATA:  Blood glucoses during hospitalization were as above.  CBC and CMET done on admission were normal, apart from a sodium of 134.  Insulin and C-peptides were done at the time of admission and are normal. DD:  11/05/99 TD:  11/05/99 Job: [id number]

## 2010-07-18 NOTE — Procedures (Signed)
[facility]  Patient:    [name], [name]                      MRN: [id number] Proc. Date: 10/07/99 Adm. Date:  [id number] Attending:  Deneen Harts CC:         Darius Bump, M.D.   Procedure Report  PROCEDURE:  Colonoscopic polypectomy.  ENDOSCOPIST:  Griffith Citron, M.D.  ANESTHESIA:  INDICATIONS:  A 77 year old white female undergoing follow up colonoscopy at three year interval for neoplasia surveillance.  I last examined in 1998. _______ difficulties.  Recent widow.  Husband Max dying three months ago from multiple myeloma.  Patient quite saddened.  Otherwise, recent past history is unremarkable.  DESCRIPTION OF PROCEDURE:  After reviewing the nature of the procedure with the patient including potential risks and complications, and after discussing alternative methods of diagnosis and treatment, informed consent was signed.  The patient was premedicated receiving IV sedation, administered in divided doses totalling Versed 5 mg and fentanyl 50 mcg IV.  Using an Olympus pediatric PCF-140L video colonoscope, the rectum was intubated after a normal digital examination.  The scope was easily advanced through a sigmoid colon which previously had been quite difficult to traverse. The scope was then advanced around the remainder of the colon to the cecum, identified by the appendiceal orifice and the ileocecal valve.  Excellent preparation throughout.  The scope was slowly withdrawn with careful inspection of the entire colon in the retrograde manner including retroflexed view in the rectal vault.  Four polyps were identified, each diminutive, approximately 5 mm in diameter. These ranged from 40 cm to the anorectal verge.  Each resected with hot biopsy forceps, recovered, and submitted to pathology.  No additional ________  was identified.  The colon was decompressed and the scope withdrawn.  The patient tolerated the procedure  without difficulty, being maintained on datascope monitor and low flow oxygen throughout.  Returned to recovery in stable condition.  Time 2, technical 2, preparation 1, and total score equalled 5.  ASSESSMENT:  Colon polyps - diminutive, resected.  Pathology pending.  RECOMMENDATIONS: 1. Post-polypectomy instructions reviewed. 2. Followup pathology. 3. Repeat colonoscopy in five years - per Dr. Kinnie Scales. 4. Annual Hemoccults - per Dr. Daphine Deutscher. DD:  10/07/99 TD:  10/07/99 Job: [id number]

## 2010-07-18 NOTE — Discharge Summary (Signed)
[name], [name]                         ACCOUNT NO.:  [id number]   MEDICAL RECORD NO.:  [id number]                   PATIENT TYPE:  INP   LOCATION:  5725                                 FACILITY:  [facility]   PHYSICIAN:  Thora Lance, M.D.               DATE OF BIRTH:  05-10-1924   DATE OF ADMISSION:  04/28/2002  DATE OF DISCHARGE:  05/01/2002                                 DISCHARGE SUMMARY   REASON FOR ADMISSION:  This is a 77 year old white female who had presented  to her orthopedist, Dr. Deidre Ala, on the day of admission with left  knee pain.  She had some mild swelling in the left calf noted.  Dr. Renae Fickle  referred her for a venous Doppler ultrasound of the left leg and it showed a  DVT in the left popliteal vein and a superficial phlebitis in the greater  saphenous vein.  The patient was admitted for anticoagulation.  Her only  complaint was some left knee pain.   SIGNIFICANT FINDINGS:  Examination normal except for some mild tenderness in  the left medial knee.  There was no erythema, swelling or tenderness of the  calf.   LABORATORY DATA:  WBC 10.8, hemoglobin 12.7, platelet count 287,000.  There  was a left shift.  PT 13.3, PTT 25.  Sodium 132, potassium 3.5, chloride 98,  bicarbonate 23, glucose 226, BUN 21, creatinine 1.0, calcium 8.8, total  protein 6.9, albumin 4.0, AST 23, ALT 22, alkaline phosphatase 47, total  bilirubin 0.7.  Urinalysis negative.   Chest x-ray also unremarkable.   HOSPITAL COURSE:  The patient was admitted for a left calf DVT.  She was  placed on Lovenox and immediately started on Coumadin.  She remained stable  throughout her hospitalization.  Her hemoglobin and platelet count remained  stable.  By March 1st, she wanted to go home and was discharged with the  outpatient Lovenox and Coumadin.  Her diabetes, which is diet-controlled,  remained under good condition.   DISCHARGE DIAGNOSES:  1. Left-sided deep venous thrombosis.  2.  Diabetes mellitus, type 2, diet controlled.  3. Hypertension.  4. Hypothyroidism.  5. Fibromyalgia.  6. Degenerative joint disease.   PROCEDURES:  None.   DISCHARGE MEDICATIONS:  1. Lovenox 70 mg subcu q.12h. to be administered by her daughter.  2. Coumadin 7.5 mg q.p.m. for two nights and then we will check INR.  3. Zoloft 50 mg one p.o. q.p.m.  4. Clorazepate 2.75 mg one p.o. q.p.m.  5. Maxzide 75/50 mg one-half daily.  6. Synthroid 75 mcg one p.o. daily.  7. Zantac 150 mg one p.o. q.p.m.  8. Vitamin E daily.  9. Magnesium daily.  10.      Vitamin A and D daily.  11.      Vitamin B1 100 mg a day.   The patient's Ogen, Indocin and aspirin were stopped during this  admission.   DIET:  No restrictions.   ACTIVITY:  Activity as tolerated.   FOLLOWUP:  Follow up in two days to check INR/CBC in Dr. Excell Seltzer. Griffin's  office.  The patient will continue on Lovenox until she is therapeutic on  Coumadin.                                               Thora Lance, M.D.    Delorse Limber  D:  05/06/2002  T:  05/08/2002  Job:  [id number]   cc:   Ike Bene, M.D.  [address]  [place]  [place] [address]  Fax: [phone]

## 2010-07-18 NOTE — Procedures (Signed)
EEG NUMBER:  Is [id number].   HISTORY:  This is a 77 year old patient with a history of confusion.  The patient is being evaluated for the confusion.  This is a routine  EEG.  No skull defects noted.   MEDICATIONS:  Include clorazepate, indomethacin, Nexium, Toprol, Zoloft.   EEG CLASSIFICATION:  Dysrhythmia grade 1, left temporal.   DESCRIPTION OF THE RECORDING:  The background rhythm of this recording  consists of fairly well modulated medium amplitude alpha rhythm of 8 Hz,  that is reactive to eye open and closure.  As the record progresses, the  patient appears to remain in the waking state during the recording.  Photic stimulation was performed and revealed a minimal but bilateral  photic driving response.  Hyperventilation was not performed.  At no  time during the recording does there appear to be evidence of actual  spike or spike wave discharges, but at times there appear to be mild  subtle intermittent beta frequency slowing emanating from the left mid  temporal region.  At no time during the recording does there appear to  be evidence of actual spike or spike wave discharges.  EKG monitor shows  no evidence of cardiac rhythm abnormalities with a heart rate 78.   IMPRESSION:  This is a mildly abnormal electroencephalogram recording  due to intermittent mild slowing emanating from the left temporal area.   This suggests mild left brain dysfunction.  This recording may be due to  ischemic injury to the left brain or it could be associated with a  decreased seizure threshold, although no epileptiform discharges are  seen at this time.  Clinical correlation is required.      Marlan Palau, M.D.  Electronically Signed     [id number]  D:  05/21/2006 18:05:47  T:  05/21/2006 21:10:14  Job #:  [id number]

## 2010-09-08 ENCOUNTER — Encounter: Payer: Self-pay | Admitting: Cardiology

## 2010-09-11 ENCOUNTER — Encounter: Payer: Self-pay | Admitting: Cardiology

## 2010-09-12 ENCOUNTER — Ambulatory Visit (INDEPENDENT_AMBULATORY_CARE_PROVIDER_SITE_OTHER): Payer: Medicare Other | Admitting: Cardiology

## 2010-09-12 ENCOUNTER — Encounter: Payer: Self-pay | Admitting: Cardiology

## 2010-09-12 VITALS — BP 120/62 | HR 60 | Ht 59.0 in | Wt 144.0 lb

## 2010-09-12 DIAGNOSIS — R609 Edema, unspecified: Secondary | ICD-10-CM

## 2010-09-12 DIAGNOSIS — H538 Other visual disturbances: Secondary | ICD-10-CM

## 2010-09-12 DIAGNOSIS — I2510 Atherosclerotic heart disease of native coronary artery without angina pectoris: Secondary | ICD-10-CM

## 2010-09-12 DIAGNOSIS — E119 Type 2 diabetes mellitus without complications: Secondary | ICD-10-CM

## 2010-09-12 DIAGNOSIS — I10 Essential (primary) hypertension: Secondary | ICD-10-CM

## 2010-09-12 NOTE — Assessment & Plan Note (Signed)
Lower extremity edema has resolved. No further workup or treatment needed.

## 2010-09-12 NOTE — Progress Notes (Signed)
[name] Date of Birth: [birth year]   History of Present Illness: [name] is seen for followup today. She has been doing very well from a cardiac standpoint. She continues to feel very well since her Toprol dose was reduced. She also is no longer taking Paxil because she states this made her feel short of breath and causes her to break out. She has rarely had any swelling in her ankles. She denies any chest pain or shortness of breath currently.  Current Outpatient Prescriptions on File Prior to Visit  Medication Sig Dispense Refill  . amLODipine (NORVASC) 10 MG tablet Take 5 mg by mouth daily.        Marland Kitchen aspirin 81 MG tablet Take 81 mg by mouth daily.        . clorazepate (TRANXENE) 3.75 MG tablet Take 3.75 mg by mouth at bedtime. 1/2 tablet       . esomeprazole (NEXIUM) 40 MG capsule Take 40 mg by mouth daily before breakfast.        . furosemide (LASIX) 20 MG tablet Take 20 mg by mouth daily. 10 mg daily       . indomethacin (INDOCIN SR) 75 MG CR capsule Take 75 mg by mouth at bedtime.        Marland Kitchen levothyroxine (SYNTHROID, LEVOTHROID) 75 MCG tablet Take 75 mcg by mouth daily.        Marland Kitchen LOTEMAX 0.5 % OINT Apply 1 drop to eye as directed.       . meclizine (ANTIVERT) 25 MG tablet Take 25 mg by mouth at bedtime.        . metFORMIN (GLUCOPHAGE) 500 MG tablet Take 500 mg by mouth 2 (two) times daily with a meal.        . metoprolol (TOPROL XL) 100 MG 24 hr tablet Take 1 tablet (100 mg total) by mouth daily.  30 tablet  5  . Multiple Vitamin (MULTIVITAMIN PO) Take 1 tablet by mouth daily.        . NON FORMULARY Take 1 tablet by mouth daily. Stool softner       . ranitidine (ZANTAC) 75 MG tablet Take 75 mg by mouth at bedtime.        . RESTASIS 0.05 % ophthalmic emulsion Place 1 drop into both eyes every 12 (twelve) hours.       . sertraline (ZOLOFT) 50 MG tablet Take 50 mg by mouth daily.        Marland Kitchen tiotropium (SPIRIVA) 18 MCG inhalation capsule Place 18 mcg into inhaler and inhale as  needed.          Allergies  Allergen Reactions  . Aricept (Donepezil Hydrochloride)     REACTION: delusions  . Hydrocodone-Acetaminophen     REACTION: U/K  . Metoprolol     REACTION: dizzy  . Nitrofurantoin   . Sulfonamide Derivatives     REACTION: unspecified    Past Medical History  Diagnosis Date  . Hypertension   . Diabetes mellitus   . Thyroid disease   . Vertigo   . Anemia   . Osteoarthritis   . GERD (gastroesophageal reflux disease)   . Diverticulosis   . Depression   . H/O: hysterectomy     at age 38  . COPD (chronic obstructive pulmonary disease)   . History of DVT (deep vein thrombosis)   . Cancer     basal cell  . Coronary artery disease   . Anxiety     Past Surgical  History  Procedure Date  . Cholecystectomy 1944  . Appendectomy 1952  . Ovarian cyst removal 1982    secondary ventral hematoma  . Bladder tack 1997  . Cataract extraction, bilateral   . Kyphosis surgery     History  Smoking status  . Never Smoker   Smokeless tobacco  . Not on file    History  Alcohol Use No    Family History  Problem Relation Age of Onset  . Stroke Mother   . Cancer Father   . Kidney disease Sister   . Heart disease Brother   . Stroke Brother   . Heart disease Brother     Review of Systems: The review of systems is positive for difficulties with her vision and dry eyes. She suffered a torn muscle in her right hip and this is being managed conservatively.  All other systems were reviewed and are negative.  Physical Exam: BP 120/62  Pulse 60  Ht 4\' 11"  (1.499 m)  Wt 144 lb (65.318 kg)  BMI 29.08 kg/m2 She is an obese white female in no acute distress. She is normocephalic, atraumatic. Pupils equal round and reactive to light and accommodation. Extraocular movements are full. Oropharynx is clear. Neck is supple no JVD, adenopathy, thyromegaly, or bruits. Lungs are clear. Cardiac exam reveals a regular rate and rhythm without gallop, murmur, or click.  Abdomen is soft and nontender without masses or bruits. Femoral and pedal pulses are 2+. She has no edema. Skin is warm and dry. She is alert and oriented x3. Cranial nerves II through XII are intact. LABORATORY DATA:   Assessment / Plan:

## 2010-09-12 NOTE — Patient Instructions (Signed)
We will schedule you for carotid doppler study.  Continue your current medications.  I will see you again in 1 year.

## 2010-09-12 NOTE — Assessment & Plan Note (Signed)
Blood pressure is well controlled on current medications. 

## 2010-09-22 ENCOUNTER — Encounter (INDEPENDENT_AMBULATORY_CARE_PROVIDER_SITE_OTHER): Payer: Medicare Other | Admitting: Cardiology

## 2010-09-22 DIAGNOSIS — H53129 Transient visual loss, unspecified eye: Secondary | ICD-10-CM

## 2010-09-22 DIAGNOSIS — I6529 Occlusion and stenosis of unspecified carotid artery: Secondary | ICD-10-CM

## 2010-09-22 DIAGNOSIS — H538 Other visual disturbances: Secondary | ICD-10-CM

## 2010-09-24 ENCOUNTER — Encounter: Payer: Self-pay | Admitting: Cardiology

## 2010-09-25 ENCOUNTER — Telehealth: Payer: Self-pay | Admitting: *Deleted

## 2010-09-25 NOTE — Telephone Encounter (Signed)
Notified of doppler report. Will send results to Tomi Bamberger and Dr. Redmond Baseman at [facility].

## 2010-09-25 NOTE — Telephone Encounter (Signed)
Message copied by Lorayne Bender on Thu Sep 25, 2010  4:23 PM ------      Message from: [place], PETER M      Created: Wed Sep 24, 2010  2:39 PM       Nonobstructive plaque in carotids <40%.

## 2010-11-18 ENCOUNTER — Other Ambulatory Visit: Payer: Self-pay | Admitting: *Deleted

## 2010-11-18 NOTE — Telephone Encounter (Signed)
Patient has a new doctor and will call [facility] and let them know she is not a patient here anymore.

## 2010-11-18 NOTE — Telephone Encounter (Signed)
Call  Received refill request -- I have not seen her in a year.   If she has gotten a new MD, then this should go to them.  If still planning on coming back to see [place], then ok to refill #30 with 1 refill. But should have a 30 minute follow-up in the next 1-2 months

## 2010-11-19 ENCOUNTER — Other Ambulatory Visit: Payer: Self-pay | Admitting: Internal Medicine

## 2010-11-19 ENCOUNTER — Other Ambulatory Visit: Payer: Self-pay | Admitting: Family Medicine

## 2010-11-19 DIAGNOSIS — Z1231 Encounter for screening mammogram for malignant neoplasm of breast: Secondary | ICD-10-CM

## 2010-12-09 ENCOUNTER — Emergency Department: Payer: Medicare Other | Admitting: Emergency Medicine

## 2010-12-23 ENCOUNTER — Ambulatory Visit: Payer: Medicare Other

## 2011-01-08 ENCOUNTER — Ambulatory Visit: Payer: Medicare Other

## 2011-02-02 ENCOUNTER — Ambulatory Visit: Payer: Medicare Other

## 2011-03-10 ENCOUNTER — Ambulatory Visit (INDEPENDENT_AMBULATORY_CARE_PROVIDER_SITE_OTHER): Payer: Medicare Other | Admitting: Cardiology

## 2011-03-10 ENCOUNTER — Encounter: Payer: Self-pay | Admitting: Cardiology

## 2011-03-10 VITALS — BP 140/82 | HR 61 | Ht <= 58 in | Wt 138.4 lb

## 2011-03-10 DIAGNOSIS — S0990XA Unspecified injury of head, initial encounter: Secondary | ICD-10-CM

## 2011-03-10 DIAGNOSIS — I10 Essential (primary) hypertension: Secondary | ICD-10-CM

## 2011-03-10 DIAGNOSIS — R42 Dizziness and giddiness: Secondary | ICD-10-CM

## 2011-03-10 DIAGNOSIS — R609 Edema, unspecified: Secondary | ICD-10-CM

## 2011-03-10 NOTE — Progress Notes (Signed)
[name] Date of Birth: [birth year]   History of Present Illness: [name] is seen today as a work in. She states she's not been doing well. This past Thursday she tried to get up out of her bed to go into an adjoining room. She states that she did not become dizzy but the suddenly everything "went". She fell and hit her head on her bed. She scraped her arm. She has had some mild headaches. She complains of her balance is off. She doesn't feel right on her feet. She has had no diplopia or blurred vision. She denies any chest pain or shortness of breath. She has had no further edema.  Current Outpatient Prescriptions on File Prior to Visit  Medication Sig Dispense Refill  . amLODipine (NORVASC) 10 MG tablet Take 5 mg by mouth daily.        Marland Kitchen aspirin 81 MG tablet Take 81 mg by mouth daily.        . clorazepate (TRANXENE) 3.75 MG tablet Take 3.75 mg by mouth at bedtime. 1/2 tablet       . esomeprazole (NEXIUM) 40 MG capsule Take 40 mg by mouth daily before breakfast.        . furosemide (LASIX) 20 MG tablet Take 20 mg by mouth daily. 10 mg daily       . indomethacin (INDOCIN SR) 75 MG CR capsule Take 75 mg by mouth at bedtime.        Marland Kitchen levothyroxine (SYNTHROID, LEVOTHROID) 75 MCG tablet Take 75 mcg by mouth daily.        Marland Kitchen LOTEMAX 0.5 % OINT Apply 1 drop to eye as directed.       . meclizine (ANTIVERT) 25 MG tablet Take 25 mg by mouth 2 (two) times daily.       . metFORMIN (GLUCOPHAGE) 500 MG tablet Take 750 mg by mouth 2 (two) times daily with a meal.       . metoprolol (TOPROL XL) 100 MG 24 hr tablet Take 1 tablet (100 mg total) by mouth daily.  30 tablet  5  . Multiple Vitamin (MULTIVITAMIN PO) Take 1 tablet by mouth daily.        . NON FORMULARY Take 1 tablet by mouth daily. Stool softner       . ranitidine (ZANTAC) 75 MG tablet Take 75 mg by mouth at bedtime.        . RESTASIS 0.05 % ophthalmic emulsion Place 1 drop into both eyes every 12 (twelve) hours.       . sertraline  (ZOLOFT) 50 MG tablet Take 50 mg by mouth daily.        Marland Kitchen tiotropium (SPIRIVA) 18 MCG inhalation capsule Place 18 mcg into inhaler and inhale as needed.          Allergies  Allergen Reactions  . Aricept (Donepezil Hydrochloride)     REACTION: delusions  . Hydrocodone-Acetaminophen     REACTION: U/K  . Metoprolol     REACTION: dizzy  . Nitrofurantoin   . Sulfonamide Derivatives     REACTION: unspecified    Past Medical History  Diagnosis Date  . Hypertension   . Diabetes mellitus   . Thyroid disease   . Vertigo   . Anemia   . Osteoarthritis   . GERD (gastroesophageal reflux disease)   . Diverticulosis   . Depression   . H/O: hysterectomy     at age 73  . COPD (chronic obstructive pulmonary disease)   .  History of DVT (deep vein thrombosis)   . Cancer     basal cell  . Coronary artery disease   . Anxiety   . Dyspnea   . Enlarged heart   . Fibromyalgia   . Degenerative joint disease   . Degenerative disk disease   . Vertigo   . Tremor     Past Surgical History  Procedure Date  . Cholecystectomy 1944  . Appendectomy 1952  . Ovarian cyst removal 1982    secondary ventral hematoma  . Bladder tack 1997  . Cataract extraction, bilateral   . Kyphosis surgery   . Total abdominal hysterectomy   . Salpingoophorectomy     unilateral salpingo-oophorectomy    History  Smoking status  . Never Smoker   Smokeless tobacco  . Not on file    History  Alcohol Use No    Family History  Problem Relation Age of Onset  . Stroke Mother   . Cancer Father     Pancreatic Cancer  . Kidney disease Sister   . Heart disease Brother   . Stroke Brother   . Heart disease Brother     Review of Systems: As noted in history of present illness.  All other systems were reviewed and are negative.  Physical Exam: BP 140/82  Pulse 61  Ht 4\' 10"  (1.473 m)  Wt 138 lb 6.4 oz (62.778 kg)  BMI 28.93 kg/m2 She is an obese white female in no acute distress. She is normocephalic,  atraumatic. Pupils equal round and reactive to light and accommodation. Extraocular movements are full. Oropharynx is clear. Neck is supple no JVD, adenopathy, thyromegaly, or bruits. Lungs are clear. Cardiac exam reveals a regular rate and rhythm without gallop, murmur, or click. Abdomen is soft and nontender without masses or bruits. Femoral and pedal pulses are 2+. She has no edema. Skin is warm and dry. She is alert and oriented x3. Cranial nerves II through XII are intact. She does have some disequilibrium with her gait. LABORATORY DATA:   Assessment / Plan:

## 2011-03-10 NOTE — Assessment & Plan Note (Signed)
She did have a recent fall and hit her head. She complains of increased disequilibrium. She does carry a diagnosis of chronic vertigo but this sounds different. We will obtain a noncontrast CT to make sure she is not suffered an intracranial hemorrhage. She will plan follow up with her primary care. I will see her back again in one year.

## 2011-03-10 NOTE — Patient Instructions (Signed)
We will schedule you for a CT of your head. If this is normal then you need to follow up with your primary care.  I will see you again in 1 year.

## 2011-03-10 NOTE — Assessment & Plan Note (Signed)
Her leg edema has completely resolved.

## 2011-03-10 NOTE — Assessment & Plan Note (Signed)
Blood pressure is well-controlled today. 

## 2011-03-11 ENCOUNTER — Ambulatory Visit (INDEPENDENT_AMBULATORY_CARE_PROVIDER_SITE_OTHER)
Admission: RE | Admit: 2011-03-11 | Discharge: 2011-03-11 | Disposition: A | Discharge status: Home / Self Care | Payer: Medicare Other | Source: Ambulatory Visit | Attending: Cardiology | Admitting: Cardiology

## 2011-03-11 ENCOUNTER — Telehealth: Payer: Self-pay | Admitting: Cardiology

## 2011-03-11 DIAGNOSIS — S0990XA Unspecified injury of head, initial encounter: Secondary | ICD-10-CM

## 2011-03-11 DIAGNOSIS — R42 Dizziness and giddiness: Secondary | ICD-10-CM

## 2011-03-11 NOTE — Telephone Encounter (Signed)
Spoke with patient and told no acute findings on CT head.

## 2011-03-11 NOTE — Telephone Encounter (Signed)
Will forward to [facility], Dr Elvis Coil nurse

## 2011-03-11 NOTE — Telephone Encounter (Signed)
New problem:  Patient had ct scan today. Would like a call back today.

## 2011-04-07 ENCOUNTER — Encounter: Payer: Self-pay | Admitting: Nurse Practitioner

## 2011-04-07 ENCOUNTER — Ambulatory Visit (INDEPENDENT_AMBULATORY_CARE_PROVIDER_SITE_OTHER): Payer: Medicare Other | Admitting: Nurse Practitioner

## 2011-04-07 VITALS — BP 144/80 | HR 68 | Ht 59.0 in | Wt 135.0 lb

## 2011-04-07 DIAGNOSIS — R55 Syncope and collapse: Secondary | ICD-10-CM

## 2011-04-07 DIAGNOSIS — W19XXXA Unspecified fall, initial encounter: Secondary | ICD-10-CM | POA: Insufficient documentation

## 2011-04-07 DIAGNOSIS — R5381 Other malaise: Secondary | ICD-10-CM

## 2011-04-07 DIAGNOSIS — R5383 Other fatigue: Secondary | ICD-10-CM

## 2011-04-07 NOTE — Patient Instructions (Signed)
We are going to put a monitor on you for the next month to see if these "falling out" spells are coming from your heart.   We will check your labs today.   I think you need to consider talking with your children regarding your living arrangements.   Call the [facility] office at [phone] if you have any questions, problems or concerns.

## 2011-04-07 NOTE — Progress Notes (Signed)
[name] Date of Birth: 05/15/1924 Medical Record #[id number]  History of Present Illness: [name] is seen today for a work in visit. She is seen for Dr. [place]. She is not doing well and says "someone has to help me". She was here a month ago after having a "blanked out spell". She had hit her head. CT was ok. She was told to follow up with her PCP.  She comes in today. She is here with a friend. She has had several more falls in the interim. [place] says she "goes blank". It is assumed that she is passing out. Her history is hard to follow. Her memory is poor. Her friend notes that she is more nervous and agitated. She does have a life line but does not wear it all the time. She is not dizzy. She does feel exhausted. Says she has lost a lot of weight over the last year and does not know why. She no longer has a PCP but will be establishing care in April at [facility] with Dr. Burnett Sheng. Fortunately, she has not suffered a major injury, just a few scrapes and bruises. She does not have chest pain. She denies palpitations.   Current Outpatient Prescriptions on File Prior to Visit  Medication Sig Dispense Refill  . aspirin 81 MG tablet Take 81 mg by mouth daily.        Marland Kitchen esomeprazole (NEXIUM) 40 MG capsule Take 40 mg by mouth daily before breakfast.        . furosemide (LASIX) 20 MG tablet Take 20 mg by mouth as needed.       . indomethacin (INDOCIN SR) 75 MG CR capsule Take 75 mg by mouth at bedtime.        Marland Kitchen levothyroxine (SYNTHROID, LEVOTHROID) 75 MCG tablet Take 75 mcg by mouth daily.        Marland Kitchen LOTEMAX 0.5 % OINT Apply 1 drop to eye as directed.       . meclizine (ANTIVERT) 25 MG tablet Take 25 mg by mouth 2 (two) times daily.       . metoprolol (TOPROL XL) 100 MG 24 hr tablet Take 1 tablet (100 mg total) by mouth daily.  30 tablet  5  . Multiple Vitamin (MULTIVITAMIN PO) Take 1 tablet by mouth daily.        . NON FORMULARY Take 1 tablet by mouth daily. Stool softner      .  ranitidine (ZANTAC) 75 MG tablet Take 75 mg by mouth at bedtime.        . RESTASIS 0.05 % ophthalmic emulsion Place 1 drop into both eyes every 12 (twelve) hours.       . sertraline (ZOLOFT) 50 MG tablet Take 50 mg by mouth daily.        Marland Kitchen tiotropium (SPIRIVA) 18 MCG inhalation capsule Place 18 mcg into inhaler and inhale as needed.        Marland Kitchen amLODipine (NORVASC) 10 MG tablet Take 5 mg by mouth daily.          Allergies  Allergen Reactions  . Aricept (Donepezil Hydrochloride)     REACTION: delusions  . Hydrocodone-Acetaminophen     REACTION: U/K  . Metoprolol     REACTION: dizzy  . Nitrofurantoin   . Sulfonamide Derivatives     REACTION: unspecified    Past Medical History  Diagnosis Date  . Hypertension   . Diabetes mellitus   . Hypothyroidism   . Vertigo   .  Anemia   . Osteoarthritis   . GERD (gastroesophageal reflux disease)   . Diverticulosis   . Depression   . H/O: hysterectomy     at age 17  . COPD (chronic obstructive pulmonary disease)   . History of DVT (deep vein thrombosis)   . Cancer     basal cell  . Anxiety   . Dyspnea   . Enlarged heart   . Fibromyalgia   . Degenerative joint disease   . Degenerative disk disease   . Tremor   . Falls     Past Surgical History  Procedure Date  . Cholecystectomy 1944  . Appendectomy 1952  . Ovarian cyst removal 1982    secondary ventral hematoma  . Bladder tack 1997  . Cataract extraction, bilateral   . Kyphosis surgery   . Total abdominal hysterectomy   . Salpingoophorectomy     unilateral salpingo-oophorectomy    History  Smoking status  . Never Smoker   Smokeless tobacco  . Not on file    History  Alcohol Use No    Family History  Problem Relation Age of Onset  . Stroke Mother   . Cancer Father     Pancreatic Cancer  . Kidney disease Sister   . Heart disease Brother   . Stroke Brother   . Heart disease Brother     Review of Systems: The review of systems is per the HPI.  All other  systems were reviewed and are negative.  Physical Exam: BP 144/80  Pulse 68  Ht 4\' 11"  (1.499 m)  Wt 135 lb (61.236 kg)  BMI 27.27 kg/m2 Patient is very pleasant and in no acute distress. She is a poor historian. Skin is warm and dry. Color is normal.  HEENT is unremarkable. Normocephalic/atraumatic. PERRL. Sclera are nonicteric. Neck is supple. No masses. No JVD. Lungs are clear. Cardiac exam shows a regular rate and rhythm. Abdomen is soft. Extremities are without edema. Gait and ROM are intact. No gross neurologic deficits noted.   LABORATORY DATA:   Assessment / Plan:

## 2011-04-07 NOTE — Assessment & Plan Note (Signed)
I'm not sure I would call this syncopal spells. She is not really able to give much detail. We will check her labs today and arrange for an event monitor. I think this is more of an issue of where her living arrangements needs to be addressed. She has two children, but they are out of town. She is not willing to talk to them at this time. I have asked her to wear her life line. We will see what her labs look like. See what the event monitor shows. I have encouraged her to see her PCP as soon as possible. Patient is agreeable to this plan and will call if any problems develop in the interim.

## 2011-04-08 LAB — BASIC METABOLIC PANEL
BUN: 22 mg/dL (ref 6–23)
CO2: 24 mEq/L (ref 19–32)
Calcium: 9.2 mg/dL (ref 8.4–10.5)
Chloride: 96 mEq/L (ref 96–112)
Creatinine, Ser: 0.7 mg/dL (ref 0.4–1.2)
GFR: 81.98 mL/min (ref >60.00)
Glucose, Bld: 115 mg/dL — ABNORMAL HIGH (ref 70–99)
Potassium: 4.3 mEq/L (ref 3.5–5.1)
Sodium: 129 mEq/L — ABNORMAL LOW (ref 135–145)

## 2011-04-08 LAB — URINALYSIS, ROUTINE W REFLEX MICROSCOPIC
Hgb urine dipstick: NEGATIVE
Leukocytes, UA: NEGATIVE
Nitrite: NEGATIVE
Specific Gravity, Urine: >=1.03 (ref 1.000–1.030)
Total Protein, Urine: 100
Urine Glucose: NEGATIVE
Urobilinogen, UA: 0.2 (ref 0.0–1.0)
pH: 5.5 (ref 5.0–8.0)

## 2011-04-08 LAB — CBC WITH DIFFERENTIAL/PLATELET
Basophils Absolute: 0.2 10*3/uL — ABNORMAL HIGH (ref 0.0–0.1)
Basophils Relative: 1.3 % (ref 0.0–3.0)
Eosinophils Absolute: 0.2 10*3/uL (ref 0.0–0.7)
Eosinophils Relative: 1.7 % (ref 0.0–5.0)
HCT: 36 % (ref 36.0–46.0)
Hemoglobin: 12.1 g/dL (ref 12.0–15.0)
Lymphocytes Relative: 18.7 % (ref 12.0–46.0)
Lymphs Abs: 2.6 10*3/uL (ref 0.7–4.0)
MCHC: 33.6 g/dL (ref 30.0–36.0)
MCV: 80.4 fl (ref 78.0–100.0)
Monocytes Absolute: 0.2 10*3/uL (ref 0.1–1.0)
Monocytes Relative: 1.3 % — ABNORMAL LOW (ref 3.0–12.0)
Neutro Abs: 10.8 10*3/uL — ABNORMAL HIGH (ref 1.4–7.7)
Neutrophils Relative %: 77 % (ref 43.0–77.0)
Platelets: 314 10*3/uL (ref 150.0–400.0)
RBC: 4.47 Mil/uL (ref 3.87–5.11)
RDW: 15.5 % — ABNORMAL HIGH (ref 11.5–14.6)
WBC: 14 10*3/uL — ABNORMAL HIGH (ref 4.5–10.5)

## 2011-04-08 LAB — TSH: TSH: 0.91 u[IU]/mL (ref 0.35–5.50)

## 2011-04-08 LAB — HEPATIC FUNCTION PANEL
ALT: 18 U/L (ref 0–35)
AST: 20 U/L (ref 0–37)
Albumin: 3.8 g/dL (ref 3.5–5.2)
Alkaline Phosphatase: 46 U/L (ref 39–117)
Bilirubin, Direct: 0.1 mg/dL (ref 0.0–0.3)
Total Bilirubin: 0.4 mg/dL (ref 0.3–1.2)
Total Protein: 6.3 g/dL (ref 6.0–8.3)

## 2011-04-09 ENCOUNTER — Telehealth: Payer: Self-pay | Admitting: Cardiology

## 2011-04-09 NOTE — Telephone Encounter (Signed)
Spoke to [place] at Dr.Hedrick's office, appointment scheduled with Dr.Hedrick's PA tomorrow 04/10/11.Patient to keep appointment with Dr.Hedrick 04/16/11.Patient was called and told of appointment.

## 2011-04-09 NOTE — Telephone Encounter (Signed)
Patient called,patient upset wanting sooner appointment with Dr.Hedrick.States has appointment with Dr.Hedrick 04/16/11.Dr.Hedricks office was called,spoke to [place] she will work on a sooner appointment and let us know.

## 2011-04-09 NOTE — Telephone Encounter (Signed)
PT WAS TOLD SHE HAD "SHOCKING" RESULTS FROM BLOOD WORK, AND WAS TO GET AN APPT IN [place], THEY GAVE HER 04-16-11, SHE WANTS TO TALK TO THE NURSE

## 2011-04-16 ENCOUNTER — Telehealth: Payer: Self-pay | Admitting: Cardiology

## 2011-04-16 NOTE — Telephone Encounter (Signed)
New Problem   Nurse Selena Batten-  Dr. Burnett Sheng Office   [phone]  Request return call regarding mutual patient.

## 2011-04-16 NOTE — Telephone Encounter (Signed)
Dr.Hedrick's nurse called wanting to know can patient's event monitor attach on a strap to wear around her neck.Patient was called and told to look in event monitor box and she will find a black strap that attaches to monitor and she can wear monitor around neck.

## 2011-05-05 ENCOUNTER — Other Ambulatory Visit: Payer: Self-pay

## 2011-05-05 MED ORDER — METOPROLOL SUCCINATE ER 100 MG PO TB24: 100.0000 mg | ORAL_TABLET | Freq: Every day | ORAL | Status: DC

## 2011-06-23 ENCOUNTER — Ambulatory Visit: Payer: Self-pay | Admitting: Family Medicine

## 2011-07-07 ENCOUNTER — Ambulatory Visit: Payer: Self-pay | Admitting: Family Medicine

## 2011-11-09 ENCOUNTER — Telehealth: Payer: Self-pay | Admitting: Hematology & Oncology

## 2011-11-09 NOTE — Telephone Encounter (Signed)
Left pt message to call for appointment °

## 2011-11-11 ENCOUNTER — Telehealth: Payer: Self-pay

## 2011-11-11 NOTE — Telephone Encounter (Signed)
Received call from [facility] in Clinic stated patient is there complaining of dizzy,chest pain.B/P 164/80 pulse 64.Patient told them she stopped her amlodipine due to swelling in legs.They are going to send patient to [facility] ER.Appointment scheduled with Norma Fredrickson NP 11/16/11.

## 2011-11-16 ENCOUNTER — Ambulatory Visit (INDEPENDENT_AMBULATORY_CARE_PROVIDER_SITE_OTHER): Payer: Medicare Other | Admitting: Nurse Practitioner

## 2011-11-16 ENCOUNTER — Telehealth: Payer: Self-pay | Admitting: *Deleted

## 2011-11-16 ENCOUNTER — Encounter: Payer: Self-pay | Admitting: Nurse Practitioner

## 2011-11-16 VITALS — BP 160/80 | HR 58 | Ht <= 58 in | Wt 129.8 lb

## 2011-11-16 DIAGNOSIS — I10 Essential (primary) hypertension: Secondary | ICD-10-CM

## 2011-11-16 DIAGNOSIS — W19XXXA Unspecified fall, initial encounter: Secondary | ICD-10-CM

## 2011-11-16 LAB — CBC WITH DIFFERENTIAL/PLATELET
Basophils Absolute: 0 10*3/uL (ref 0.0–0.1)
Basophils Relative: 0.3 % (ref 0.0–3.0)
Eosinophils Absolute: 0.2 10*3/uL (ref 0.0–0.7)
Eosinophils Relative: 1.3 % (ref 0.0–5.0)
HCT: 36.8 % (ref 36.0–46.0)
Hemoglobin: 11.5 g/dL — ABNORMAL LOW (ref 12.0–15.0)
Lymphocytes Relative: 22.2 % (ref 12.0–46.0)
Lymphs Abs: 2.6 10*3/uL (ref 0.7–4.0)
MCHC: 31.4 g/dL (ref 30.0–36.0)
MCV: 79.2 fl (ref 78.0–100.0)
Monocytes Absolute: 1 10*3/uL (ref 0.1–1.0)
Monocytes Relative: 8.8 % (ref 3.0–12.0)
Neutro Abs: 7.9 10*3/uL — ABNORMAL HIGH (ref 1.4–7.7)
Neutrophils Relative %: 67.4 % (ref 43.0–77.0)
Platelets: 301 10*3/uL (ref 150.0–400.0)
RBC: 4.65 Mil/uL (ref 3.87–5.11)
RDW: 20.3 % — ABNORMAL HIGH (ref 11.5–14.6)
WBC: 11.8 10*3/uL — ABNORMAL HIGH (ref 4.5–10.5)

## 2011-11-16 LAB — BASIC METABOLIC PANEL
BUN: 18 mg/dL (ref 6–23)
CO2: 28 mEq/L (ref 19–32)
Calcium: 9.1 mg/dL (ref 8.4–10.5)
Chloride: 94 mEq/L — ABNORMAL LOW (ref 96–112)
Creatinine, Ser: 0.7 mg/dL (ref 0.4–1.2)
GFR: 90.5 mL/min (ref >60.00)
Glucose, Bld: 95 mg/dL (ref 70–99)
Potassium: 3.8 mEq/L (ref 3.5–5.1)
Sodium: 131 mEq/L — ABNORMAL LOW (ref 135–145)

## 2011-11-16 MED ORDER — HYDRALAZINE HCL 25 MG PO TABS: 25.0000 mg | ORAL_TABLET | Freq: Two times a day (BID) | ORAL | Status: DC

## 2011-11-16 NOTE — Progress Notes (Signed)
[name] Date of Birth: 1924-11-07 Medical Record #[id number]  History of Present Illness: [name] is seen today for a work in visit. She is seen for Dr. [place]. She has a history of HTN, DM, hypothyroidism, vertigo, anemia, OA, GERD, depression. COPD, prior DVT, DJD and recurrent falls. I saw her back in February with "blanked out spell". Event monitor was ok. She was referred back to her PCP and has not been seen since February.  She comes in today. She is here with [name], her friend and POA. [name] provides most of the history. [name] has continued to have falls. Sees neurology later this month. Apparently has issues with her sodium and being anemic. She is set up to see Dr. Myna Hidalgo. Doesn't sound like she has had a recent GI workup. Her history is quite difficult to follow. She tells me she is tired. BP has been up. [name] stopped her Norvasc due to some swelling and ended up in the walk in clinic down in [place]. Norvasc was added back. Sugars have been labile. [name] thinks she has been pretty compliant with her medicines. They are looking at assisted living options. She has had multiple falls with no significant injury reported. She says she is not short of breath. Primary complaint is that of fatigue.   Current Outpatient Prescriptions on File Prior to Visit  Medication Sig Dispense Refill  . amLODipine (NORVASC) 10 MG tablet Take 5 mg by mouth daily.        Marland Kitchen aspirin 81 MG tablet Take 81 mg by mouth daily.        Marland Kitchen esomeprazole (NEXIUM) 40 MG capsule Take 40 mg by mouth daily before breakfast.        . furosemide (LASIX) 20 MG tablet Take 20 mg by mouth as needed.       . indomethacin (INDOCIN SR) 75 MG CR capsule Take 75 mg by mouth at bedtime.        Marland Kitchen levothyroxine (SYNTHROID, LEVOTHROID) 75 MCG tablet Take 75 mcg by mouth daily.        Marland Kitchen LOTEMAX 0.5 % OINT Apply 1 drop to eye as directed.       . meclizine (ANTIVERT) 25 MG tablet Take 25 mg by mouth 2 (two) times  daily.       . metFORMIN (GLUCOPHAGE XR) 750 MG 24 hr tablet Take 750 mg by mouth 2 (two) times daily.      . metoprolol succinate (TOPROL XL) 100 MG 24 hr tablet Take 1 tablet (100 mg total) by mouth daily.  30 tablet  9  . Multiple Vitamin (MULTIVITAMIN PO) Take 1 tablet by mouth daily.        . NON FORMULARY Take 1 tablet by mouth daily. Stool softner      . ranitidine (ZANTAC) 75 MG tablet Take 75 mg by mouth at bedtime.        . RESTASIS 0.05 % ophthalmic emulsion Place 1 drop into both eyes every 12 (twelve) hours.       . sertraline (ZOLOFT) 50 MG tablet Take 50 mg by mouth daily.        Marland Kitchen tiotropium (SPIRIVA) 18 MCG inhalation capsule Place 18 mcg into inhaler and inhale as needed.        . hydrALAZINE (APRESOLINE) 25 MG tablet Take 1 tablet (25 mg total) by mouth 2 (two) times daily.  60 tablet  3    Allergies  Allergen Reactions  . Aricept (Donepezil Hydrochloride)  REACTION: delusions  . Hydrocodone-Acetaminophen     REACTION: U/K  . Metoprolol     REACTION: dizzy  . Nitrofurantoin   . Sulfonamide Derivatives     REACTION: unspecified    Past Medical History  Diagnosis Date  . Hypertension   . Diabetes mellitus   . Hypothyroidism   . Vertigo   . Anemia   . Osteoarthritis   . GERD (gastroesophageal reflux disease)   . Diverticulosis   . Depression   . H/O: hysterectomy     at age 55  . COPD (chronic obstructive pulmonary disease)   . History of DVT (deep vein thrombosis)   . Cancer     basal cell  . Anxiety   . Dyspnea   . Enlarged heart   . Fibromyalgia   . Degenerative joint disease   . Degenerative disk disease   . Tremor   . Falls     Past Surgical History  Procedure Date  . Cholecystectomy 1944  . Appendectomy 1952  . Ovarian cyst removal 1982    secondary ventral hematoma  . Bladder tack 1997  . Cataract extraction, bilateral   . Kyphosis surgery   . Total abdominal hysterectomy   . Salpingoophorectomy     unilateral  salpingo-oophorectomy    History  Smoking status  . Never Smoker   Smokeless tobacco  . Not on file    History  Alcohol Use No    Family History  Problem Relation Age of Onset  . Stroke Mother   . Cancer Father     Pancreatic Cancer  . Kidney disease Sister   . Heart disease Brother   . Stroke Brother   . Heart disease Brother     Review of Systems: The review of systems is per the HPI.  All other systems were reviewed and are negative.  Physical Exam: BP 160/80  Pulse 58  Ht 4\' 9"  (1.448 m)  Wt 129 lb 12.8 oz (58.877 kg)  BMI 28.09 kg/m2 Patient is a little agitated but in no acute distress. Her history is hard to obtain. Skin is warm and dry. Color is normal.  HEENT is unremarkable. Normocephalic/atraumatic. PERRL. Sclera are nonicteric. Neck is supple. No masses. No JVD. Lungs are coarse. Cardiac exam shows a regular rate and rhythm. Abdomen is soft. Extremities are without edema. Gait and ROM are intact. No gross neurologic deficits noted.  LABORATORY DATA: EKG today shows sinus rhythm. No acute changes noted.   Repeat labs today are pending.   Lab Results  Component Value Date   WBC 14.0* 04/07/2011   HGB 12.1 04/07/2011   HCT 36.0 04/07/2011   PLT 314.0 04/07/2011   GLUCOSE 115* 04/07/2011   CHOL 176 09/29/2006   TRIG 120 09/29/2006   HDL 46.8 09/29/2006   LDLDIRECT 121.6 01/07/2009   LDLCALC 105* 09/29/2006   ALT 18 04/07/2011   AST 20 04/07/2011   NA 129* 04/07/2011   K 4.3 04/07/2011   CL 96 04/07/2011   CREATININE 0.7 04/07/2011   BUN 22 04/07/2011   CO2 24 04/07/2011   TSH 0.91 04/07/2011   HGBA1C 7.7* 06/10/2009   MICROALBUR 15.4* 10/10/2008    Assessment / Plan: 1. HTN - BP is up but some better my recheck. She is on Norvasc and Toprol. Not using the Lasix. Apparently has had issues with her sodium and has been using salt as well to help this. I have added hydralazine 25 mg BID. Will avoid ACE/ARB for now.  I still wonder about overall compliance issues. She does monitor  her blood pressure at home and I have asked her to keep a record for me. Will check some baseline labs today. I will see her back in about 2 weeks.   2. Falls - no significant injury as of yet. She is looking into assisted living options. I think this is a good idea.  We will check some labs today. Her anemia evaluation and neurology evaluation are in process. I will see her back in about 2 weeks. Patient is agreeable to this plan and will call if any problems develop in the interim.

## 2011-11-16 NOTE — Telephone Encounter (Signed)
LMTCB

## 2011-11-16 NOTE — Telephone Encounter (Signed)
Message copied by Awilda Bill on Mon Nov 16, 2011  3:23 PM ------      Message from: Rosalio Macadamia      Created: Mon Nov 16, 2011  3:11 PM       Ok to report. Labs are satisfactory. Will see how she does on the low dose Norvasc.

## 2011-11-16 NOTE — Telephone Encounter (Signed)
Pts sister returned my call and was given the lab results as she is the one that helps her sister manage her appts and medication. Vista Mink, CMA

## 2011-11-16 NOTE — Patient Instructions (Addendum)
We are going to check some labs today  We are going to add Hydralazine 25 mg two times a day - morning and evening   Stay on your current medicines  Monitor your blood pressure at home and bring me your readings to look at  We will see you back in 2 weeks  Call the [facility] office at [phone] if you have any questions, problems or concerns.

## 2011-11-17 ENCOUNTER — Ambulatory Visit: Payer: Medicare Other

## 2011-11-17 ENCOUNTER — Other Ambulatory Visit (HOSPITAL_BASED_OUTPATIENT_CLINIC_OR_DEPARTMENT_OTHER): Payer: Medicare Other | Admitting: Lab

## 2011-11-17 ENCOUNTER — Ambulatory Visit (HOSPITAL_BASED_OUTPATIENT_CLINIC_OR_DEPARTMENT_OTHER): Payer: Medicare Other | Admitting: Hematology & Oncology

## 2011-11-17 VITALS — BP 177/68 | HR 58 | Temp 97.5°F | Resp 18 | Ht 59.0 in | Wt 128.0 lb

## 2011-11-17 DIAGNOSIS — D649 Anemia, unspecified: Secondary | ICD-10-CM

## 2011-11-17 DIAGNOSIS — D510 Vitamin B12 deficiency anemia due to intrinsic factor deficiency: Secondary | ICD-10-CM

## 2011-11-17 DIAGNOSIS — D72829 Elevated white blood cell count, unspecified: Secondary | ICD-10-CM

## 2011-11-17 LAB — PROTEIN ELECTROPHORESIS, SERUM, WITH REFLEX
Albumin ELP: 58.3 % (ref 55.8–66.1)
Alpha-1-Globulin: 4.5 % (ref 2.9–4.9)
Alpha-2-Globulin: 13 % — ABNORMAL HIGH (ref 7.1–11.8)

## 2011-11-17 LAB — IRON AND TIBC: UIBC: 333 ug/dL (ref 125–400)

## 2011-11-17 LAB — CBC WITH DIFFERENTIAL (CANCER CENTER ONLY)
BASO#: 0.1 10*3/uL (ref 0.0–0.2)
BASO%: 0.5 % (ref 0.0–2.0)
EOS%: 1.7 % (ref 0.0–7.0)
HGB: 12.1 g/dL (ref 11.6–15.9)
LYMPH#: 2.6 10*3/uL (ref 0.9–3.3)
MCH: 25.6 pg — ABNORMAL LOW (ref 26.0–34.0)
MCHC: 33.5 g/dL (ref 32.0–36.0)
MONO%: 9.5 % (ref 0.0–13.0)
NEUT#: 8 10*3/uL — ABNORMAL HIGH (ref 1.5–6.5)
NEUT%: 66.4 % (ref 39.6–80.0)
RDW: 19.1 % — ABNORMAL HIGH (ref 11.1–15.7)

## 2011-11-17 LAB — RETICULOCYTES (CHCC)
ABS Retic: 62.7 10*3/uL (ref 19.0–186.0)
Retic Ct Pct: 1.3 % (ref 0.4–2.3)

## 2011-11-17 LAB — IFE INTERPRETATION

## 2011-11-17 LAB — VITAMIN B12: Vitamin B-12: 296 pg/mL (ref 211–911)

## 2011-11-17 LAB — FERRITIN: Ferritin: 31 ng/mL (ref 10–291)

## 2011-11-19 LAB — IGG, IGA, IGM: IgG (Immunoglobin G), Serum: 943 mg/dL (ref 690–1700)

## 2011-11-20 ENCOUNTER — Telehealth: Payer: Self-pay | Admitting: *Deleted

## 2011-11-20 ENCOUNTER — Other Ambulatory Visit: Payer: Self-pay | Admitting: *Deleted

## 2011-11-20 ENCOUNTER — Telehealth: Payer: Self-pay | Admitting: Hematology & Oncology

## 2011-11-20 DIAGNOSIS — D649 Anemia, unspecified: Secondary | ICD-10-CM

## 2011-11-20 NOTE — Telephone Encounter (Signed)
Message copied by Anselm Jungling on Fri Nov 20, 2011  1:50 PM ------      Message from: Josph Macho      Created: Fri Nov 20, 2011  7:37 AM       Please call her and let her that her iron is low. We do need to give her a dose of Feraheme at 1020 mg. Please set this up for one or 2 weeks. If she has a hard time understanding this, you may need to call one of her family members. Thanks so much. Pete.

## 2011-11-20 NOTE — Telephone Encounter (Signed)
Called patient to let her know that her iron levels were low per dr. Myna Hidalgo.  Call transferred to scheduler to make appt in 1-2 weeks for Feraheme 1020 mg. IV

## 2011-11-20 NOTE — Telephone Encounter (Signed)
Patient spoke with nurse, nurse transferred called to sch Iron appt.  Patient sch appt for 11/25/11 at 1:30.

## 2011-11-23 NOTE — Progress Notes (Signed)
CC:   Jerl Mina, MD  DIAGNOSES: 1. Anemia. 2. Leukocytosis.  HISTORY OF PRESENT ILLNESS:  [name] is a very nice 78 year old white female.  I took care of her husband probably about 10 years or so ago. I think he had bladder cancer.  She was seen at the [facility].  She has had some issues with anemia.  She feels tired all the time.  Going back to March of 2013, her CBC showed a white cell count of 10.6, hemoglobin 11.2, hematocrit 33.5, platelet count 280.  Her MCV was 81.  In July, hemoglobin was 10.8, hematocrit 33.1, MCV was down at 77. There was no obvious bleeding.  She had a urinalysis which was unremarkable.  She did have a low iron with a ferritin of 7.  Iron saturation was only 5%.  I think she was put on some oral iron.  In August her hemoglobin basically came up to normal.  Her iron studies still showed a low iron saturation of 8%.  Her ferritin was up to 40.  In September her hemoglobin was down to 11.4 and hematocrit 34.6.  She had an MCV of 78.  Platelet count was 249.  She still feels tired.  She does not have a lot of energy.  There is some occasional dizziness.  The patient still continues on quite a few medications.  I think she has done stool cards.  These have been negative.  She was kindly referred to the [facility] to help evaluate her blood issues.  PAST MEDICAL HISTORY: 1. Hypertension. 2. Diabetes. 3. Hypothyroidism. 4. Osteoporosis. 5. GERD. 6. Diverticulosis. 7. Depression. 8. COPD. 9. Fibromyalgia.  ALLERGIES: 1. Aricept. 2. Hydrocodone. 3. Metoprolol. 4. Nitrofurantoin. 5. Sulfa.  MEDICATIONS: 1. Norvasc 5 mg p.o. daily. 2. Aspirin 81 mg p.o. daily, 3. Nexium 40 mg p.o. daily. 4. Iron sulfate 50 mg daily. 5. Apresoline 25 mg p.o. b.i.d. 6. Indocin SR 75 mg p.o. q.h.s. 7. Synthroid 0.075 mg p.o. daily. 8. Metformin XR 750 mg p.o. b.i.d. 9. Metoprolol 100 mg p.o. daily. 10.Sertraline 50 mg p.o.  daily. 11.Spiriva 18 mcg inhaler as needed.  SOCIAL HISTORY:  Remarkable for I think past tobacco use.  There is no alcohol use.  FAMILY HISTORY:  Remarkable for no obvious anemia.  REVIEW OF SYSTEMS:  As stated in history of present illness.  PHYSICAL EXAMINATION:  General:  This is a petite white female in no obvious distress.  Vital signs:  Temperature of 97.5, pulse 58, respiratory rate 18, blood pressure 177/68.  Weight is 128.  Head and neck:  Shows a normocephalic, atraumatic skull.  There are no ocular or oral lesions.  She has no scleral icterus.  Conjunctivae are pink. There is no adenopathy in her neck.  Lungs:  Clear bilaterally.  There are no rales, wheeze or rhonchi.  Cardiac:  Regular rate and rhythm with normal S1, S2.  There are no murmurs, rubs or bruits.  Abdomen:  Soft with good bowel sounds.  There is no palpable abdominal mass.  There is no fluid wave.  She has some laparotomy scars that are well-healed. There is no palpable hepatosplenomegaly.  Back:  Shows some slight kyphosis.  She has no tenderness over the spine, ribs or hips. Extremities:  Show some trace edema in her legs.  She has good range of motion of her joints.  Skin:  No rashes, ecchymoses or petechiae.  LABORATORY STUDIES:  White cell count 12.1, hemoglobin 12.1, hematocrit  36.1, platelet count 298.  MCV 77.  Ferritin 31.  Iron saturation 13%. Total iron is 48.  BUN 18, creatinine 0.7.  Monoclonal spike is negative.  Vitamin B12 is 296.  Peripheral smear shows a slightly microcytic population of red blood cells.  There is some slight anisocytosis.  I see no nucleated red cells.  There are no teardrop cells.  She has no rouleaux formation.  I see no schistocytes.  There are no spherocytes.  I see no target cells. White cells are minimally increased in number.  White cells appear reactive.  There are no hypersegmented polys.  I see no immature myeloid or lymphoid forms.  There are no blasts.   Platelets are adequate in number and size.  IMPRESSION:  Ms. Cronk is a charming 78 year old white female who had past anemia.  This appears to be improving with iron.  She is on oral iron.  She does take proton pump inhibitors so this may prevent iron from really working too well.  Although she is really not anemic, her iron studies are borderline.  I wonder if a dose of Feraheme might get her to feel a little better.  She does not have any renal insufficiency so I do not think that erythropoietin deficiency is a problem.  Myelodysplasia is always a consideration but again, she is not anemic. I do not see anything on her peripheral smear that looked suspicious.  I do not see that she needs a bone marrow test.  I just think that that would really be overkill with respect to procedures.  I do not see anything that would suggest a B12 deficiency.  I do not see anything that would suggest leukemia or lymphoma.  Her monoclonal spike is negative, so I do not see that a plasma cell disorder is a problem.  We will see about getting her back for some iron.  We will see if this makes a difference for her.  I think this is about the only thing I can think of that could help her out.  I spent a good hour or more with Ms. Huot.  I have not seen her for a long time but she certainly did not look much different then when I had last seen her.    ______________________________ Josph Macho, M.D. PRE/MEDQ  D:  11/23/2011  T:  11/23/2011  Job:  [id number]

## 2011-11-23 NOTE — Progress Notes (Signed)
This office note has been dictated.

## 2011-11-25 ENCOUNTER — Ambulatory Visit (HOSPITAL_BASED_OUTPATIENT_CLINIC_OR_DEPARTMENT_OTHER): Payer: Medicare Other

## 2011-11-25 VITALS — BP 156/75 | HR 66 | Temp 97.1°F | Resp 16

## 2011-11-25 DIAGNOSIS — D649 Anemia, unspecified: Secondary | ICD-10-CM

## 2011-11-25 MED ORDER — SODIUM CHLORIDE 0.9 % IV SOLN
1020.0000 mg | Freq: Once | INTRAVENOUS | Status: AC
  Administered 2011-11-25: 1020 mg via INTRAVENOUS
  Filled 2011-11-25: qty 34

## 2011-11-25 MED ORDER — SODIUM CHLORIDE 0.9 % IV SOLN
INTRAVENOUS | Status: DC
  Administered 2011-11-25: 14:00:00 via INTRAVENOUS

## 2011-12-01 ENCOUNTER — Ambulatory Visit: Payer: Medicare Other | Admitting: Nurse Practitioner

## 2011-12-02 ENCOUNTER — Ambulatory Visit (INDEPENDENT_AMBULATORY_CARE_PROVIDER_SITE_OTHER): Payer: Medicare Other | Admitting: Nurse Practitioner

## 2011-12-02 ENCOUNTER — Encounter: Payer: Self-pay | Admitting: Nurse Practitioner

## 2011-12-02 ENCOUNTER — Other Ambulatory Visit: Payer: Self-pay

## 2011-12-02 VITALS — BP 136/64 | HR 67 | Ht 59.0 in | Wt 129.8 lb

## 2011-12-02 DIAGNOSIS — I10 Essential (primary) hypertension: Secondary | ICD-10-CM

## 2011-12-02 NOTE — Patient Instructions (Signed)
Stay on your current medicines  We will see you back in January  Call the [facility] office at [phone] if you have any questions, problems or concerns.

## 2011-12-02 NOTE — Progress Notes (Signed)
[name] Date of Birth: June 27, 1924 Medical Record #[id number]  History of Present Illness: [name] is seen back today for a 2 week check. She is seen for Dr. Swaziland. She has a history of HTN, DM, hypothyroidism, vertigo, anemia, OA, GERD, depresion, COPD, prior DVT, DJD and recurrent falls. She has had a negative event monitor earlier this year.   I saw her 2 weeks ago with a multitude of issues. She continues to fall. Was to see neurology. Is anemic and saw Dr. Myna Hidalgo. He was considering iron therapy. She has had issues with her blood pressure and I added Hydralazine at her last visit. Her POA was looking into assisted living options.   She comes in today. She is here with her POA. She seems to be doing ok. Blood pressure has come down. She remains tired but this is a chronic issue. No chest pain. Not short of breath. Her readings from home show that her BP is improving. No recent falls but remains unsteady. Still considering AL options.   Current Outpatient Prescriptions on File Prior to Visit  Medication Sig Dispense Refill  . amLODipine (NORVASC) 10 MG tablet Take 5 mg by mouth daily.        Marland Kitchen aspirin 81 MG tablet Take 81 mg by mouth daily.        Marland Kitchen esomeprazole (NEXIUM) 40 MG capsule Take 40 mg by mouth daily before breakfast.        . hydrALAZINE (APRESOLINE) 25 MG tablet Take 1 tablet (25 mg total) by mouth 2 (two) times daily.  60 tablet  3  . Ibuprofen (ADVIL) 200 MG CAPS Take 200 mg by mouth 2 (two) times daily.      . indomethacin (INDOCIN SR) 75 MG CR capsule Take 75 mg by mouth at bedtime.        Marland Kitchen levothyroxine (SYNTHROID, LEVOTHROID) 75 MCG tablet Take 75 mcg by mouth daily.        . metFORMIN (GLUCOPHAGE XR) 750 MG 24 hr tablet Take 750 mg by mouth 2 (two) times daily.      . metoprolol succinate (TOPROL XL) 100 MG 24 hr tablet Take 1 tablet (100 mg total) by mouth daily.  30 tablet  9  . Multiple Vitamin (MULTIVITAMIN PO) Take 1 tablet by mouth daily.        . NON  FORMULARY Take 1 tablet by mouth daily. Stool softner      . Polyethyl Glycol-Propyl Glycol (SYSTANE) 0.4-0.3 % SOLN Apply to eye as needed.      . ranitidine (ZANTAC) 75 MG tablet Take 75 mg by mouth at bedtime.        . sertraline (ZOLOFT) 50 MG tablet Take 50 mg by mouth daily.        Marland Kitchen tiotropium (SPIRIVA) 18 MCG inhalation capsule Place 18 mcg into inhaler and inhale as needed.        . ferrous fumarate (FERRO-SEQUELS) 50 MG CR tablet Take 50 mg by mouth every morning.        Allergies  Allergen Reactions  . Aricept (Donepezil Hydrochloride)     REACTION: delusions  . Hydrocodone-Acetaminophen     REACTION: U/K  . Metoprolol     REACTION: dizzy  . Nitrofurantoin   . Sulfonamide Derivatives     REACTION: unspecified    Past Medical History  Diagnosis Date  . Hypertension   . Diabetes mellitus   . Hypothyroidism   . Vertigo   . Anemia   .  Osteoarthritis   . GERD (gastroesophageal reflux disease)   . Diverticulosis   . Depression   . H/O: hysterectomy     at age 55  . COPD (chronic obstructive pulmonary disease)   . History of DVT (deep vein thrombosis)   . Cancer     basal cell  . Anxiety   . Dyspnea   . Enlarged heart   . Fibromyalgia   . Degenerative joint disease   . Degenerative disk disease   . Tremor   . Falls     Past Surgical History  Procedure Date  . Cholecystectomy 1944  . Appendectomy 1952  . Ovarian cyst removal 1982    secondary ventral hematoma  . Bladder tack 1997  . Cataract extraction, bilateral   . Kyphosis surgery   . Total abdominal hysterectomy   . Salpingoophorectomy     unilateral salpingo-oophorectomy    History  Smoking status  . Never Smoker   Smokeless tobacco  . Not on file    History  Alcohol Use No    Family History  Problem Relation Age of Onset  . Stroke Mother   . Cancer Father     Pancreatic Cancer  . Kidney disease Sister   . Heart disease Brother   . Stroke Brother   . Heart disease Brother      Review of Systems: The review of systems is per the HPI.  All other systems were reviewed and are negative.  Physical Exam: BP 136/64  Pulse 67  Ht 4\' 11"  (1.499 m)  Wt 129 lb 12.8 oz (58.877 kg)  BMI 26.22 kg/m2 Patient is very pleasant and in no acute distress. Skin is warm and dry. Color is normal.  HEENT is unremarkable. Normocephalic/atraumatic. PERRL. Sclera are nonicteric. Neck is supple. No masses. No JVD. Lungs are clear. Cardiac exam shows a regular rate and rhythm. Abdomen is soft. Extremities are without edema. Gait and ROM are intact. No gross neurologic deficits noted.   LABORATORY DATA: N/A  Lab Results  Component Value Date   WBC 12.1* 11/17/2011   HGB 12.1 11/17/2011   HCT 36.1 11/17/2011   PLT 298 11/17/2011   GLUCOSE 95 11/16/2011   CHOL 176 09/29/2006   TRIG 120 09/29/2006   HDL 46.8 09/29/2006   LDLDIRECT 121.6 01/07/2009   LDLCALC 105* 09/29/2006   ALT 18 04/07/2011   AST 20 04/07/2011   NA 131* 11/16/2011   K 3.8 11/16/2011   CL 94* 11/16/2011   CREATININE 0.7 11/16/2011   BUN 18 11/16/2011   CO2 28 11/16/2011   TSH 0.91 04/07/2011   HGBA1C 7.7* 06/10/2009   MICROALBUR 15.4* 10/10/2008     Assessment / Plan:  1. HTN - blood pressure looks better with her current regimen. Will see her back at her regular visit in January.  2. Falls - using her walker.  She seems to be holding her own. Would still encourage a move to AL.   Patient is agreeable to this plan and will call if any problems develop in the interim.

## 2012-01-06 ENCOUNTER — Ambulatory Visit (HOSPITAL_BASED_OUTPATIENT_CLINIC_OR_DEPARTMENT_OTHER): Payer: Medicare Other | Admitting: Medical

## 2012-01-06 ENCOUNTER — Other Ambulatory Visit (HOSPITAL_BASED_OUTPATIENT_CLINIC_OR_DEPARTMENT_OTHER): Payer: Medicare Other | Admitting: Lab

## 2012-01-06 VITALS — BP 153/53 | HR 55 | Temp 97.6°F | Resp 16 | Ht 59.0 in | Wt 131.0 lb

## 2012-01-06 DIAGNOSIS — D72829 Elevated white blood cell count, unspecified: Secondary | ICD-10-CM

## 2012-01-06 DIAGNOSIS — I10 Essential (primary) hypertension: Secondary | ICD-10-CM

## 2012-01-06 DIAGNOSIS — D509 Iron deficiency anemia, unspecified: Secondary | ICD-10-CM

## 2012-01-06 LAB — CBC WITH DIFFERENTIAL (CANCER CENTER ONLY)
BASO#: 0 10*3/uL (ref 0.0–0.2)
BASO%: 0.4 % (ref 0.0–2.0)
EOS%: 2.6 % (ref 0.0–7.0)
Eosinophils Absolute: 0.3 10*3/uL (ref 0.0–0.5)
HCT: 34.5 % — ABNORMAL LOW (ref 34.8–46.6)
HGB: 11.8 g/dL (ref 11.6–15.9)
LYMPH#: 3 10*3/uL (ref 0.9–3.3)
LYMPH%: 31.5 % (ref 14.0–48.0)
MCH: 28.1 pg (ref 26.0–34.0)
MCHC: 34.2 g/dL (ref 32.0–36.0)
MCV: 82 fL (ref 81–101)
MONO#: 0.9 10*3/uL (ref 0.1–0.9)
MONO%: 9.2 % (ref 0.0–13.0)
NEUT#: 5.4 10*3/uL (ref 1.5–6.5)
NEUT%: 56.3 % (ref 39.6–80.0)
Platelets: 262 10*3/uL (ref 145–400)
RBC: 4.2 10*6/uL (ref 3.70–5.32)
RDW: 17.7 % — ABNORMAL HIGH (ref 11.1–15.7)
WBC: 9.5 10*3/uL (ref 3.9–10.0)

## 2012-01-06 LAB — CHCC SATELLITE - SMEAR

## 2012-01-06 NOTE — Progress Notes (Signed)
Diagnoses: #1 anemia. #2 leukocytosis.  Current therapy: IV iron as needed.  She last received IV iron on 11/25/2011.  Interim history: [name] presents today for an office followup visit.  We recently saw her back in September.  As a new patient for iron deficiency anemia.  We checked her iron studies.  Her iron is 48, with 13% saturation.  Her ferritin was 31.  She also presented with some leukocytosis, with a white count of 12.1.  She received IV iron on 11/25/2011.  She reports, that she noticed a little bit more energy.  For about 2 or 3 days and seemed to taper off.  She also reports, that she's been having problems with uncontrolled blood pressure.  She sees Dr. Swaziland as her cardiologist.  Apparently, she was placed on 3 different types of blood pressure medication to help control her blood pressure.  She is on a slew of medications and I really do feel that this contributes to her fatigue.  Her hemoglobin is 11.8.  Today, hematocrit 34.5, MCV is 82.  Her white count is actually within normal range today at 9.5.  Of note, she is on a proton pump inhibitor, which could prevent iron from working too well.  She reports, that she has a decent appetite.  She denies any nausea, vomiting, diarrhea, or constipation.  She denies any fevers, chills, or night sweats.  She denies any cough, chest pain, or shortness of breath.  She denies any headaches, visual changes, or rashes.  She denies passing any obvious, or abnormal blood.  She denies any lower leg swelling.    Review of Systems: Constitutional:Negative for malaise/fatigue, fever, chills, weight loss, diaphoresis, activity change, appetite change, and unexpected weight change.  HEENT: Negative for double vision, blurred vision, visual loss, ear pain, tinnitus, congestion, rhinorrhea, epistaxis sore throat or sinus disease, oral pain/lesion, tongue soreness Respiratory: Negative for cough, chest tightness, shortness of breath, wheezing and stridor.    Cardiovascular: Negative for chest pain, palpitations, leg swelling, orthopnea, PND, DOE or claudication Gastrointestinal: Negative for nausea, vomiting, abdominal pain, diarrhea, constipation, blood in stool, melena, hematochezia, abdominal distention, anal bleeding, rectal pain, anorexia and hematemesis.  Genitourinary: Negative for dysuria, frequency, hematuria,  Musculoskeletal: Negative for myalgias, back pain, joint swelling, arthralgias and gait problem.  Skin: Negative for rash, color change, pallor and wound.  Neurological:. Negative for dizziness/light-headedness, tremors, seizures, syncope, facial asymmetry, speech difficulty, weakness, numbness, headaches and paresthesias.  Hematological: Negative for adenopathy. Does not bruise/bleed easily.  Psychiatric/Behavioral:  Negative for depression, no loss of interest in normal activity or change in sleep pattern.   Physical Exam: This is a pleasant, petite, 78 year old, white female, in no obvious distress Vitals: Temperature 97.6 degrees, pulse 55, respirations 16, blood pressure 153/53, weight 131 pounds HEENT reveals a normocephalic, atraumatic skull, no scleral icterus, no oral lesions  Neck is supple without any cervical or supraclavicular adenopathy.  Lungs are clear to auscultation bilaterally. There are no wheezes, rales or rhonci Cardiac is regular rate and rhythm with a normal S1 and S2. There are no murmurs, rubs, or bruits.  Abdomen is soft with good bowel sounds, there is no palpable mass. There is no palpable hepatosplenomegaly. There is no palpable fluid wave. She does have some laparotomy scars that are well healed.  Musculoskeletal no tenderness of the spine, ribs, or hips.  Extremities there are no clubbing, cyanosis, or edema.  Skin no petechia, purpura or ecchymosis Neurologic is nonfocal.  Laboratory Data: White count  9.5, hemoglobin 76.8, hematocrit 34.5.  Platelets 262,000  Current Outpatient Prescriptions on  File Prior to Visit  Medication Sig Dispense Refill  . amLODipine (NORVASC) 10 MG tablet Take 5 mg by mouth daily.        Marland Kitchen aspirin 81 MG tablet Take 81 mg by mouth daily.        Marland Kitchen esomeprazole (NEXIUM) 40 MG capsule Take 40 mg by mouth daily before breakfast.        . ferrous fumarate (FERRO-SEQUELS) 50 MG CR tablet Take 50 mg by mouth every morning.      . hydrALAZINE (APRESOLINE) 25 MG tablet Take 1 tablet (25 mg total) by mouth 2 (two) times daily.  60 tablet  3  . Ibuprofen (ADVIL) 200 MG CAPS Take 200 mg by mouth 2 (two) times daily.      . indomethacin (INDOCIN SR) 75 MG CR capsule Take 75 mg by mouth at bedtime.        Marland Kitchen levothyroxine (SYNTHROID, LEVOTHROID) 75 MCG tablet Take 75 mcg by mouth daily.        . metFORMIN (GLUCOPHAGE XR) 750 MG 24 hr tablet Take 750 mg by mouth 2 (two) times daily.      . metoprolol succinate (TOPROL XL) 100 MG 24 hr tablet Take 1 tablet (100 mg total) by mouth daily.  30 tablet  9  . Multiple Vitamin (MULTIVITAMIN PO) Take 1 tablet by mouth daily.        . NON FORMULARY Take 1 tablet by mouth daily. Stool softner      . NON FORMULARY Faraheme infusion      . Polyethyl Glycol-Propyl Glycol (SYSTANE) 0.4-0.3 % SOLN Apply to eye as needed.      . ranitidine (ZANTAC) 75 MG tablet Take 75 mg by mouth at bedtime.        . sertraline (ZOLOFT) 50 MG tablet Take 50 mg by mouth daily.        Marland Kitchen tiotropium (SPIRIVA) 18 MCG inhalation capsule Place 18 mcg into inhaler and inhale as needed.         Assessment/Plan: This is a pleasant, 78 year old, white female, with the following issues:  #1.  Iron deficiency anemia.  She did receive IV iron.  On 11/25/2011.  Again, we did do a thorough workup, with her.  Her iron studies were borderline.  I did not see anything that would suggest a B12 deficiency.  At did not see that she has any type of renal insufficiency.  There is not anything that would suggest any leukemia or lymphoma.  Her myocardial spike was negative, so there  is no indication of a plasma cell disorder.  We will continue to monitor her blood work and iron panel.  #2 hypertension.  She is being monitored by Dr. Elvis Coil office.  #3 leukocytosis.  Her white count is completely normal today.  Her polypharmacy could contribute to leukocytosis.  #4.  Followup.  We will see [name].  Back in about 3 months, but before then should there be questions or concerns.

## 2012-03-07 ENCOUNTER — Telehealth: Payer: Self-pay

## 2012-03-07 MED ORDER — METOPROLOL SUCCINATE ER 100 MG PO TB24: 100.0000 mg | ORAL_TABLET | Freq: Every day | ORAL | Status: DC

## 2012-03-07 NOTE — Telephone Encounter (Signed)
Received call from [facility] requesting refill on toprol 100 mg.Advised ok to refill toprol 100 mg # 30 refills x 6.

## 2012-03-30 ENCOUNTER — Ambulatory Visit: Payer: Medicare Other | Admitting: Cardiology

## 2012-04-06 ENCOUNTER — Ambulatory Visit: Payer: Medicare Other | Admitting: Hematology & Oncology

## 2012-04-06 ENCOUNTER — Other Ambulatory Visit: Payer: Medicare Other | Admitting: Lab

## 2012-04-06 ENCOUNTER — Telehealth: Payer: Self-pay | Admitting: Hematology & Oncology

## 2012-04-06 NOTE — Telephone Encounter (Signed)
Patient called and cx 04/06/12 and resch for 04/19/12.  Darl Pikes was notified of cx apt

## 2012-04-13 ENCOUNTER — Telehealth: Payer: Self-pay | Admitting: Hematology & Oncology

## 2012-04-13 NOTE — Telephone Encounter (Signed)
Patient called and cx 04/14/12 apt and stated she will call back to resch

## 2012-04-14 ENCOUNTER — Ambulatory Visit: Payer: Medicare Other | Admitting: Hematology & Oncology

## 2012-04-14 ENCOUNTER — Other Ambulatory Visit: Payer: Medicare Other | Admitting: Lab

## 2012-04-19 ENCOUNTER — Other Ambulatory Visit: Payer: Medicare Other | Admitting: Lab

## 2012-04-19 ENCOUNTER — Ambulatory Visit: Payer: Medicare Other | Admitting: Hematology & Oncology

## 2012-04-19 ENCOUNTER — Emergency Department: Payer: Self-pay | Admitting: Emergency Medicine

## 2012-04-21 ENCOUNTER — Other Ambulatory Visit: Payer: Medicare Other | Admitting: Lab

## 2012-04-21 ENCOUNTER — Ambulatory Visit: Payer: Medicare Other | Admitting: Hematology & Oncology

## 2012-04-22 ENCOUNTER — Other Ambulatory Visit (HOSPITAL_BASED_OUTPATIENT_CLINIC_OR_DEPARTMENT_OTHER): Payer: Medicare Other | Admitting: Lab

## 2012-04-22 ENCOUNTER — Ambulatory Visit (HOSPITAL_BASED_OUTPATIENT_CLINIC_OR_DEPARTMENT_OTHER): Payer: Medicare Other | Admitting: Medical

## 2012-04-22 VITALS — BP 147/52 | HR 63 | Temp 97.5°F | Resp 16 | Ht 59.0 in | Wt 136.0 lb

## 2012-04-22 DIAGNOSIS — D72829 Elevated white blood cell count, unspecified: Secondary | ICD-10-CM

## 2012-04-22 DIAGNOSIS — D509 Iron deficiency anemia, unspecified: Secondary | ICD-10-CM

## 2012-04-22 LAB — CBC WITH DIFFERENTIAL (CANCER CENTER ONLY)
BASO%: 0.3 % (ref 0.0–2.0)
EOS%: 2.9 % (ref 0.0–7.0)
HCT: 37.9 % (ref 34.8–46.6)
LYMPH%: 22.7 % (ref 14.0–48.0)
MCHC: 33.8 g/dL (ref 32.0–36.0)
MCV: 87 fL (ref 81–101)
NEUT%: 65.2 % (ref 39.6–80.0)
RDW: 13.3 % (ref 11.1–15.7)
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
## 2012-05-16 ENCOUNTER — Ambulatory Visit: Payer: Medicare Other | Admitting: Cardiology

## 2012-05-23 ENCOUNTER — Encounter: Payer: Self-pay | Admitting: Nurse Practitioner

## 2012-05-23 ENCOUNTER — Ambulatory Visit (INDEPENDENT_AMBULATORY_CARE_PROVIDER_SITE_OTHER): Payer: Medicare Other | Admitting: Nurse Practitioner

## 2012-05-23 VITALS — BP 130/68 | HR 56 | Ht <= 58 in | Wt 133.4 lb

## 2012-05-23 DIAGNOSIS — I10 Essential (primary) hypertension: Secondary | ICD-10-CM

## 2012-05-23 NOTE — Patient Instructions (Addendum)
Stay on your current medicines  Keep using your walker  See Dr. Swaziland in 6 months  Call the [facility] office at [phone] if you have any questions, problems or concerns.
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
## 2012-05-31 ENCOUNTER — Other Ambulatory Visit: Payer: Self-pay | Admitting: *Deleted

## 2012-05-31 MED ORDER — HYDRALAZINE HCL 25 MG PO TABS: 25.0000 mg | ORAL_TABLET | Freq: Every morning | ORAL | Status: DC

## 2012-07-07 ENCOUNTER — Telehealth: Payer: Self-pay | Admitting: Internal Medicine

## 2012-07-07 NOTE — Telephone Encounter (Signed)
i did not see where the pt had been seen by CY in epic.  Looked in centricity and last ov with CY was 07/02/2009.   Pt went by the pharmacy and requested that they  Call and see if CY would refill her spiriva.  CY please advise. Thanks  Allergies  Allergen Reactions  . Aricept (Donepezil Hydrochloride)     REACTION: delusions  . Hydrocodone-Acetaminophen     REACTION: U/K  . Metoprolol     REACTION: dizzy  . Nitrofurantoin   . Sulfonamide Derivatives     REACTION: unspecified

## 2012-07-08 MED ORDER — TIOTROPIUM BROMIDE MONOHYDRATE 18 MCG IN CAPS: 18.0000 ug | ORAL_CAPSULE | Freq: Every day | RESPIRATORY_TRACT | Status: DC

## 2012-07-08 NOTE — Telephone Encounter (Signed)
Ok to Rx Spiriva with one refill, 1 daily. Help her make a return appointment to re-establish.

## 2012-07-08 NOTE — Telephone Encounter (Signed)
Spoke with patient, patient aware spiriva has been called in Patient can only be seen on Wednesdays or Fridays Patient scheduled for Fri July 11 @ 145pm Advised to bring medications and arrive 15 min early Patient aware spiriva has been called into her pharamcy Nothing further needed at this time

## 2012-07-15 ENCOUNTER — Other Ambulatory Visit (HOSPITAL_BASED_OUTPATIENT_CLINIC_OR_DEPARTMENT_OTHER): Payer: Medicare Other | Admitting: Lab

## 2012-07-15 ENCOUNTER — Encounter: Payer: Self-pay | Admitting: Hematology & Oncology

## 2012-07-15 ENCOUNTER — Ambulatory Visit (HOSPITAL_BASED_OUTPATIENT_CLINIC_OR_DEPARTMENT_OTHER): Payer: Medicare Other | Admitting: Hematology & Oncology

## 2012-07-15 VITALS — BP 116/81 | HR 62 | Temp 98.0°F | Resp 16 | Ht 59.0 in | Wt 137.0 lb

## 2012-07-15 DIAGNOSIS — D72829 Elevated white blood cell count, unspecified: Secondary | ICD-10-CM

## 2012-07-15 DIAGNOSIS — D509 Iron deficiency anemia, unspecified: Secondary | ICD-10-CM

## 2012-07-15 HISTORY — DX: Iron deficiency anemia, unspecified: D50.9

## 2012-07-15 LAB — FERRITIN: Ferritin: 207 ng/mL (ref 10–291)

## 2012-07-15 LAB — CBC WITH DIFFERENTIAL (CANCER CENTER ONLY)
BASO%: 0.6 % (ref 0.0–2.0)
EOS%: 5.5 % (ref 0.0–7.0)
HCT: 35.6 % (ref 34.8–46.6)
LYMPH#: 3.1 10*3/uL (ref 0.9–3.3)
LYMPH%: 31 % (ref 14.0–48.0)
MCHC: 32.9 g/dL (ref 32.0–36.0)
MCV: 88 fL (ref 81–101)
MONO#: 0.8 10*3/uL (ref 0.1–0.9)
NEUT%: 54.9 % (ref 39.6–80.0)
Platelets: 252 10*3/uL (ref 145–400)
RDW: 13.9 % (ref 11.1–15.7)
WBC: 10 10*3/uL (ref 3.9–10.0)

## 2012-07-15 LAB — IRON AND TIBC: TIBC: 303 ug/dL (ref 250–470)

## 2012-07-15 NOTE — Progress Notes (Signed)
This office note has been dictated.

## 2012-07-16 NOTE — Progress Notes (Signed)
CC:   Jerl Mina, MD  DIAGNOSES: 1. Leukocytosis, reactive. 2. Intermittent iron-deficiency anemia.  CURRENT THERAPY:  IV iron as indicated.  INTERIM HISTORY:  Ms. Hickling comes in for followup.  She last saw French Ana back in February.  Ms. Garin has been doing okay.  She does feel tired.  I think a lot of it is from the medication that she takes.  She has had some skin cancers removed.  She last got iron back in September.  Back in February, her ferritin was 236 with an iron saturation of only 8%.  Total iron was 22.  I suspect that she is going to be iron deficient and that we do need to give her some iron.  She does have the leukocytosis.  This is reactive from my point of view.  She continues on a bunch of medications.  I am not sure which ones can be discontinued for her.  She is worried about ecchymoses that she develops.  I think this is because of her taking aspirin and nonsteroidals.  PHYSICAL EXAMINATION:  General:  This is a petite, elderly white female in no obvious distress.  Vital signs:  Temperature of 98, pulse 62, respiratory rate 16, blood pressure 116/81.  Weight is 137.  Head and neck:  Normocephalic, atraumatic skull.  There are no ocular or oral lesions.  There are no palpable cervical or supraclavicular lymph nodes. Lungs:  Clear bilaterally.  Cardiac:  Regular rate and rhythm with a normal S1 and S2.  There are no murmurs, rubs or bruits.  Abdomen: Soft, slightly distended.  She has no fluid wave.  There is no palpable abdominal mass.  There is no palpable hepatosplenomegaly.  Extremities: Osteoarthritic changes in her joints.  Skin:  Does show some ecchymoses.  LABORATORY STUDIES:  White cell count is 10, hemoglobin 11.7, hematocrit 35.6, platelet count 252,000.  MCV is 88.  IMPRESSION:  Ms. Slee is an 79 year old white female with leukocytosis. This has resolved.  Again, this tends to "come and go."  I believe this is totally reactive.  We will  see what the iron studies show.  Again, her iron saturation was only 8 back in February.  Her ferritin was okay, which could be an acute- phase reactant.  I think that we can probably get her back in 6 months.  We will call her to her to let her know if she does need any iron.    ______________________________ Josph Macho, M.D. PRE/MEDQ  D:  07/15/2012  T:  07/16/2012  Job:  [id number]

## 2012-07-20 ENCOUNTER — Ambulatory Visit: Payer: Self-pay | Admitting: Family Medicine

## 2012-07-21 ENCOUNTER — Other Ambulatory Visit: Payer: Medicare Other | Admitting: Lab

## 2012-07-21 ENCOUNTER — Ambulatory Visit: Payer: Medicare Other | Admitting: Hematology & Oncology

## 2012-07-27 ENCOUNTER — Ambulatory Visit (HOSPITAL_BASED_OUTPATIENT_CLINIC_OR_DEPARTMENT_OTHER): Payer: Medicare Other

## 2012-07-27 VITALS — BP 145/67 | HR 59 | Temp 98.4°F | Resp 18

## 2012-07-27 DIAGNOSIS — D649 Anemia, unspecified: Secondary | ICD-10-CM

## 2012-07-27 DIAGNOSIS — D509 Iron deficiency anemia, unspecified: Secondary | ICD-10-CM

## 2012-07-27 MED ORDER — SODIUM CHLORIDE 0.9 % IV SOLN
1020.0000 mg | Freq: Once | INTRAVENOUS | Status: AC
  Administered 2012-07-27: 1020 mg via INTRAVENOUS
  Filled 2012-07-27: qty 34

## 2012-07-27 MED ORDER — SODIUM CHLORIDE 0.9 % IV SOLN
Freq: Once | INTRAVENOUS | Status: AC
  Administered 2012-07-27: 13:00:00 via INTRAVENOUS

## 2012-07-27 NOTE — Patient Instructions (Addendum)
Ferumoxytol injection What is this medicine? FERUMOXYTOL is an iron complex. Iron is used to make healthy red blood cells, which carry oxygen and nutrients throughout the body. This medicine is used to treat iron deficiency anemia in people with chronic kidney disease. This medicine may be used for other purposes; ask your health care provider or pharmacist if you have questions. What should I tell my health care provider before I take this medicine? They need to know if you have any of these conditions: -anemia not caused by low iron levels -high levels of iron in the blood -magnetic resonance imaging (MRI) test scheduled -an unusual or allergic reaction to iron, other medicines, foods, dyes, or preservatives -pregnant or trying to get pregnant -breast-feeding How should I use this medicine? This medicine is for infusion into a vein. It is given by a health care professional in a hospital or clinic setting. Talk to your pediatrician regarding the use of this medicine in children. Special care may be needed. Overdosage: If you think you've taken too much of this medicine contact a poison control center or emergency room at once. Overdosage: If you think you have taken too much of this medicine contact a poison control center or emergency room at once. NOTE: This medicine is only for you. Do not share this medicine with others. What if I miss a dose? It is important not to miss your dose. Call your doctor or health care professional if you are unable to keep an appointment. What may interact with this medicine? This medicine may interact with the following medications: -other iron products This list may not describe all possible interactions. Give your health care provider a list of all the medicines, herbs, non-prescription drugs, or dietary supplements you use. Also tell them if you smoke, drink alcohol, or use illegal drugs. Some items may interact with your medicine. What should I watch  for while using this medicine? Visit your doctor or healthcare professional regularly. Tell your doctor or healthcare professional if your symptoms do not start to get better or if they get worse. You may need blood work done while you are taking this medicine. You may need to follow a special diet. Talk to your doctor. Foods that contain iron include: whole grains/cereals, dried fruits, beans, or peas, leafy green vegetables, and organ meats (liver, kidney). What side effects may I notice from receiving this medicine? Side effects that you should report to your doctor or health care professional as soon as possible: -allergic reactions like skin rash, itching or hives, swelling of the face, lips, or tongue -breathing problems -changes in blood pressure -feeling faint or lightheaded, falls -fever or chills -flushing, sweating, or hot feelings -swelling of the ankles or feet Side effects that usually do not require medical attention (Report these to your doctor or health care professional if they continue or are bothersome.): -diarrhea -headache -nausea, vomiting -stomach pain This list may not describe all possible side effects. Call your doctor for medical advice about side effects. You may report side effects to FDA at 1-800-FDA-1088. Where should I keep my medicine? This drug is given in a hospital or clinic and will not be stored at home. NOTE: This sheet is a summary. It may not cover all possible information. If you have questions about this medicine, talk to your doctor, pharmacist, or health care provider.  2012, Elsevier/Gold Standard. (11/09/2007 9:48:25 PM) 

## 2012-09-09 ENCOUNTER — Encounter: Payer: Self-pay | Admitting: Internal Medicine

## 2012-09-09 ENCOUNTER — Ambulatory Visit (INDEPENDENT_AMBULATORY_CARE_PROVIDER_SITE_OTHER)
Admission: RE | Admit: 2012-09-09 | Discharge: 2012-09-09 | Disposition: A | Discharge status: Home / Self Care | Payer: Medicare Other | Source: Ambulatory Visit | Attending: Internal Medicine | Admitting: Internal Medicine

## 2012-09-09 ENCOUNTER — Ambulatory Visit (INDEPENDENT_AMBULATORY_CARE_PROVIDER_SITE_OTHER): Payer: Medicare Other | Admitting: Internal Medicine

## 2012-09-09 VITALS — BP 116/64 | HR 60 | Ht <= 58 in | Wt 136.8 lb

## 2012-09-09 DIAGNOSIS — R0602 Shortness of breath: Secondary | ICD-10-CM

## 2012-09-09 DIAGNOSIS — J449 Chronic obstructive pulmonary disease, unspecified: Secondary | ICD-10-CM

## 2012-09-09 NOTE — Progress Notes (Signed)
09/09/12- 79 yoF never smoker  self-referral to re-establish (last ov 06/2009).   here with a caregiver "Debbie" Reports a hacking cough, mostly at night.  using Spiriva daily She had pneumonia several times, starting at age 79. Husband smoked, representing secondhand smoke exposure. She describes shortness of breath especially with exertion on longer walks, dry cough. Denies exertional chest pain but does snore. No wheezing. Her breathing does not wake her. She has history of cardiomegaly and hypertension, diabetes. Latex allergy. Some history of seasonal allergic rhinitis and asthma. Widowed, retired from school system. A brother with emphysema. She has been using Spiriva. CXR 07/02/09 IMPRESSION:  Stable chronic lung disease. No acute cardiopulmonary process.  Provider: Darrelyn Hillock  Prior to Admission medications   Medication Sig Start Date End Date Taking? Authorizing Provider  amLODipine (NORVASC) 10 MG tablet Take 5 mg by mouth daily.     Yes Historical Provider, MD  aspirin 81 MG tablet Take 81 mg by mouth daily.     Yes Historical Provider, MD  clorazepate (TRANXENE) 3.75 MG tablet Take 3.75 mg by mouth at bedtime.  12/23/11  Yes Historical Provider, MD  Docusate Sodium (COLACE PO) Take by mouth every morning.   Yes Historical Provider, MD  esomeprazole (NEXIUM) 40 MG capsule Take 40 mg by mouth daily before breakfast.     Yes Historical Provider, MD  hydrALAZINE (APRESOLINE) 25 MG tablet Take 1 tablet (25 mg total) by mouth every morning. 05/31/12  Yes Rosalio Macadamia, NP  Ibuprofen (ADVIL) 200 MG CAPS Take 200 mg by mouth 2 (two) times daily.   Yes Historical Provider, MD  indomethacin (INDOCIN SR) 75 MG CR capsule Take 75 mg by mouth at bedtime.     Yes Historical Provider, MD  levothyroxine (SYNTHROID, LEVOTHROID) 75 MCG tablet Take 75 mcg by mouth daily.     Yes Historical Provider, MD  lidocaine (LIDODERM) 5 % Place 1 patch onto the skin daily.  09/05/12  Yes Historical Provider, MD   metFORMIN (GLUCOPHAGE XR) 750 MG 24 hr tablet Take 750 mg by mouth 2 (two) times daily.   Yes Historical Provider, MD  metoprolol succinate (TOPROL XL) 100 MG 24 hr tablet Take 1 tablet (100 mg total) by mouth daily. 03/07/12 03/07/13 Yes Peter M Sw[place], MD  Multiple Vitamin (MULTIVITAMIN PO) Take 1 tablet by mouth daily.     Yes Historical Provider, MD  Polyethyl Glycol-Propyl Glycol (SYSTANE) 0.4-0.3 % SOLN Apply to eye as needed.   Yes Historical Provider, MD  polyethylene glycol powder (GLYCOLAX/MIRALAX) powder Take 17 g by mouth daily.  06/29/12  Yes Historical Provider, MD  ranitidine (ZANTAC) 75 MG tablet Take 75 mg by mouth at bedtime.     Yes Historical Provider, MD  sertraline (ZOLOFT) 50 MG tablet Take 50 mg by mouth daily.     Yes Historical Provider, MD  tiotropium (SPIRIVA) 18 MCG inhalation capsule Place 1 capsule (18 mcg total) into inhaler and inhale daily. 07/08/12  Yes Waymon Budge, MD   Past Medical History  Diagnosis Date  . Hypertension   . Diabetes mellitus   . Hypothyroidism   . Vertigo   . Anemia   . Osteoarthritis   . GERD (gastroesophageal reflux disease)   . Diverticulosis   . Depression   . H/O: hysterectomy     at age 14  . COPD (chronic obstructive pulmonary disease)   . History of DVT (deep vein thrombosis)   . Cancer     basal cell  .  Anxiety   . Dyspnea   . Enlarged heart   . Fibromyalgia   . Degenerative joint disease   . Degenerative disk disease   . Tremor   . Falls   . Iron deficiency anemia, unspecified 07/15/2012   Past Surgical History  Procedure Laterality Date  . Cholecystectomy  1944  . Appendectomy  1952  . Ovarian cyst removal  1982    secondary ventral hematoma  . Bladder tack  1997  . Cataract extraction, bilateral    . Kyphosis surgery    . Total abdominal hysterectomy    . Salpingoophorectomy      unilateral salpingo-oophorectomy   Family History  Problem Relation Age of Onset  . Stroke Mother   . Cancer Father      Pancreatic Cancer  . Kidney disease Sister   . Heart disease Brother   . Stroke Brother   . Heart disease Brother    History   Social History  . Marital Status: Widowed    Spouse Name: N/A    Number of Children: N/A  . Years of Education: N/A   Occupational History  . Not on file.   Social History Main Topics  . Smoking status: Never Smoker   . Smokeless tobacco: Not on file  . Alcohol Use: No  . Drug Use: No  . Sexually Active: No   Other Topics Concern  . Not on file   Social History Narrative  . No narrative on file   ROS-see HPI Constitutional:   No-   weight loss, night sweats, fevers, chills, fatigue, lassitude. HEENT:   No-  headaches, difficulty swallowing, tooth/dental problems, sore throat,       No-  sneezing, itching, ear ache, +nasal congestion, post nasal drip,  CV:  No-   chest pain, orthopnea, PND, swelling in lower extremities, anasarca, dizziness, palpitations Resp: +  shortness of breath with exertion or at rest.              +  productive cough,  No non-productive cough,  No- coughing up of blood.              No-   change in color of mucus.  No- wheezing.   Skin: No-   rash or lesions. GI:  + heartburn, indigestion, abdominal pain, no-nausea, vomiting, diarrhea,                 change in bowel habits, loss of appetite GU: No-   dysuria, change in color of urine, no urgency or frequency.  No- flank pain. MS:  N+ joint pain or swelling.  No- decreased range of motion.  No- back pain. Neuro-     nothing unusual Psych:  No- change in mood or affect. + depression or anxiety.  No memory loss.  OBJ- Physical Exam General- Alert, Oriented, Affect-appropriate, Distress- none acute, WDWN Skin- rash-none, lesions- none, excoriation- none Lymphadenopathy- none Head- atraumatic            Eyes- Gross vision intact, PERRLA, conjunctivae and secretions clear            Ears- Hearing, canals-normal            Nose- Clear, no-Septal dev, mucus, polyps,  erosion, perforation             Throat- Mallampati II , mucosa clear , drainage- none, tonsils- atrophic Neck- flexible , trachea midline, no stridor , thyroid nl, carotid no bruit Chest - symmetrical excursion , unlabored  Heart/CV- RRR/ occasional skip , no murmur , no gallop  , no rub, nl s1 s2                           - JVD- none , edema- none, stasis changes- none, varices- none           Lung- +bibasilar crackles, wheeze- none, cough- none , dullness-none, rub- none           Chest wall-  Abd- tender-no, distended-no, bowel sounds-present, HSM- no Br/ Gen/ Rectal- Not done, not indicated Extrem- cyanosis- none, clubbing, none, atrophy- none, strength- nl Neuro- grossly intact to observation

## 2012-09-09 NOTE — Patient Instructions (Addendum)
Order- CXR- dx dyxpnea  Please call as needed

## 2012-09-09 NOTE — Progress Notes (Signed)
Quick Note:  Pt aware of results. ______ 

## 2012-09-26 ENCOUNTER — Encounter: Payer: Self-pay | Admitting: Internal Medicine

## 2012-09-26 NOTE — Assessment & Plan Note (Signed)
We need to confirm COPD. Plan-look for old PFT, CXR

## 2012-11-02 ENCOUNTER — Ambulatory Visit: Payer: Medicare Other | Admitting: Nurse Practitioner

## 2012-11-07 ENCOUNTER — Ambulatory Visit (INDEPENDENT_AMBULATORY_CARE_PROVIDER_SITE_OTHER): Payer: Medicare Other | Admitting: Internal Medicine

## 2012-11-07 ENCOUNTER — Encounter: Payer: Self-pay | Admitting: Internal Medicine

## 2012-11-07 VITALS — BP 120/70 | HR 57 | Ht <= 58 in | Wt 134.0 lb

## 2012-11-07 DIAGNOSIS — J438 Other emphysema: Secondary | ICD-10-CM

## 2012-11-07 DIAGNOSIS — I10 Essential (primary) hypertension: Secondary | ICD-10-CM

## 2012-11-07 DIAGNOSIS — J439 Emphysema, unspecified: Secondary | ICD-10-CM

## 2012-11-07 NOTE — Progress Notes (Signed)
09/09/12- 89 yoF never smoker  self-referral to re-establish (last ov 06/2009).   here with a caregiver "Debbie" Reports a hacking cough, mostly at night.  using Spiriva daily She had pneumonia several times, starting at age 79. Husband smoked, representing secondhand smoke exposure. She describes shortness of breath especially with exertion on longer walks, dry cough. Denies exertional chest pain but does snore. No wheezing. Her breathing does not wake her. She has history of cardiomegaly and hypertension, diabetes. Latex allergy. Some history of seasonal allergic rhinitis and asthma. Widowed, retired from school system. A brother with emphysema. She has been using Spiriva. CXR 07/02/09 IMPRESSION:  Stable chronic lung disease. No acute cardiopulmonary process.  Provider: Darrelyn Hillock  11/07/12- 87 yoF never smoker followed for dyspnea, chronic bronchitis   Debbie, caregiver, is here FOLLOWS FOR: pt reports breathing is a little worse, not taking spiriva regularly -- states cough is not as bad as during last OV Skips Spiriva at times when she doesn't think she needs it. Little cough CXR 09/09/12  IMPRESSION:  Cardiomegaly and chronic lower lobe predominant scarring. No acute  superimposed process.  Original Report Authenticated By: Jeronimo Greaves, M.D.  ROS-see HPI Constitutional:   No-   weight loss, night sweats, fevers, chills, fatigue, lassitude. HEENT:   No-  headaches, difficulty swallowing, tooth/dental problems, sore throat,       No-  sneezing, itching, ear ache, +nasal congestion, post nasal drip,  CV:  No-   chest pain, orthopnea, PND, swelling in lower extremities, anasarca, dizziness, palpitations Resp: +  shortness of breath with exertion or at rest.              No-  productive cough,  No non-productive cough,  No- coughing up of blood.              No-   change in color of mucus.  No- wheezing.   Skin: No-   rash or lesions. GI:  + heartburn, indigestion, abdominal pain,  no-nausea, vomiting,  GU:  MS:  N+ joint pain or swelling.   Neuro-     nothing unusual Psych:  No- change in mood or affect. + depression or anxiety.  No memory loss.  OBJ- Physical Exam General- Alert, Oriented, Affect-appropriate, Distress- none acute, WDWN Skin- rash-none, lesions- none, excoriation- none Lymphadenopathy- none Head- atraumatic            Eyes- Gross vision intact, PERRLA, conjunctivae and secretions clear            Ears- Hearing, canals-normal            Nose- Clear, no-Septal dev, mucus, polyps, erosion, perforation             Throat- Mallampati II , mucosa clear , drainage- none, tonsils- atrophic Neck- flexible , trachea midline, no stridor , thyroid nl, carotid no bruit Chest - symmetrical excursion , unlabored           Heart/CV- RRR/ occasional skip , no murmur , no gallop  , no rub, nl s1 s2                           - JVD- none , edema- none, stasis changes- none, varices- none           Lung- + few bibasilar crackles, wheeze- none, cough- none , dullness-none, rub- none           Chest wall-  Abd-  Br/ Gen/  Rectal- Not done, not indicated Extrem- cyanosis- none, clubbing, none, atrophy- none, strength- nl Neuro- grossly intact to observation

## 2012-11-07 NOTE — Patient Instructions (Addendum)
I think your shortness of breath is from your hypertensive cardiac disease with enlarged heart, and some bronchitis from your history of second-hand smoke exposure in the past.   Ok to skip Spiriva at times when you don't feel you need it.  Flu vax

## 2012-11-13 NOTE — Assessment & Plan Note (Signed)
We discussed use of Spiriva. Watch for cardiogenic dyspnea. Plan-flu vaccine

## 2012-11-16 ENCOUNTER — Other Ambulatory Visit (HOSPITAL_BASED_OUTPATIENT_CLINIC_OR_DEPARTMENT_OTHER): Payer: Medicare Other | Admitting: Lab

## 2012-11-16 ENCOUNTER — Ambulatory Visit (HOSPITAL_BASED_OUTPATIENT_CLINIC_OR_DEPARTMENT_OTHER): Payer: Medicare Other | Admitting: Hematology & Oncology

## 2012-11-16 VITALS — BP 173/66 | HR 52 | Temp 97.9°F | Resp 14 | Ht 59.0 in | Wt 133.0 lb

## 2012-11-16 DIAGNOSIS — D509 Iron deficiency anemia, unspecified: Secondary | ICD-10-CM

## 2012-11-16 DIAGNOSIS — D649 Anemia, unspecified: Secondary | ICD-10-CM

## 2012-11-16 LAB — CBC WITH DIFFERENTIAL (CANCER CENTER ONLY)
BASO%: 0.2 % (ref 0.0–2.0)
HCT: 36.2 % (ref 34.8–46.6)
LYMPH%: 21.6 % (ref 14.0–48.0)
MCH: 30.1 pg (ref 26.0–34.0)
MCHC: 33.7 g/dL (ref 32.0–36.0)
MCV: 89 fL (ref 81–101)
MONO#: 1.2 10*3/uL — ABNORMAL HIGH (ref 0.1–0.9)
MONO%: 9.3 % (ref 0.0–13.0)
NEUT%: 68.2 % (ref 39.6–80.0)
Platelets: 264 10*3/uL (ref 145–400)
RDW: 13.4 % (ref 11.1–15.7)
WBC: 13.2 10*3/uL — ABNORMAL HIGH (ref 3.9–10.0)

## 2012-11-16 LAB — TECHNOLOGIST REVIEW CHCC SATELLITE

## 2012-11-16 LAB — RETICULOCYTES (CHCC)
RBC.: 4.12 MIL/uL (ref 3.87–5.11)
Retic Ct Pct: 2.1 % (ref 0.4–2.3)

## 2012-11-16 LAB — IRON AND TIBC CHCC
%SAT: 25 % (ref 21–57)
Iron: 68 ug/dL (ref 41–142)
TIBC: 276 ug/dL (ref 236–444)

## 2012-11-16 LAB — CHCC SATELLITE - SMEAR

## 2012-11-16 NOTE — Progress Notes (Signed)
This office note has been dictated.

## 2012-11-29 NOTE — Progress Notes (Signed)
CC:   Jerl Mina, MD  DIAGNOSES: 1. Chronic leukocytosis. 2. Intermittent iron-deficiency anemia.  CURRENT THERAPY:  IV iron as indicated.  INTERIM HISTORY:  Ms. Smay comes in for followup.  We last saw her back in May.  She is under a lot of stress right now.  Apparently, her daughter is not doing too well.  She last got iron back in May.  At that point in time, her ferritin was 207 with an iron saturation of 15%.  She has had no bleeding.  She has had no cough or shortness of breath. She has had no leg swelling.  There have been no rashes.  She has lost a little bit of weight.  Her appetite seems to be doing fairly well.  She continues on her slew of medications.  She has numerous medical problems.  I think some of the anemia and leukocytosis probably is from her medications.  She has had no nausea or vomiting.  Overall, her performance status is ECOG 1-2.  PHYSICAL EXAMINATION:  General:  This is an elderly white female in no obvious distress.  Vital signs:  Temperature of 97.9, pulse 52, respiratory 14, blood pressure 173/66.  Weight is 133.  Head and neck: Normocephalic, atraumatic skull.  There are no ocular or oral lesions. There are no palpable cervical or supraclavicular lymph nodes.  Lungs: Clear bilaterally.  Cardiac:  Regular rate and rhythm with a normal S1 and S2.  There are no murmurs, rubs, or bruits.  Abdomen:  Soft.  She has good bowel sounds.  There is no fluid wave.  There is no palpable hepatosplenomegaly.  Back:  No tenderness over the spine, ribs, or hips. Extremities:  No clubbing, cyanosis, or edema.  She has osteoarthritic changes in her joints.  She has good strength in her legs and arms. Skin:  No rashes.  She has numerous skin lesions which do not appear suspicious.  Neurological:  No focal neurological deficits.  LABORATORY STUDIES:  White cell count is 13.2, hemoglobin 12.2, hematocrit 36.2, platelet count 254.  MCV is 89.  Peripheral  smear shows a normochromic, normocytic population of red blood cells.  There are no nucleated red blood cells.  I see no teardrop cells.  She has no rouleaux formation.  White cells are mildly increased in number.  She has good maturation of her white blood cells.  There are no hypersegmented polys.  I see no immature myeloid or lymphoid forms. Platelets are adequate in number and size.  IMPRESSION:  Ms. Throgmorton is a very charming 79 year old white female. Again, I think her white cells are reactive.  I do not see anything that would look like a myeloproliferative bone marrow issue.  Even if she does have one, we do not have to treat this.  We will see what her iron studies are.  Her hemoglobin is much better.  Again, I think with all of her medicines, she just does not absorb iron well.  We will plan to get her back in another 3 to 4 months.  We will try to get her through the holidays, if possible.    ______________________________ Josph Macho, M.D. PRE/MEDQ  D:  11/16/2012  T:  11/29/2012  Job:  [id number]

## 2013-01-18 ENCOUNTER — Ambulatory Visit: Payer: Medicare Other | Admitting: Cardiology

## 2013-01-19 ENCOUNTER — Ambulatory Visit: Payer: Medicare Other | Admitting: Cardiology

## 2013-01-21 ENCOUNTER — Emergency Department: Payer: Self-pay | Admitting: Emergency Medicine

## 2013-01-21 LAB — URINALYSIS, COMPLETE
Bacteria: NONE SEEN
Bilirubin,UR: NEGATIVE
Ketone: NEGATIVE
Ph: 7 (ref 4.5–8.0)
Protein: NEGATIVE
RBC,UR: 1 /HPF (ref 0–5)
Specific Gravity: 1.01 (ref 1.003–1.030)
Squamous Epithelial: <1
WBC UR: 5 /HPF (ref 0–5)

## 2013-01-21 LAB — CBC WITH DIFFERENTIAL/PLATELET
Eosinophil #: 0.4 10*3/uL (ref 0.0–0.7)
HGB: 11.9 g/dL — ABNORMAL LOW (ref 12.0–16.0)
Lymphocyte %: 24.2 %
MCHC: 34.3 g/dL (ref 32.0–36.0)
Monocyte #: 1 x10 3/mm — ABNORMAL HIGH (ref 0.2–0.9)
Neutrophil #: 7.3 10*3/uL — ABNORMAL HIGH (ref 1.4–6.5)
Neutrophil %: 63.2 %
Platelet: 260 10*3/uL (ref 150–440)
RBC: 3.97 10*6/uL (ref 3.80–5.20)
RDW: 13.5 % (ref 11.5–14.5)
WBC: 11.6 10*3/uL — ABNORMAL HIGH (ref 3.6–11.0)

## 2013-01-21 LAB — BASIC METABOLIC PANEL
Anion Gap: 7 (ref 7–16)
BUN: 18 mg/dL (ref 7–18)
Chloride: 97 mmol/L — ABNORMAL LOW (ref 98–107)
Co2: 27 mmol/L (ref 21–32)
Creatinine: 0.68 mg/dL (ref 0.60–1.30)
EGFR (Non-African Amer.): >60
Glucose: 123 mg/dL — ABNORMAL HIGH (ref 65–99)
Osmolality: 266 (ref 275–301)
Sodium: 131 mmol/L — ABNORMAL LOW (ref 136–145)

## 2013-01-21 LAB — PROTIME-INR
INR: 0.9
Prothrombin Time: 12.6 secs (ref 11.5–14.7)

## 2013-02-06 NOTE — Progress Notes (Signed)
CC:   Jerl Mina, MD  DIAGNOSES: 1. Chronic leukocytosis. 2. Intermittent iron-deficiency anemia.  CURRENT THERAPY:  IV iron as indicated.  INTERIM HISTORY:  Ms. Debes comes in for followup.  We last saw her back in May.  She is under a lot of stress right now.  Apparently, her daughter is not doing too well.  She last got iron back in May.  At that point in time, her ferritin was 207 with iron saturation of 15%.  She has had no bleeding.  She has had no cough or shortness of breath. She has had no leg swelling.  There has been no rashes.  She has lost a little bit of weight.  Her appetite seems to be doing fairly well.  She continues on her slew of medications.  She has numerous medical problems.  I think some of the anemia and leukocytosis probably is from her medications.  She has had no nausea or vomiting.  Overall, her performance status is ECOG 1-2.  PHYSICAL EXAMINATION:  General:  This is an elderly white female in no obvious distress.  Vital Signs:  Temperature of 97.9, pulse 52, respiratory rate 14, blood pressure 173/66, weight is 133.  Head and Neck:  Normocephalic, atraumatic skull.  There are no ocular or oral lesions.  There are no palpable cervical or supraclavicular lymph nodes. Lungs:  Clear bilaterally.  Cardiac:  Regular rate and rhythm with normal S1, S2.  There are no murmurs, rubs or bruits.  Abdomen:  Soft. She has good bowel sounds.  There is no fluid wave.  There is no palpable hepatosplenomegaly.  Back:  No tenderness over the spine, ribs, or hips.  Extremities:  Show no clubbing, cyanosis or edema.  She has osteoarthritic changes in her joints.  She has good strength in her legs and arms.  Skin:  Shows no rashes.  She has numerous skin lesions, which do not appear suspicious.  Neurologic:  Shows no focal neurological deficits.  LABORATORY STUDIES:  White cell count is 13.2, hemoglobin 12.2, hematocrit 36.2, platelet count 254.  MCV is  89.  Peripheral smear shows a normochromic, normocytic population of red blood cells.  There are no nucleated red blood cells.  I see no teardrop cells.  She has no rouleaux formation.  White cells are mildly increased in number.  She has good maturation of her white blood cells.  There are no hypersegmented polys.  I see no immature myeloid or lymphoid forms. Platelets are adequate in number and size.  IMPRESSION:  Ms. Smejkal is a very charming 79 year old white female. Again, I think her white cells are reactive.  I do not see anything that would look like a myeloproliferative bone marrow issue.  Even if she does have one, we do not have to treat this.  We will see what her iron studies are.  Her hemoglobin is much better.  Again, I think with all of her medicines, she just does not absorb iron well.  We will plan to get her back in another 3-4 months.  We will try to get her through the holidays if possible.    ______________________________ Josph Macho, M.D. PRE/MEDQ  D:  11/16/2012  T:  02/05/2013  Job:  [id number]

## 2013-02-15 ENCOUNTER — Ambulatory Visit (HOSPITAL_BASED_OUTPATIENT_CLINIC_OR_DEPARTMENT_OTHER): Payer: Medicare Other | Admitting: Hematology & Oncology

## 2013-02-15 ENCOUNTER — Other Ambulatory Visit (HOSPITAL_BASED_OUTPATIENT_CLINIC_OR_DEPARTMENT_OTHER): Payer: Medicare Other | Admitting: Lab

## 2013-02-15 VITALS — BP 149/58 | HR 60 | Temp 97.7°F | Resp 14 | Ht 59.0 in | Wt 128.0 lb

## 2013-02-15 DIAGNOSIS — D509 Iron deficiency anemia, unspecified: Secondary | ICD-10-CM

## 2013-02-15 DIAGNOSIS — D649 Anemia, unspecified: Secondary | ICD-10-CM

## 2013-02-15 LAB — CBC WITH DIFFERENTIAL (CANCER CENTER ONLY)
BASO#: 0 10*3/uL (ref 0.0–0.2)
BASO%: 0.1 % (ref 0.0–2.0)
EOS%: 0.4 % (ref 0.0–7.0)
HGB: 12.4 g/dL (ref 11.6–15.9)
LYMPH#: 2.5 10*3/uL (ref 0.9–3.3)
MCHC: 34.3 g/dL (ref 32.0–36.0)
NEUT#: 10.8 10*3/uL — ABNORMAL HIGH (ref 1.5–6.5)

## 2013-02-15 LAB — TECHNOLOGIST REVIEW CHCC SATELLITE

## 2013-02-15 LAB — CHCC SATELLITE - SMEAR

## 2013-02-15 NOTE — Progress Notes (Signed)
This office note has been dictated.

## 2013-02-16 LAB — IRON AND TIBC CHCC
Iron: 41 ug/dL (ref 41–142)
TIBC: 263 ug/dL (ref 236–444)

## 2013-02-17 NOTE — Progress Notes (Signed)
CC:   [name] Mina, MD  DIAGNOSES: 1. Chronic leukocytosis. 2. Intermittent iron-deficiency anemia.  CURRENT THERAPY:  IV iron as indicated.  INTERIM HISTORY:  [name] comes in for followup.  We last saw her back in September.  Since then, she has had a really tough time.  Apparently, she had a syncopal episode at home.  Some still not clear as to exactly what happened to her.  She went to [facility], they sent her to [facility]  She apparently had a bleed in the brain.  This apparently was not big enough that would require any intervention.  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  Again, I am not sure how this was diagnosed.  She was placed on prednisone.  She completed the prednisone.  She thinks this "Addison disease" is what her problem is.  She thinks that she has had it for a longer time.  I told her that I do not think that she had Addison's for a longer time, even if she had at all.  With Addison's, you need to be on steroid replacement therapy or else you really cannot survive.  When we last saw her in September, her ferritin was 574 with an iron saturation of 25%.  Then she last got IV iron back in May.  She wants to be seen by an endocrinologist.  I told her that she can talk to [name] about this.  I tried to explain to her what Addison disease is and how it is diagnosed.  Again, we do not do those kind of tests here.  She will have to go to an endocrinologist or [name], if he has not done this already.  Otherwise, she is doing okay.  She has a rolling walker that she gets around with.  There has been no bleeding.  She has had no leg swelling.  The patient says her sisters have diabetes.  She is on Glucophage for this.  PHYSICAL EXAMINATION:  On her physical exam, this is a petite, elderly white female in no obvious distress.  Vital Signs:  Her temperature of 97.7, pulse 60, respiratory rate 14, blood pressure 149/58, weight is 128  pounds.  Head and neck exam shows a normocephalic, atraumatic skull. I do not see any trauma to the scalp.  She has no scleral icterus. Pupils react appropriately.  There is no adenopathy in the neck.  Lungs are clear bilaterally.  Cardiac Exam:  Regular rate and rhythm with a normal S1, S2.  There are no murmurs, rubs, or bruits.  Abdomen is soft. She has good bowel sounds.  There is no fluid wave.  There is no palpable abdominal mass.  She has no palpable hepatosplenomegaly.  Back exam shows some kyphosis.  She has no tenderness over the spine, ribs, or hips.  Extremities show age-related osteoarthritic changes.  She has some muscle atrophy in upper and lower extremities.  She has some decent range motion of her joints.  Neurological exam shows no focal neurological deficits.  LABORATORY STUDIES:  White cell count is 14.2, hemoglobin 12.4, hematocrit 36.2, platelet count is 298.  MCV is 88.  IMPRESSION:  [name] is a very nice 79 year old white female.  Her white cell count is up a little bit more.  I think this __________ from her being on some steroids.  I do not see any issues with respect to iron deficiency.  Again, will see what her iron studies show.  I  am still not sure exactly what happened to her.  I am still not sure about her having Addison disease.  I will plan to get her back in another 3 months.  I do not see any else that we need to do with her from a hematologic point of view.    ______________________________ Josph Macho, M.D. PRE/MEDQ  D:  02/15/2013  T:  02/16/2013  Job:  [id number]

## 2013-02-18 LAB — RETICULOCYTES (CHCC): ABS Retic: 90.5 10*3/uL (ref 19.0–186.0)

## 2013-03-12 ENCOUNTER — Observation Stay: Payer: Self-pay | Admitting: Internal Medicine

## 2013-03-12 LAB — URINALYSIS, COMPLETE
BILIRUBIN, UR: NEGATIVE
BLOOD: NEGATIVE
GLUCOSE, UR: NEGATIVE mg/dL (ref 0–75)
KETONE: NEGATIVE
Nitrite: POSITIVE
PH: 5 (ref 4.5–8.0)
SPECIFIC GRAVITY: 1.025 (ref 1.003–1.030)
Squamous Epithelial: 4

## 2013-03-12 LAB — COMPREHENSIVE METABOLIC PANEL
AST: 21 U/L (ref 15–37)
Albumin: 3.6 g/dL (ref 3.4–5.0)
Alkaline Phosphatase: 58 U/L
Anion Gap: 6 — ABNORMAL LOW (ref 7–16)
BUN: 19 mg/dL — AB (ref 7–18)
Bilirubin,Total: 0.4 mg/dL (ref 0.2–1.0)
Calcium, Total: 8.8 mg/dL (ref 8.5–10.1)
Chloride: 98 mmol/L (ref 98–107)
Co2: 26 mmol/L (ref 21–32)
Creatinine: 0.64 mg/dL (ref 0.60–1.30)
EGFR (African American): >60
Glucose: 119 mg/dL — ABNORMAL HIGH (ref 65–99)
Osmolality: 264 (ref 275–301)
POTASSIUM: 3.3 mmol/L — AB (ref 3.5–5.1)
SGPT (ALT): 27 U/L (ref 12–78)
Sodium: 130 mmol/L — ABNORMAL LOW (ref 136–145)
Total Protein: 6.5 g/dL (ref 6.4–8.2)

## 2013-03-12 LAB — CBC WITH DIFFERENTIAL/PLATELET
BASOS PCT: 0.7 %
Basophil #: 0.1 10*3/uL (ref 0.0–0.1)
Eosinophil #: 0.2 10*3/uL (ref 0.0–0.7)
Eosinophil %: 1.2 %
HCT: 34 % — ABNORMAL LOW (ref 35.0–47.0)
HGB: 11.8 g/dL — AB (ref 12.0–16.0)
LYMPHS ABS: 2.7 10*3/uL (ref 1.0–3.6)
Lymphocyte %: 16.4 %
MCH: 29.5 pg (ref 26.0–34.0)
MCHC: 34.6 g/dL (ref 32.0–36.0)
MCV: 85 fL (ref 80–100)
MONOS PCT: 8.5 %
Monocyte #: 1.4 x10 3/mm — ABNORMAL HIGH (ref 0.2–0.9)
NEUTROS ABS: 11.8 10*3/uL — AB (ref 1.4–6.5)
Neutrophil %: 73.2 %
PLATELETS: 263 10*3/uL (ref 150–440)
RBC: 3.99 10*6/uL (ref 3.80–5.20)
RDW: 14 % (ref 11.5–14.5)
WBC: 16.1 10*3/uL — ABNORMAL HIGH (ref 3.6–11.0)

## 2013-03-13 LAB — CBC WITH DIFFERENTIAL/PLATELET
BASOS ABS: 0.1 10*3/uL (ref 0.0–0.1)
BASOS PCT: 0.4 %
EOS ABS: 0.3 10*3/uL (ref 0.0–0.7)
EOS PCT: 2.1 %
HCT: 33.3 % — AB (ref 35.0–47.0)
HGB: 11.6 g/dL — ABNORMAL LOW (ref 12.0–16.0)
Lymphocyte #: 2.3 10*3/uL (ref 1.0–3.6)
Lymphocyte %: 18.1 %
MCH: 29.7 pg (ref 26.0–34.0)
MCHC: 34.9 g/dL (ref 32.0–36.0)
MCV: 85 fL (ref 80–100)
MONOS PCT: 9 %
Monocyte #: 1.1 x10 3/mm — ABNORMAL HIGH (ref 0.2–0.9)
Neutrophil #: 9 10*3/uL — ABNORMAL HIGH (ref 1.4–6.5)
Neutrophil %: 70.4 %
Platelet: 261 10*3/uL (ref 150–440)
RBC: 3.9 10*6/uL (ref 3.80–5.20)
RDW: 13.9 % (ref 11.5–14.5)
WBC: 12.8 10*3/uL — AB (ref 3.6–11.0)

## 2013-03-13 LAB — BASIC METABOLIC PANEL
ANION GAP: 5 — AB (ref 7–16)
BUN: 14 mg/dL (ref 7–18)
CALCIUM: 9 mg/dL (ref 8.5–10.1)
CREATININE: 0.6 mg/dL (ref 0.60–1.30)
Chloride: 101 mmol/L (ref 98–107)
Co2: 27 mmol/L (ref 21–32)
EGFR (Non-African Amer.): >60
Glucose: 124 mg/dL — ABNORMAL HIGH (ref 65–99)
Osmolality: 268 (ref 275–301)
Potassium: 4.3 mmol/L (ref 3.5–5.1)
Sodium: 133 mmol/L — ABNORMAL LOW (ref 136–145)

## 2013-03-13 LAB — MAGNESIUM: Magnesium: 1.6 mg/dL — ABNORMAL LOW

## 2013-03-14 ENCOUNTER — Telehealth: Payer: Self-pay | Admitting: Family Medicine

## 2013-03-14 LAB — URINE CULTURE

## 2013-03-14 NOTE — Telephone Encounter (Signed)
Spoke with [name] and advised that pt is no longer a pt here, he understood and will call [facility]

## 2013-03-14 NOTE — Telephone Encounter (Signed)
[facility] Triage Call Report Triage Record Num: [id number] Operator: Noemi Chapel Patient Name: Marin Roberts Call Date & Time: 03/13/2013 7:11:04PM Patient Phone: PCP: Frederico Hamman Copland Patient Gender: Female PCP Fax : [phone] Patient DOB: February 16, 1925 Practice Name: [facility] Reason for Call: Caller: Tom/RN at [facility]; PCP: Donella Stade CB#: [phone]; Tom calling to let Dr Edilia Bo know that Mrs Deese was admitted today, Monday 03/13/13 with diagnosis of left pubi rami fracture. She will be getting Physical and Occupational Therapy. Caller advised note will be sent for PCP. Protocol(s) Used: Office Note Recommended Outcome per Protocol: Information Noted and Sent to Office Reason for Outcome: Caller information to office Care Advice: ~ 03/13/2013 7:14:48PM Page 1 of 1 [id number]

## 2013-03-14 NOTE — Telephone Encounter (Signed)
This patient left my practice 4 years ago. Her PCP is Dr. Gorden Harms at [facility]. This call does not make much sense to me, but please let [facility] know.  I will not be accepting her into my practice again.  Dee, would you mind calling them, since you know [facility].  Owens Loffler, MD

## 2013-03-16 ENCOUNTER — Telehealth: Payer: Self-pay | Admitting: Hematology & Oncology

## 2013-03-16 NOTE — Telephone Encounter (Signed)
Left message moved 3-18 to 3-19 °

## 2013-04-20 ENCOUNTER — Ambulatory Visit: Payer: Medicare Other | Admitting: Endocrinology

## 2013-04-26 ENCOUNTER — Encounter: Payer: Self-pay | Admitting: Endocrinology

## 2013-04-26 ENCOUNTER — Other Ambulatory Visit: Payer: Self-pay | Admitting: *Deleted

## 2013-04-26 ENCOUNTER — Ambulatory Visit (INDEPENDENT_AMBULATORY_CARE_PROVIDER_SITE_OTHER): Payer: 59 | Admitting: Endocrinology

## 2013-04-26 VITALS — BP 118/70 | HR 61 | Temp 98.0°F | Resp 14 | Ht <= 58 in | Wt 128.0 lb

## 2013-04-26 DIAGNOSIS — E039 Hypothyroidism, unspecified: Secondary | ICD-10-CM

## 2013-04-26 DIAGNOSIS — D649 Anemia, unspecified: Secondary | ICD-10-CM

## 2013-04-26 DIAGNOSIS — R5381 Other malaise: Secondary | ICD-10-CM

## 2013-04-26 DIAGNOSIS — D509 Iron deficiency anemia, unspecified: Secondary | ICD-10-CM

## 2013-04-26 DIAGNOSIS — E871 Hypo-osmolality and hyponatremia: Secondary | ICD-10-CM

## 2013-04-26 DIAGNOSIS — R5383 Other fatigue: Principal | ICD-10-CM

## 2013-04-26 LAB — CBC WITH DIFFERENTIAL/PLATELET
BASOS ABS: 0.1 10*3/uL (ref 0.0–0.1)
BASOS PCT: 0.5 % (ref 0.0–3.0)
Eosinophils Absolute: 0.4 10*3/uL (ref 0.0–0.7)
Eosinophils Relative: 3.8 % (ref 0.0–5.0)
HEMATOCRIT: 36.7 % (ref 36.0–46.0)
Hemoglobin: 11.9 g/dL — ABNORMAL LOW (ref 12.0–15.0)
LYMPHS ABS: 3 10*3/uL (ref 0.7–4.0)
Lymphocytes Relative: 27.3 % (ref 12.0–46.0)
MCHC: 32.4 g/dL (ref 30.0–36.0)
MCV: 89.5 fl (ref 78.0–100.0)
Monocytes Absolute: 0.7 10*3/uL (ref 0.1–1.0)
Monocytes Relative: 6.9 % (ref 3.0–12.0)
Neutro Abs: 6.7 10*3/uL (ref 1.4–7.7)
Neutrophils Relative %: 61.5 % (ref 43.0–77.0)
Platelets: 263 10*3/uL (ref 150.0–400.0)
RBC: 4.11 Mil/uL (ref 3.87–5.11)
RDW: 14.2 % (ref 11.5–14.6)
WBC: 10.9 10*3/uL — ABNORMAL HIGH (ref 4.5–10.5)

## 2013-04-26 LAB — BASIC METABOLIC PANEL WITH GFR
BUN: 16 mg/dL (ref 6–23)
CO2: 24 meq/L (ref 19–32)
Calcium: 9.2 mg/dL (ref 8.4–10.5)
Chloride: 98 meq/L (ref 96–112)
Creatinine, Ser: 0.8 mg/dL (ref 0.4–1.2)
GFR: 77.82 mL/min
Glucose, Bld: 119 mg/dL — ABNORMAL HIGH (ref 70–99)
Potassium: 4.1 meq/L (ref 3.5–5.1)
Sodium: 132 meq/L — ABNORMAL LOW (ref 135–145)

## 2013-04-26 LAB — T4, FREE: Free T4: 1.11 ng/dL (ref 0.60–1.60)

## 2013-04-26 LAB — CORTISOL: Cortisol, Plasma: 2.7 ug/dL

## 2013-04-26 NOTE — Progress Notes (Signed)
Patient ID: [name], female   DOB: 1925-02-04, 80 y.o.   MRN: [id number]   Chief complaint: Gets tired and sleepy  History of Present Illness:  Patient complains of feeling tired easily for the last 4 months or so. She says that after a short time of being up and about she gets sapped of energy and has to lie down and rest. Does not feel sleepy or lightheaded. She has to do that 4-5 times during the day She thinks her complaints started after she had an episode of syncope at home where she blacked out without knowing what happened. This is apparently while she was taking an antibiotic for UTI. She was not told why she had the syncope and was not told that her blood pressure was low She apparently did have head injury and subsequently was in rehabilitation also For some reason the patient was told that she may have Addison's disease and it is not clear in her PCP is not how this came about The patient denies the following symptoms: Lightheadedness on standing up, unusually decreased appetite, nausea or vomiting, diarrhea, recent weight loss, increased pigmentation of the skin, low blood pressure Her last sodium level was 133 on 03/30/13   Past Medical History  Diagnosis Date  . Hypertension   . Diabetes mellitus   . Hypothyroidism   . Vertigo   . Anemia   . Osteoarthritis   . GERD (gastroesophageal reflux disease)   . Diverticulosis   . Depression   . H/O: hysterectomy     at age 42  . COPD (chronic obstructive pulmonary disease)   . History of DVT (deep vein thrombosis)   . Cancer     basal cell  . Anxiety   . Dyspnea   . Enlarged heart   . Fibromyalgia   . Degenerative joint disease   . Degenerative disk disease   . Tremor   . Falls   . Iron deficiency anemia, unspecified 07/15/2012    Past Surgical History  Procedure Laterality Date  . Cholecystectomy  1944  . Appendectomy  1952  . Ovarian cyst removal  1982    secondary ventral hematoma  . Bladder tack  1997   . Cataract extraction, bilateral    . Kyphosis surgery    . Total abdominal hysterectomy    . Salpingoophorectomy      unilateral salpingo-oophorectomy    Family History  Problem Relation Age of Onset  . Stroke Mother   . Cancer Father     Pancreatic Cancer  . Kidney disease Sister   . Heart disease Brother   . Stroke Brother   . Heart disease Brother     Social History:  reports that she has never smoked. She has never used smokeless tobacco. She reports that she does not drink alcohol or use illicit drugs.  Allergies:  Allergies  Allergen Reactions  . Aricept [Donepezil Hydrochloride]     REACTION: delusions  . Hydrocodone-Acetaminophen     REACTION: U/K  . Metoprolol     REACTION: dizzy  . Nitrofurantoin   . Sulfonamide Derivatives     REACTION: unspecified      Medication List       This list is accurate as of: 04/26/13  2:12 PM.  Always use your most recent med list.               ACCU-CHEK AVIVA PLUS test strip  Generic drug:  glucose blood     ACCU-CHEK SOFTCLIX  LANCETS lancets     ADVIL 200 MG Caps  Generic drug:  Ibuprofen  Take 200 mg by mouth 2 (two) times daily.     amLODipine 10 MG tablet  Commonly known as:  NORVASC  Take 5 mg by mouth daily.     aspirin 81 MG tablet  Take 81 mg by mouth daily.     clorazepate 3.75 MG tablet  Commonly known as:  TRANXENE  Take 3.75 mg by mouth at bedtime.     COLACE PO  Take by mouth every morning.     esomeprazole 40 MG capsule  Commonly known as:  NEXIUM  Take 40 mg by mouth daily.     GLUCOPHAGE XR 750 MG 24 hr tablet  Generic drug:  metFORMIN  Take 750 mg by mouth 2 (two) times daily.     indomethacin 75 MG CR capsule  Commonly known as:  INDOCIN SR  Take 75 mg by mouth daily.     levothyroxine 75 MCG tablet  Commonly known as:  SYNTHROID, LEVOTHROID  Take 75 mcg by mouth daily.     metoprolol succinate 100 MG 24 hr tablet  Commonly known as:  TOPROL XL  Take 1 tablet (100 mg  total) by mouth daily.     MULTIVITAMIN PO  Take 1 tablet by mouth daily.     ranitidine 75 MG tablet  Commonly known as:  ZANTAC  Take 75 mg by mouth 2 (two) times daily.     sertraline 50 MG tablet  Commonly known as:  ZOLOFT  Take 50 mg by mouth daily.     SYSTANE 0.4-0.3 % Soln  Generic drug:  Polyethyl Glycol-Propyl Glycol  Apply to eye as needed.     tiotropium 18 MCG inhalation capsule  Commonly known as:  SPIRIVA  Place 1 capsule (18 mcg total) into inhaler and inhale daily.        No visits with results within 1 Week(s) from this visit. Latest known visit with results is:  Appointment on 02/15/2013  Component Date Value Ref Range Status  . WBC 02/15/2013 14.2* 3.9 - 10.0 10e3/uL Final  . RBC 02/15/2013 4.13  3.70 - 5.32 10e6/uL Final  . HGB 02/15/2013 12.4  11.6 - 15.9 g/dL Final  . HCT 02/15/2013 36.2  34.8 - 46.6 % Final  . MCV 02/15/2013 88  81 - 101 fL Final  . MCH 02/15/2013 30.0  26.0 - 34.0 pg Final  . MCHC 02/15/2013 34.3  32.0 - 36.0 g/dL Final  . RDW 02/15/2013 13.3  11.1 - 15.7 % Final  . Platelets 02/15/2013 298  145 - 400 10e3/uL Final  . NEUT# 02/15/2013 10.8* 1.5 - 6.5 10e3/uL Final  . LYMPH# 02/15/2013 2.5  0.9 - 3.3 10e3/uL Final  . MONO# 02/15/2013 0.9  0.1 - 0.9 10e3/uL Final  . Eosinophils Absolute 02/15/2013 0.1  0.0 - 0.5 10e3/uL Final  . BASO# 02/15/2013 0.0  0.0 - 0.2 10e3/uL Final  . NEUT% 02/15/2013 75.6  39.6 - 80.0 % Final  . LYMPH% 02/15/2013 17.7  14.0 - 48.0 % Final  . MONO% 02/15/2013 6.2  0.0 - 13.0 % Final  . EOS% 02/15/2013 0.4  0.0 - 7.0 % Final  . BASO% 02/15/2013 0.1  0.0 - 2.0 % Final  . Erythropoietin 02/15/2013 4.6  2.6 - 18.5 mIU/mL Final    Because of diurnal variations in Erythropoietin levels, it isimportant to collect samples at a consistent time of day.  Morningsamples collected between  7:30 AM and 12:00 noon are recommended.   . Iron 02/15/2013 41  41 - 142 ug/dL Final  . TIBC 02/15/2013 263  236 - 444 ug/dL  Final  . UIBC 02/15/2013 221  120 - 384 ug/dL Final  . %SAT 02/15/2013 16* 21 - 57 % Final  . Ferritin 02/15/2013 579* 9 - 269 ng/ml Final  . Transferrin Receptor, Soluble 02/15/2013 1.33  0.76 - 1.76 mg/L Final   Comment: Test performed by:           [facility]           [address]           [place] [address]           Phone:  [phone]                   [phone] Director: Rebeca Alert. Leandro Reasoner, M.D., [place] Reported by                           [facility] [address], [place], [place] [address]Kenneth Joie Bimler, M.D., Ph.D., Director of [phone], [place] [phone]  . Retic Ct Pct 02/15/2013 2.1  0.4 - 2.3 % Final  . RBC. 02/15/2013 4.31  3.87 - 5.11 MIL/uL Final  . ABS Retic 02/15/2013 90.5  19.0 - 186.0 K/uL Final  . Smear Result 02/15/2013 Smear Available   Final  . Tech Review 02/15/2013 Rare Myelocyte   Final     REVIEW OF SYSTEMS:            She has a tendency to getting off balance and is using a wheelchair. Has had history of multiple falls      She has had diabetes since about 1997; has been on metformin for at least 4 years and she thinks her blood sugar in the morning is usually near-normal. Her last A1c was 6.3     No unusual headaches.       Skin:  For the last few weeks she is getting her rash on her arms without itching     Thyroid:   She has been hypothyroid for several years and has been on the same dose of 75 mcg. Last TSH was 1.36 on 01/18/13     His blood pressure has been up increased or years. Currently taking amlodipine .     No swelling of feet.     No abdominal pain.  Bowel habits:  No change.      No joint  Pains. Has been told to have [place] to proceed but is not on any treatment except vitamin D, baseline level of 15.9. She has a history of pelvic fracture and rib fracture.     No numbness in her feet or tingling. She watches her feet regularly        She gets confused at times.   PHYSICAL EXAM:  BP 118/70  Pulse 61  Temp(Src) 98 F (36.7 C)  Resp 14  Ht 4' 9.75" (1.467 m)  Wt 128 lb (58.06 kg)  BMI 26.98 kg/m2  SpO2 99%  Standing blood pressure 125/60 left arm   GENERAL: Short stated, averagely built and nourished, pleasant and cooperative  No pallor, clubbing, lymphadenopathy or edema.  Skin:  no rash or pigmentation.  EYES:  Externally normal.  Fundii:  normal discs and vessels.  ENT: Oral mucosa and tongue normal. No mucosal pigmentation  THYROID:  Not  palpable.  HEART:  Normal  S1 and S2; no murmur or click.  CHEST:  Normal shape.  Lungs: Vescicular breath sounds heard equally.  No crepitations/ wheeze.  ABDOMEN:  No distention.  Liver and spleen not palpable.  No other mass or tenderness.  NEUROLOGICAL: .Reflexes are bilaterally normal at biceps  SPINE AND JOINTS:  Minimal osteoarthritic changes. Has significant lower thoracic/upper lumbar kyphosis  ASSESSMENT:   Easy fatigability of unclear etiology.  Will need to rule out secondary hypothyroidism. No suggestive symptoms of sleep apnea or muscle disease  Does not have any symptoms suggestive of Addison's disease with no recent weight change, GI symptoms,  lightheadedness,orthostatic change in blood pressure.  She does have a minimal decrease in sodium which could be due to mild SIADH related to her taking Zoloft   PLAN:   Check free T4 and cortisol level today If cortisol level is in the low normal range or low  we can followup with a ACTH stimulation test  However if her labs are quite normal she will need to followup with PCP for further evaluation  Chaka Jefferys 04/26/2013, 2:12 PM   Addendum: Labs as follows, cortisol level normal, will proceed with ACTH stimulation test in the fasting state  Office Visit on 04/26/2013  Component Date Value Ref Range Status  . Sodium 04/26/2013 132* 135 - 145 mEq/L Final  . Potassium 04/26/2013 4.1  3.5 - 5.1  mEq/L Final  . Chloride 04/26/2013 98  96 - 112 mEq/L Final  . CO2 04/26/2013 24  19 - 32 mEq/L Final  . Glucose, Bld 04/26/2013 119* 70 - 99 mg/dL Final  . BUN 04/26/2013 16  6 - 23 mg/dL Final  . Creatinine, Ser 04/26/2013 0.8  0.4 - 1.2 mg/dL Final  . Calcium 04/26/2013 9.2  8.4 - 10.5 mg/dL Final  . GFR 04/26/2013 77.82  >60.00 mL/min Final  . Free T4 04/26/2013 1.11  0.60 - 1.60 ng/dL Final  . Cortisol, Plasma 04/26/2013 2.7   Final   AM:  4.3 - 22.4 ug/dLPM:  3.1 - 16.7 ug/dL  . WBC 04/26/2013 10.9* 4.5 - 10.5 K/uL Final  . RBC 04/26/2013 4.11  3.87 - 5.11 Mil/uL Final  . Hemoglobin 04/26/2013 11.9* 12.0 - 15.0 g/dL Final  . HCT 04/26/2013 36.7  36.0 - 46.0 % Final  . MCV 04/26/2013 89.5  78.0 - 100.0 fl Final  . MCHC 04/26/2013 32.4  30.0 - 36.0 g/dL Final  . RDW 04/26/2013 14.2  11.5 - 14.6 % Final  . Platelets 04/26/2013 263.0  150.0 - 400.0 K/uL Final  . Neutrophils Relative % 04/26/2013 61.5  43.0 - 77.0 % Final  . Lymphocytes Relative 04/26/2013 27.3  12.0 - 46.0 % Final  . Monocytes Relative 04/26/2013 6.9  3.0 - 12.0 % Final  . Eosinophils Relative 04/26/2013 3.8  0.0 - 5.0 % Final  . Basophils Relative 04/26/2013 0.5  0.0 - 3.0 % Final  . Neutro Abs 04/26/2013 6.7  1.4 - 7.7 K/uL Final  . Lymphs Abs 04/26/2013 3.0  0.7 - 4.0 K/uL Final  . Monocytes Absolute 04/26/2013 0.7  0.1 - 1.0 K/uL Final  . Eosinophils Absolute 04/26/2013 0.4  0.0 - 0.7 K/uL Final  . Basophils Absolute 04/26/2013 0.1  0.0 - 0.1 K/uL Final

## 2013-05-03 ENCOUNTER — Other Ambulatory Visit: Payer: Self-pay | Admitting: Dermatology

## 2013-05-03 ENCOUNTER — Telehealth: Payer: Self-pay | Admitting: *Deleted

## 2013-05-03 ENCOUNTER — Other Ambulatory Visit: Payer: Medicare Other

## 2013-05-03 ENCOUNTER — Other Ambulatory Visit: Payer: Self-pay | Admitting: *Deleted

## 2013-05-03 DIAGNOSIS — R5381 Other malaise: Secondary | ICD-10-CM

## 2013-05-03 DIAGNOSIS — R5383 Other fatigue: Principal | ICD-10-CM

## 2013-05-03 DIAGNOSIS — E871 Hypo-osmolality and hyponatremia: Secondary | ICD-10-CM

## 2013-05-03 MED ORDER — COSYNTROPIN 0.25 MG IJ SOLR: 0.2500 mg | Freq: Once | INTRAMUSCULAR | Status: DC

## 2013-05-03 NOTE — Telephone Encounter (Signed)
Dr. Eula Listen office called, when patients labs come in they would like to have her results faxed to them. Fax # [phone]

## 2013-05-05 LAB — CORTISOL (2 SPECIMENS)
Cortisol #1 (Base): 28.3 ug/dL — ABNORMAL HIGH (ref 2.3–19.4)
Cortisol #2: 15.6 ug/dL

## 2013-05-05 NOTE — Progress Notes (Signed)
Quick Note:  Please let patient know that the lab result is normal and no further action needed, send to PCP and Dr. Allyson Sabal ______

## 2013-05-05 NOTE — Telephone Encounter (Signed)
Labs ready

## 2013-05-05 NOTE — Telephone Encounter (Signed)
Faxed to PCP and Dr. Allyson Sabal

## 2013-05-06 ENCOUNTER — Emergency Department: Payer: Self-pay | Admitting: Emergency Medicine

## 2013-05-06 LAB — LIPASE, BLOOD: Lipase: 141 U/L (ref 73–393)

## 2013-05-06 LAB — CBC WITH DIFFERENTIAL/PLATELET
BASOS PCT: 1.1 %
Basophil #: 0.1 10*3/uL (ref 0.0–0.1)
EOS ABS: 0.4 10*3/uL (ref 0.0–0.7)
Eosinophil %: 3.1 %
HCT: 34.1 % — ABNORMAL LOW (ref 35.0–47.0)
HGB: 11.1 g/dL — ABNORMAL LOW (ref 12.0–16.0)
LYMPHS ABS: 2.6 10*3/uL (ref 1.0–3.6)
LYMPHS PCT: 22.3 %
MCH: 27.9 pg (ref 26.0–34.0)
MCHC: 32.5 g/dL (ref 32.0–36.0)
MCV: 86 fL (ref 80–100)
MONO ABS: 0.9 x10 3/mm (ref 0.2–0.9)
MONOS PCT: 8 %
Neutrophil #: 7.6 10*3/uL — ABNORMAL HIGH (ref 1.4–6.5)
Neutrophil %: 65.5 %
Platelet: 225 10*3/uL (ref 150–440)
RBC: 3.97 10*6/uL (ref 3.80–5.20)
RDW: 14.2 % (ref 11.5–14.5)
WBC: 11.6 10*3/uL — ABNORMAL HIGH (ref 3.6–11.0)

## 2013-05-06 LAB — URINALYSIS, COMPLETE
BACTERIA: NONE SEEN
BILIRUBIN, UR: NEGATIVE
BLOOD: NEGATIVE
GLUCOSE, UR: NEGATIVE mg/dL (ref 0–75)
Ketone: NEGATIVE
Leukocyte Esterase: NEGATIVE
NITRITE: NEGATIVE
PH: 8 (ref 4.5–8.0)
Protein: NEGATIVE
RBC, UR: NONE SEEN /HPF (ref 0–5)
SPECIFIC GRAVITY: 1.008 (ref 1.003–1.030)
Squamous Epithelial: <1
WBC UR: <1 /HPF (ref 0–5)

## 2013-05-06 LAB — COMPREHENSIVE METABOLIC PANEL
AST: 21 U/L (ref 15–37)
Albumin: 3.9 g/dL (ref 3.4–5.0)
Alkaline Phosphatase: 64 U/L
Anion Gap: 7 (ref 7–16)
BUN: 12 mg/dL (ref 7–18)
Bilirubin,Total: 0.3 mg/dL (ref 0.2–1.0)
CHLORIDE: 99 mmol/L (ref 98–107)
CO2: 27 mmol/L (ref 21–32)
Calcium, Total: 8.7 mg/dL (ref 8.5–10.1)
Creatinine: 0.63 mg/dL (ref 0.60–1.30)
EGFR (African American): >60
EGFR (Non-African Amer.): >60
Glucose: 125 mg/dL — ABNORMAL HIGH (ref 65–99)
Osmolality: 268 (ref 275–301)
Potassium: 3.6 mmol/L (ref 3.5–5.1)
SGPT (ALT): 17 U/L (ref 12–78)
SODIUM: 133 mmol/L — AB (ref 136–145)
Total Protein: 7.2 g/dL (ref 6.4–8.2)

## 2013-05-06 LAB — TROPONIN I: Troponin-I: <0.02 ng/mL

## 2013-05-10 ENCOUNTER — Ambulatory Visit: Payer: Medicare Other | Admitting: Hematology & Oncology

## 2013-05-10 ENCOUNTER — Telehealth: Payer: Self-pay | Admitting: Internal Medicine

## 2013-05-10 ENCOUNTER — Telehealth: Payer: Self-pay | Admitting: Endocrinology

## 2013-05-10 ENCOUNTER — Other Ambulatory Visit: Payer: Medicare Other | Admitting: Lab

## 2013-05-10 MED ORDER — TIOTROPIUM BROMIDE MONOHYDRATE 18 MCG IN CAPS: 18.0000 ug | ORAL_CAPSULE | Freq: Every day | RESPIRATORY_TRACT | Status: DC

## 2013-05-10 NOTE — Telephone Encounter (Signed)
Spoke with patient-aware that she had 6 month ROV with CY on Friday 05-12-13 and cancelled this. Pt aware that she needed to RSC-requested May 2015 as she has been down in her health and has home health right this time. I made OV in May 2015 and sent enough refills til that appt.

## 2013-05-10 NOTE — Telephone Encounter (Signed)
Pt would like the results from her most recent lab work

## 2013-05-10 NOTE — Telephone Encounter (Signed)
Results given to patient

## 2013-05-12 ENCOUNTER — Ambulatory Visit: Payer: Medicare Other | Admitting: Internal Medicine

## 2013-05-17 ENCOUNTER — Other Ambulatory Visit: Payer: Medicare Other | Admitting: Lab

## 2013-05-17 ENCOUNTER — Ambulatory Visit: Payer: Medicare Other | Admitting: Hematology & Oncology

## 2013-05-18 ENCOUNTER — Other Ambulatory Visit: Payer: Medicare Other | Admitting: Lab

## 2013-05-18 ENCOUNTER — Ambulatory Visit: Payer: Medicare Other | Admitting: Hematology & Oncology

## 2013-05-31 ENCOUNTER — Other Ambulatory Visit: Payer: Self-pay | Admitting: Physical Medicine and Rehabilitation

## 2013-05-31 DIAGNOSIS — M545 Low back pain, unspecified: Secondary | ICD-10-CM

## 2013-05-31 DIAGNOSIS — M7918 Myalgia, other site: Secondary | ICD-10-CM

## 2013-06-09 ENCOUNTER — Ambulatory Visit
Admission: RE | Admit: 2013-06-09 | Discharge: 2013-06-09 | Disposition: A | Discharge status: Home / Self Care | Payer: PRIVATE HEALTH INSURANCE | Source: Ambulatory Visit | Attending: Physical Medicine and Rehabilitation | Admitting: Physical Medicine and Rehabilitation

## 2013-06-09 DIAGNOSIS — M545 Low back pain, unspecified: Secondary | ICD-10-CM

## 2013-06-09 DIAGNOSIS — M7918 Myalgia, other site: Secondary | ICD-10-CM

## 2013-06-26 ENCOUNTER — Ambulatory Visit: Payer: Self-pay | Admitting: Podiatry

## 2013-07-05 ENCOUNTER — Ambulatory Visit: Payer: Medicare Other | Admitting: Internal Medicine

## 2013-07-10 ENCOUNTER — Encounter: Payer: Self-pay | Admitting: Podiatry

## 2013-07-10 ENCOUNTER — Ambulatory Visit (INDEPENDENT_AMBULATORY_CARE_PROVIDER_SITE_OTHER): Payer: 59

## 2013-07-10 ENCOUNTER — Ambulatory Visit (INDEPENDENT_AMBULATORY_CARE_PROVIDER_SITE_OTHER): Payer: 59 | Admitting: Podiatry

## 2013-07-10 VITALS — BP 131/83 | HR 69 | Resp 18 | Ht <= 58 in | Wt 128.0 lb

## 2013-07-10 DIAGNOSIS — M2040 Other hammer toe(s) (acquired), unspecified foot: Secondary | ICD-10-CM

## 2013-07-10 DIAGNOSIS — L608 Other nail disorders: Secondary | ICD-10-CM

## 2013-07-10 DIAGNOSIS — E119 Type 2 diabetes mellitus without complications: Secondary | ICD-10-CM

## 2013-07-10 NOTE — Progress Notes (Signed)
   Subjective:    Patient ID: [name], female    DOB: 07/11/1924, 80 y.o.   MRN: [id number]  HPI Comments: Complaining of her toenails hurting, states she has a fungus and its pushing her toenails up.  She was last to cut them. And her blood sugar this morning was 96 .   Diabetes Hypoglycemia symptoms include nervousness/anxiousness. Associated symptoms include fatigue and weakness.      Review of Systems  Constitutional: Positive for activity change, appetite change and fatigue.  Eyes: Positive for pain.  Respiratory: Positive for shortness of breath.   Cardiovascular: Positive for leg swelling.  Gastrointestinal: Positive for nausea, diarrhea and abdominal distention.  Genitourinary: Positive for difficulty urinating.  Musculoskeletal: Positive for back pain.       Joint pain muscle pain   Skin: Positive for rash.  Neurological: Positive for weakness.  Hematological: Bruises/bleeds easily.       Slow to heal   Psychiatric/Behavioral: The patient is nervous/anxious.        Objective:   Physical Exam: I have reviewed her past medical history medications allergies surgeries social history. Pulses are still palpable bilateral. Deep tendon reflexes are intact bilateral. Orthopedic evaluation demonstrates mild hallux abductovalgus deformity of the left foot. Non-the right foot. Cutaneous evaluation demonstrates supple well hydrated cutis no onychomycosis.        Assessment & Plan:  Assessment: Hallux abductovalgus deformity left normal nail plates bilateral.  Plan: Followup with me as needed.

## 2013-07-13 ENCOUNTER — Emergency Department: Payer: Self-pay | Admitting: Emergency Medicine

## 2013-07-13 LAB — CBC WITH DIFFERENTIAL/PLATELET
BASOS ABS: 0.1 10*3/uL (ref 0.0–0.1)
Basophil %: 0.8 %
Eosinophil #: 0.3 10*3/uL (ref 0.0–0.7)
Eosinophil %: 3.5 %
HCT: 32.9 % — AB (ref 35.0–47.0)
HGB: 10.8 g/dL — ABNORMAL LOW (ref 12.0–16.0)
Lymphocyte #: 2.6 10*3/uL (ref 1.0–3.6)
Lymphocyte %: 28.6 %
MCH: 27.8 pg (ref 26.0–34.0)
MCHC: 33 g/dL (ref 32.0–36.0)
MCV: 84 fL (ref 80–100)
Monocyte #: 0.7 x10 3/mm (ref 0.2–0.9)
Monocyte %: 7.9 %
Neutrophil #: 5.3 10*3/uL (ref 1.4–6.5)
Neutrophil %: 59.2 %
PLATELETS: 310 10*3/uL (ref 150–440)
RBC: 3.9 10*6/uL (ref 3.80–5.20)
RDW: 14.6 % — ABNORMAL HIGH (ref 11.5–14.5)
WBC: 8.9 10*3/uL (ref 3.6–11.0)

## 2013-07-13 LAB — URINALYSIS, COMPLETE
Bacteria: NONE SEEN
Bilirubin,UR: NEGATIVE
Blood: NEGATIVE
Glucose,UR: NEGATIVE mg/dL (ref 0–75)
KETONE: NEGATIVE
Leukocyte Esterase: NEGATIVE
Nitrite: NEGATIVE
PH: 7 (ref 4.5–8.0)
PROTEIN: NEGATIVE
RBC,UR: NONE SEEN /HPF (ref 0–5)
SPECIFIC GRAVITY: 1.004 (ref 1.003–1.030)
Squamous Epithelial: NONE SEEN
WBC UR: <1 /HPF (ref 0–5)

## 2013-07-13 LAB — BASIC METABOLIC PANEL
Anion Gap: 8 (ref 7–16)
BUN: 9 mg/dL (ref 7–18)
CO2: 26 mmol/L (ref 21–32)
Calcium, Total: 8.8 mg/dL (ref 8.5–10.1)
Chloride: 100 mmol/L (ref 98–107)
Creatinine: 0.44 mg/dL — ABNORMAL LOW (ref 0.60–1.30)
Glucose: 91 mg/dL (ref 65–99)
Osmolality: 267 (ref 275–301)
POTASSIUM: 3.5 mmol/L (ref 3.5–5.1)
SODIUM: 134 mmol/L — AB (ref 136–145)

## 2013-07-13 LAB — PRO B NATRIURETIC PEPTIDE: B-Type Natriuretic Peptide: 2859 pg/mL — ABNORMAL HIGH (ref 0–450)

## 2013-07-18 LAB — CULTURE, BLOOD (SINGLE)

## 2013-07-19 ENCOUNTER — Ambulatory Visit: Payer: Medicare Other | Admitting: Internal Medicine

## 2013-08-01 ENCOUNTER — Institutional Professional Consult (permissible substitution): Payer: PRIVATE HEALTH INSURANCE | Admitting: Cardiovascular Disease

## 2013-08-21 ENCOUNTER — Encounter: Payer: Self-pay | Admitting: Cardiovascular Disease

## 2013-08-21 ENCOUNTER — Ambulatory Visit (INDEPENDENT_AMBULATORY_CARE_PROVIDER_SITE_OTHER): Payer: PRIVATE HEALTH INSURANCE | Admitting: Cardiovascular Disease

## 2013-08-21 VITALS — BP 110/70 | HR 67 | Ht <= 58 in | Wt 120.5 lb

## 2013-08-21 DIAGNOSIS — I4891 Unspecified atrial fibrillation: Secondary | ICD-10-CM | POA: Insufficient documentation

## 2013-08-21 DIAGNOSIS — E119 Type 2 diabetes mellitus without complications: Secondary | ICD-10-CM

## 2013-08-21 DIAGNOSIS — W19XXXA Unspecified fall, initial encounter: Secondary | ICD-10-CM

## 2013-08-21 DIAGNOSIS — R0602 Shortness of breath: Secondary | ICD-10-CM

## 2013-08-21 DIAGNOSIS — Z5189 Encounter for other specified aftercare: Secondary | ICD-10-CM

## 2013-08-21 DIAGNOSIS — W19XXXD Unspecified fall, subsequent encounter: Secondary | ICD-10-CM

## 2013-08-21 DIAGNOSIS — I10 Essential (primary) hypertension: Secondary | ICD-10-CM

## 2013-08-21 DIAGNOSIS — R609 Edema, unspecified: Secondary | ICD-10-CM

## 2013-08-21 HISTORY — DX: Unspecified atrial fibrillation: I48.91

## 2013-08-21 MED ORDER — FUROSEMIDE 20 MG PO TABS: 20.0000 mg | ORAL_TABLET | Freq: Every day | ORAL | Status: DC | PRN

## 2013-08-21 MED ORDER — POTASSIUM CHLORIDE ER 10 MEQ PO TBCR: 10.0000 meq | EXTENDED_RELEASE_TABLET | Freq: Every day | ORAL | Status: DC | PRN

## 2013-08-21 NOTE — Assessment & Plan Note (Signed)
Blood pressure is well controlled on today's visit. No changes made to the medications. 

## 2013-08-21 NOTE — Assessment & Plan Note (Signed)
Managed by primary care. 

## 2013-08-21 NOTE — Assessment & Plan Note (Signed)
Long history of falls. Poor candidate for anticoagulation

## 2013-08-21 NOTE — Assessment & Plan Note (Signed)
Recent worsening of her leg edema bilaterally. Today slightly worse on the left. Trace around the ankles, lower shins only. This is likely from new atrial fibrillation and acute diastolic CHF. Recommended she take Lasix Monday Wednesday and Friday. Suggested she hold the Lasix once leg edema resolves and take Lasix when necessary. She will take Lasix with potassium

## 2013-08-21 NOTE — Patient Instructions (Addendum)
You are in atrial fibrillation Please start lasix Monday/Wednesday and Friday with potassium Hold the lasix if no leg swelling  Please call us if you have new issues that need to be addressed before your next appt.  Your physician wants you to follow-up in: 2 weeks

## 2013-08-21 NOTE — Assessment & Plan Note (Addendum)
EKG today showing atrial fibrillation which is new since March 2015. Heart rate relatively well-controlled. She is a poor candidate for anticoagulation. This was discussed with her at length. Arrhythmia likely the cause of her lower extremity edema. Daughter is aware of the risk and complication of anticoagulation. She does not want her mother to be on anticoagulation.

## 2013-08-21 NOTE — Progress Notes (Signed)
Patient ID: [name], female    DOB: [birth year], 80 y.o.   MRN: [id number]  HPI Comments: [name] is a very pleasant 80 year old woman with history HTN, Dm, hypothyroidism, vertigo, anemia, OA, GERD, depression, COPD, prior DVT, DJD and recurrent falls. Negative event monitor back in 2013.   In followup today, she presents with her daughter. Daughter reports that she has had numerous falls. She reports having a pelvic fracture, having worked with physical therapy. She walks with a walker. Treated for urinary tract infection. She also reports having pneumonia approximately one month ago treated with antibiotics.  She does report some slight increase shortness of breath over the past month or so. Has had worsening ankle edema. She was seen in the emergency room and given Lasix for 10 days. This seemed to help her ankle edema but she is out of her Lasix. BNP at that time was 2800, hematocrit 32.9 Chest x-ray 07/13/2013 showing acute bronchitis Appetite is stable. No dramatic weight loss  EKG March 2015 showing normal sinus rhythm EKG today showing atrial fibrillation with ventricular rate 67 beats per minute.     Outpatient Encounter Prescriptions as of 08/21/2013  Medication Sig  . ACCU-CHEK AVIVA PLUS test strip   . ACCU-CHEK SOFTCLIX LANCETS lancets   . acetaminophen (TYLENOL) 325 MG tablet Take 325 mg by mouth every 6 (six) hours as needed.  Marland Kitchen amLODipine (NORVASC) 5 MG tablet Take 1 and 1/2 tablets daily.  Marland Kitchen aspirin 81 MG tablet Take 81 mg by mouth every 3 (three) days.   . clorazepate (TRANXENE) 3.75 MG tablet Take 3.75 mg by mouth at bedtime.   [name] (COLACE PO) Take by mouth every morning.  Marland Kitchen esomeprazole (NEXIUM) 40 MG capsule Take 40 mg by mouth daily.   . fluconazole (DIFLUCAN) 150 MG tablet Take 150 mg by mouth daily as needed.  . fluocinonide cream (LIDEX) 7.06 % Apply 1 application topically 3 (three) times daily.   . hydrOXYzine (ATARAX/VISTARIL) 10 MG  tablet Take 10 mg by mouth every 4 (four) hours as needed.  . Ibuprofen (ADVIL) 200 MG CAPS Take 200 mg by mouth daily as needed.   . indomethacin (INDOCIN SR) 75 MG CR capsule Take 75 mg by mouth daily.   Marland Kitchen levothyroxine (SYNTHROID, LEVOTHROID) 75 MCG tablet Take 75 mcg by mouth daily.    . metFORMIN (GLUCOPHAGE) 500 MG tablet Take 500 mg by mouth 2 (two) times daily with a meal.  . metoprolol succinate (TOPROL XL) 100 MG 24 hr tablet Take 1 tablet (100 mg total) by mouth daily.  . Multiple Vitamin (MULTIVITAMIN PO) Take 1 tablet by mouth daily.    . Omega-3 Fatty Acids (FISH OIL) 500 MG CAPS Take 500 mg by mouth once a week.  . ondansetron (ZOFRAN) 4 MG tablet Take 4 mg by mouth every 8 (eight) hours as needed for nausea or vomiting.  Marland Kitchen oxyCODONE-acetaminophen (PERCOCET) 7.5-325 MG per tablet Take 1/2 tablet twice a day as needed for pain.  [name] (SYSTANE) 0.4-0.3 % SOLN Apply to eye as needed.  . ranitidine (ZANTAC) 75 MG tablet Take 75 mg by mouth daily as needed.   . sertraline (ZOLOFT) 50 MG tablet Take 50 mg by mouth daily.    Marland Kitchen tiotropium (SPIRIVA) 18 MCG inhalation capsule Place 1 capsule (18 mcg total) into inhaler and inhale daily.  . furosemide (LASIX) 20 MG tablet Take 1 tablet (20 mg total) by mouth daily as needed.  Marland Kitchen  potassium chloride (K-DUR) 10 MEQ tablet Take 1 tablet (10 mEq total) by mouth daily as needed.    Review of Systems  Constitutional: Negative.   HENT: Negative.   Eyes: Negative.   Respiratory: Negative.   Cardiovascular: Positive for leg swelling.  Gastrointestinal: Negative.   Endocrine: Negative.   Musculoskeletal: Positive for gait problem.  Skin: Negative.   Allergic/Immunologic: Negative.   Neurological: Negative.   Hematological: Negative.   Psychiatric/Behavioral: Negative.   All other systems reviewed and are negative.   BP 110/70  Pulse 67  Ht 4\' 9"  (1.448 m)  Wt 120 lb 8 oz (54.658 kg)  BMI 26.07  kg/m2  Physical Exam  Nursing note and vitals reviewed. Constitutional: She is oriented to person, place, and time. She appears well-developed and well-nourished.  HENT:  Head: Normocephalic.  Nose: Nose normal.  Mouth/Throat: Oropharynx is clear and moist.  Eyes: Conjunctivae are normal. Pupils are equal, round, and reactive to light.  Neck: Normal range of motion. Neck supple. No JVD present.  Cardiovascular: Normal rate, regular rhythm, S1 normal, S2 normal, normal heart sounds and intact distal pulses.  Exam reveals no gallop and no friction rub.   No murmur heard. Pulmonary/Chest: Effort normal and breath sounds normal. No respiratory distress. She has no wheezes. She has no rales. She exhibits no tenderness.  Abdominal: Soft. Bowel sounds are normal. She exhibits no distension. There is no tenderness.  Musculoskeletal: Normal range of motion. She exhibits no edema and no tenderness.  Lymphadenopathy:    She has no cervical adenopathy.  Neurological: She is alert and oriented to person, place, and time. Coordination normal.  Skin: Skin is warm and dry. No rash noted. No erythema.  Psychiatric: She has a normal mood and affect. Her behavior is normal. Judgment and thought content normal.    Assessment and Plan

## 2013-08-23 ENCOUNTER — Telehealth: Payer: Self-pay | Admitting: *Deleted

## 2013-08-23 NOTE — Telephone Encounter (Signed)
Patient called and she is still swelling in her feet and ankles. Please call.

## 2013-08-23 NOTE — Telephone Encounter (Signed)
Spoke w/ pt.  She reports that her feet and legs are swelling, worse than they were are at ov on 6/22.  Pt states that she would like name brand Lasix, as furosemide is not working for her.  Advised that they she can request name brand or generic at her pharmacy.  On discussion, pt reports eating [employer] quite regularly, but had a misunderstanding of sodium, and has only been monitoring her calories.  She reports that she has been drinking a Sprite each day, as it has sodium in it and she was under the impression that this helped her pee.  Had lengthy discussion w/ pt on what types of foods and drinks to avoid.   Advised pt to wear compression hose and keep feet elevated.  She is agreeable and will call w/ further questions or concerns.

## 2013-08-30 ENCOUNTER — Ambulatory Visit: Payer: Medicare Other | Admitting: Internal Medicine

## 2013-09-11 ENCOUNTER — Ambulatory Visit (INDEPENDENT_AMBULATORY_CARE_PROVIDER_SITE_OTHER): Payer: PRIVATE HEALTH INSURANCE | Admitting: Cardiovascular Disease

## 2013-09-11 ENCOUNTER — Encounter: Payer: Self-pay | Admitting: Cardiovascular Disease

## 2013-09-11 VITALS — BP 118/60 | HR 74 | Ht <= 58 in | Wt 119.5 lb

## 2013-09-11 DIAGNOSIS — R2243 Localized swelling, mass and lump, lower limb, bilateral: Secondary | ICD-10-CM

## 2013-09-11 DIAGNOSIS — R600 Localized edema: Secondary | ICD-10-CM

## 2013-09-11 DIAGNOSIS — M7989 Other specified soft tissue disorders: Secondary | ICD-10-CM

## 2013-09-11 DIAGNOSIS — F411 Generalized anxiety disorder: Secondary | ICD-10-CM

## 2013-09-11 DIAGNOSIS — I4891 Unspecified atrial fibrillation: Secondary | ICD-10-CM

## 2013-09-11 DIAGNOSIS — I10 Essential (primary) hypertension: Secondary | ICD-10-CM

## 2013-09-11 DIAGNOSIS — E119 Type 2 diabetes mellitus without complications: Secondary | ICD-10-CM

## 2013-09-11 DIAGNOSIS — R609 Edema, unspecified: Secondary | ICD-10-CM

## 2013-09-11 MED ORDER — FUROSEMIDE 20 MG PO TABS: 20.0000 mg | ORAL_TABLET | Freq: Two times a day (BID) | ORAL | Status: DC | PRN

## 2013-09-11 NOTE — Assessment & Plan Note (Signed)
Managed by primary care. On metformin.

## 2013-09-11 NOTE — Assessment & Plan Note (Signed)
Localized left ankle swelling, minimal on the right. She is very bothered by this trace edema. We have suggested she decrease her amlodipine down to 5 mg daily as this could be contributing to her symptoms. Also suggested she increase Lasix up to 30 mg or 40 mg daily. She does have significant fluid intake. Basic metabolic panel to be done at her convenience if she has any significant weight loss. Tried to reassure her that she is doing well and edema is minimal. Likely a result for atrial fibrillation, possibly small contribution from her amlodipine.

## 2013-09-11 NOTE — Assessment & Plan Note (Signed)
Blood pressure was adequate today. Given her lower extremity edema which is troubling her, we would decrease the amlodipine down to 5 mg daily. We have suggested if edema continues, we could decrease the dose even further and start an alternate blood pressure pill such as losartan or lisinopril.

## 2013-09-11 NOTE — Assessment & Plan Note (Signed)
We tried to comfort her and expressed how well she is doing. She seems anxious overall. Long discussion about her medical issues.

## 2013-09-11 NOTE — Patient Instructions (Addendum)
You are doing well.  Please decrease the amlodipine down to one a day Monitor your blood pressure  Please increase the lasix to 1 1/2 a day with potassium Watch your fluid intake  If you would like blood work to check your kidney, come in at any time (let the nurses know you are coming)  Please call us if you have new issues that need to be addressed before your next appt.  Your physician wants you to follow-up in: 1 month.

## 2013-09-11 NOTE — Progress Notes (Signed)
Patient ID: [name], female    DOB: [birth year], 80 y.o.   MRN: [id number]  HPI Comments: [name] is a very pleasant 80 year old woman with history of HTN, Dm, hypothyroidism, vertigo, anemia, OA, GERD, depression, COPD, prior DVT, DJD and recurrent falls. Negative event monitor back in 2013. She presents for routine followup   she presents with her daughter. numerous falls in the past, none since her prior clinic visit. Prior pelvic fracture after a fall, worked with physical therapy. She walks with a walker. Treated for urinary tract infection.   In followup today, she reports that she is bothered by her lower extremity edema. Edema is worse on the left ankle, minimal on the right. Denies having any shortness of breath, no PND or orthopnea. She has been taking furosemide 20 mg daily but reports that it did not seem to work. She requested the brand name Lasix 20 mg daily and this did not seem to work. She states having a "clear" pill in the hospital at work the best and she would like this pill again. She denies having any significant palpitations. On Lasix 20 mg daily, 1 pound weight loss from her prior clinic visit. She does drink significant fluids in the daytime.  Lab work on prior trip to the emergency room. BNP  2800, hematocrit 32.9 Normal renal function may 2015 Chest x-ray 07/13/2013 showing acute bronchitis  EKG today showing atrial fibrillation with ventricular rate 74 beats per minute.     Outpatient Encounter Prescriptions as of 09/11/2013  Medication Sig  . ACCU-CHEK AVIVA PLUS test strip   . ACCU-CHEK SOFTCLIX LANCETS lancets   . acetaminophen (TYLENOL) 325 MG tablet Take 325 mg by mouth every 6 (six) hours as needed.  Marland Kitchen amLODipine (NORVASC) 5 MG tablet Take 1 and 1/2 tablets daily.  Marland Kitchen aspirin 81 MG tablet Take 81 mg by mouth every 3 (three) days.   . clorazepate (TRANXENE) 3.75 MG tablet Take 3.75 mg by mouth at bedtime.   [name] Sodium (COLACE PO) Take by  mouth every morning.  Marland Kitchen esomeprazole (NEXIUM) 40 MG capsule Take 40 mg by mouth daily.   . fluconazole (DIFLUCAN) 150 MG tablet Take 150 mg by mouth daily as needed.  . fluocinonide cream (LIDEX) 2.99 % Apply 1 application topically 3 (three) times daily.   . furosemide (LASIX) 20 MG tablet Take 1 tablet (20 mg total) by mouth daily as needed.  . hydrOXYzine (ATARAX/VISTARIL) 10 MG tablet Take 10 mg by mouth every 4 (four) hours as needed.  . Ibuprofen (ADVIL) 200 MG CAPS Take 200 mg by mouth daily as needed.   . indomethacin (INDOCIN SR) 75 MG CR capsule Take 75 mg by mouth daily.   Marland Kitchen levothyroxine (SYNTHROID, LEVOTHROID) 75 MCG tablet Take 75 mcg by mouth daily.    . metFORMIN (GLUCOPHAGE) 500 MG tablet Take 500 mg by mouth 2 (two) times daily with a meal.  . Multiple Vitamin (MULTIVITAMIN PO) Take 1 tablet by mouth daily.    . Omega-3 Fatty Acids (FISH OIL) 500 MG CAPS Take 500 mg by mouth once a week.  . ondansetron (ZOFRAN) 4 MG tablet Take 4 mg by mouth every 8 (eight) hours as needed for nausea or vomiting.  Marland Kitchen oxyCODONE-acetaminophen (PERCOCET) 7.5-325 MG per tablet Take 1/2 tablet twice a day as needed for pain.  [name] Glycol-Propyl Glycol (SYSTANE) 0.4-0.3 % SOLN Apply to eye as needed.  . potassium chloride (K-DUR) 10 MEQ tablet Take 1 tablet (  10 mEq total) by mouth daily as needed.  . ranitidine (ZANTAC) 75 MG tablet Take 75 mg by mouth daily as needed.   . sertraline (ZOLOFT) 50 MG tablet Take 50 mg by mouth daily.    Marland Kitchen tiotropium (SPIRIVA) 18 MCG inhalation capsule Place 1 capsule (18 mcg total) into inhaler and inhale daily.  . metoprolol succinate (TOPROL XL) 100 MG 24 hr tablet Take 1 tablet (100 mg total) by mouth daily.    Review of Systems  Constitutional: Negative.   HENT: Negative.   Eyes: Negative.   Respiratory: Negative.   Cardiovascular: Positive for leg swelling.  Gastrointestinal: Negative.   Endocrine: Negative.   Musculoskeletal: Positive for gait  problem.  Skin: Negative.   Allergic/Immunologic: Negative.   Neurological: Negative.   Hematological: Negative.   Psychiatric/Behavioral: Negative.   All other systems reviewed and are negative.   BP 118/60  Pulse 74  Ht 4\' 9"  (1.448 m)  Wt 119 lb 8 oz (54.205 kg)  BMI 25.85 kg/m2  Physical Exam  Nursing note and vitals reviewed. Constitutional: She is oriented to person, place, and time. She appears well-developed and well-nourished.  HENT:  Head: Normocephalic.  Nose: Nose normal.  Mouth/Throat: Oropharynx is clear and moist.  Eyes: Conjunctivae are normal. Pupils are equal, round, and reactive to light.  Neck: Normal range of motion. Neck supple. No JVD present.  Cardiovascular: Normal rate, regular rhythm, S1 normal, S2 normal, normal heart sounds and intact distal pulses.  Exam reveals no gallop and no friction rub.   No murmur heard. Pulmonary/Chest: Effort normal and breath sounds normal. No respiratory distress. She has no wheezes. She has no rales. She exhibits no tenderness.  Abdominal: Soft. Bowel sounds are normal. She exhibits no distension. There is no tenderness.  Musculoskeletal: Normal range of motion. She exhibits no edema and no tenderness.  Lymphadenopathy:    She has no cervical adenopathy.  Neurological: She is alert and oriented to person, place, and time. Coordination normal.  Skin: Skin is warm and dry. No rash noted. No erythema.  Psychiatric: She has a normal mood and affect. Her behavior is normal. Judgment and thought content normal.    Assessment and Plan

## 2013-09-11 NOTE — Assessment & Plan Note (Signed)
Heart rate relatively well-controlled.  poor candidate for anticoagulation given her history of falls. She has significant bruising on her arms.  Daughter is aware of the risk and complication of anticoagulation. She does not want her mother to be on anticoagulation.

## 2013-10-12 ENCOUNTER — Ambulatory Visit (INDEPENDENT_AMBULATORY_CARE_PROVIDER_SITE_OTHER): Payer: Medicare Other | Admitting: Internal Medicine

## 2013-10-12 ENCOUNTER — Encounter: Payer: Self-pay | Admitting: Internal Medicine

## 2013-10-12 VITALS — BP 120/72 | HR 69 | Ht <= 58 in | Wt 121.4 lb

## 2013-10-12 DIAGNOSIS — I482 Chronic atrial fibrillation, unspecified: Secondary | ICD-10-CM

## 2013-10-12 DIAGNOSIS — I4891 Unspecified atrial fibrillation: Secondary | ICD-10-CM

## 2013-10-12 DIAGNOSIS — J449 Chronic obstructive pulmonary disease, unspecified: Secondary | ICD-10-CM

## 2013-10-12 MED ORDER — TIOTROPIUM BROMIDE MONOHYDRATE 18 MCG IN CAPS: 18.0000 ug | ORAL_CAPSULE | Freq: Every day | RESPIRATORY_TRACT | Status: DC

## 2013-10-12 NOTE — Assessment & Plan Note (Signed)
Controlled without recent exacerbation. Complicated by background history of congestive heart failure. I am not doing enough for her to be worth travel in to [place] to see me . I explained that her primary physician could certainly refill her Spiriva and address her breathing concerns.. I will be happy to see her again as needed

## 2013-10-12 NOTE — Progress Notes (Signed)
09/09/12- 17 yoF never smoker  self-referral to re-establish (last ov 06/2009).   here with a caregiver "Debbie" Reports a hacking cough, mostly at night.  using Spiriva daily She had pneumonia several times, starting at age 79. Husband smoked, representing secondhand smoke exposure. She describes shortness of breath especially with exertion on longer walks, dry cough. Denies exertional chest pain but does snore. No wheezing. Her breathing does not wake her. She has history of cardiomegaly and hypertension, diabetes. Latex allergy. Some history of seasonal allergic rhinitis and asthma. Widowed, retired from school system. A brother with emphysema. She has been using Spiriva. CXR 07/02/09 IMPRESSION:  Stable chronic lung disease. No acute cardiopulmonary process.  Provider: Lorina Rabon  11/07/12- 87 yoF never smoker followed for dyspnea, chronic bronchitis   Debbie, caregiver, is here FOLLOWS FOR: pt reports breathing is a little worse, not taking spiriva regularly -- states cough is not as bad as during last OV Skips Spiriva at times when she doesn't think she needs it. Little cough CXR 09/09/12  IMPRESSION:  Cardiomegaly and chronic lower lobe predominant scarring. No acute  superimposed process.  Original Report Authenticated By: Abigail Miyamoto, M.D.  10/12/13- 48 yoF never smoker followed for dyspnea, chronic bronchitis   Debbie, caregiver, is here FOLLOWS FOR: Had PNA Jan/Feb 2015(treated at home with abx); dx'd with CHF since last visit.  Shows rash on her arms attributed to Ceftin and says she is scheduled to see a dermatologist. She reports generalized malaise, falls, family stress. Admits only occasional shortness of breath, dyspnea on exertion making her bed, scant morning phlegm. Using only Spiriva which she says is sufficient.  ROS-see HPI Constitutional:   No-   weight loss, night sweats, fevers, chills, fatigue, lassitude. HEENT:   No-  headaches, difficulty swallowing, tooth/dental  problems, sore throat,       No-  sneezing, itching, ear ache, +nasal congestion, post nasal drip,  CV:  No-   chest pain, orthopnea, PND, swelling in lower extremities, anasarca, dizziness, palpitations Resp: +  shortness of breath with exertion or at rest.              No-  productive cough,  No non-productive cough,  No- coughing up of blood.              No-   change in color of mucus.  No- wheezing.   Skin: No-   rash or lesions. GI:  + heartburn, indigestion, abdominal pain, no-nausea, vomiting,  GU:  MS:  N+ joint pain or swelling.   Neuro-     nothing unusual Psych:  No- change in mood or affect. + depression or anxiety.  No memory loss.  OBJ- Physical Exam General- Alert, Oriented, Affect-appropriate, Distress- none acute, frail elderly lady Skin- rash-none, lesions- none, excoriation- none Lymphadenopathy- none Head- atraumatic            Eyes- Gross vision intact, PERRLA, conjunctivae and secretions clear            Ears- Hearing, canals-normal            Nose- Clear, no-Septal dev, mucus, polyps, erosion, perforation             Throat- Mallampati II , mucosa clear , drainage- none, tonsils- atrophic Neck- flexible , trachea midline, no stridor , thyroid nl, carotid no bruit Chest - symmetrical excursion , unlabored           Heart/CV- RRR/ occasional skip , no murmur , no gallop  ,  no rub, nl s1 s2                           - JVD- none , edema+ trace, stasis changes- none, varices- none           Lung- clear, wheeze- none, cough- none , dullness-none, rub- none           Chest wall-  Abd-  Br/ Gen/ Rectal- Not done, not indicated Extrem- cyanosis- none, clubbing, none, atrophy- none, strength- nl Neuro- grossly intact to observation

## 2013-10-12 NOTE — Assessment & Plan Note (Signed)
Managed by cardiology 

## 2013-10-12 NOTE — Patient Instructions (Signed)
Script for Spiriva sent to [facility]  Dr Kary Kos can follow you, and he can treat your breathing problems. We will be happy to see you here again in the future if needed.

## 2013-10-13 ENCOUNTER — Ambulatory Visit (INDEPENDENT_AMBULATORY_CARE_PROVIDER_SITE_OTHER): Payer: Medicare Other | Admitting: Cardiovascular Disease

## 2013-10-13 ENCOUNTER — Encounter: Payer: Self-pay | Admitting: Cardiovascular Disease

## 2013-10-13 VITALS — BP 108/78 | HR 71 | Ht <= 58 in | Wt 121.5 lb

## 2013-10-13 DIAGNOSIS — T7589XS Other specified effects of external causes, sequela: Secondary | ICD-10-CM

## 2013-10-13 DIAGNOSIS — R2243 Localized swelling, mass and lump, lower limb, bilateral: Secondary | ICD-10-CM

## 2013-10-13 DIAGNOSIS — M7989 Other specified soft tissue disorders: Secondary | ICD-10-CM

## 2013-10-13 DIAGNOSIS — W19XXXS Unspecified fall, sequela: Secondary | ICD-10-CM

## 2013-10-13 DIAGNOSIS — I10 Essential (primary) hypertension: Secondary | ICD-10-CM

## 2013-10-13 DIAGNOSIS — I4891 Unspecified atrial fibrillation: Secondary | ICD-10-CM

## 2013-10-13 DIAGNOSIS — R0602 Shortness of breath: Secondary | ICD-10-CM

## 2013-10-13 MED ORDER — FUROSEMIDE 20 MG PO TABS: 30.0000 mg | ORAL_TABLET | Freq: Two times a day (BID) | ORAL | Status: DC | PRN

## 2013-10-13 NOTE — Assessment & Plan Note (Signed)
We have suggested she hold her amlodipine. Also recommended she continue on Lasix 30 mg every morning. Take extra Lasix 30 mg after lunch for ankle edema. We will recheck her basic metabolic panel for repeat potassium

## 2013-10-13 NOTE — Assessment & Plan Note (Signed)
Heart rate is well controlled. She is a poor candidate for anticoagulation given her frequent falls. Significant bruising on her arms and very frail skin

## 2013-10-13 NOTE — Assessment & Plan Note (Signed)
Blood pressures running low. We will hold her amlodipine especially in light of her leg swelling

## 2013-10-13 NOTE — Progress Notes (Signed)
Patient ID: [name], female    DOB: 04/09/1924, 80 y.o.   MRN: [id number]  HPI Comments: [name] is an 80 year old woman with history of HTN, Dm, hypothyroidism, vertigo, anemia, OA, GERD, depression, COPD, prior DVT, DJD and recurrent falls. Negative event monitor back in 2013. She presents for routine followup Recently diagnosed atrial fibrillation Patient of Dr. Kary Kos numerous falls in the past,  Prior pelvic fracture after a fall  In followup today, she reports that she is bothered by her lower extremity edema. Edema is now in both ankles, worse today. She is taking Lasix 30 mg daily. Based on her last clinic visit, she has decreased her amlodipine dose down to 5 mg daily She denies having any significant palpitations. She does drink significant fluids in the daytime. She takes potassium 30 mEq per day  Recent imaging was reviewed April 2015 MRI lumbar spine showing sacral fractures, scoliosis, spondylosis CT chest and abdomen January 2015 showing remote rib fractures, old healed fracture of the manubrium, DJD, pubic ramus fracture on the left  Lab work showing creatinine 0.56, potassium 3.2  Lab work on prior trip to the emergency room. BNP  2800, hematocrit 32.9 Normal renal function may 2015 Chest x-ray 07/13/2013 showing acute bronchitis  EKG today showing atrial fibrillation with ventricular rate 71 beats per minute.     Outpatient Encounter Prescriptions as of 10/13/1976  Medication Sig  . ACCU-CHEK AVIVA PLUS test strip   . ACCU-CHEK SOFTCLIX LANCETS lancets   . acetaminophen (TYLENOL) 325 MG tablet Take 325 mg by mouth every 6 (six) hours as needed.  Marland Kitchen amLODipine (NORVASC) 5 MG tablet Take 1 and 1/2 tablets daily.  Marland Kitchen aspirin 81 MG tablet Take 81 mg by mouth every 3 (three) days.   . clorazepate (TRANXENE) 3.75 MG tablet Take 3.75 mg by mouth at bedtime.   [name] (COLACE PO) Take by mouth every morning.  Marland Kitchen esomeprazole (NEXIUM) 40 MG capsule Take  40 mg by mouth daily.   . fluocinonide cream (LIDEX) 1.88 % Apply 1 application topically 3 (three) times daily.   . furosemide (LASIX) 20 MG tablet Take 1 tablet (20 mg total) by mouth 2 (two) times daily as needed.  . hydrOXYzine (ATARAX/VISTARIL) 10 MG tablet Take 10 mg by mouth every 4 (four) hours as needed.  . Ibuprofen (ADVIL) 200 MG CAPS Take 200 mg by mouth daily as needed.   Marland Kitchen levothyroxine (SYNTHROID, LEVOTHROID) 75 MCG tablet Take 75 mcg by mouth daily.    . metFORMIN (GLUCOPHAGE) 500 MG tablet Take 500 mg by mouth 2 (two) times daily with a meal.  . metoprolol succinate (TOPROL XL) 100 MG 24 hr tablet Take 1 tablet (100 mg total) by mouth daily.  . Multiple Vitamin (MULTIVITAMIN PO) Take 1 tablet by mouth daily.    [name] (SYSTANE) 0.4-0.3 % SOLN Apply to eye as needed.  . potassium chloride (K-DUR) 10 MEQ tablet Take 1 tablet (10 mEq total) by mouth daily as needed.  . ranitidine (ZANTAC) 75 MG tablet Take 75 mg by mouth daily as needed.   . sertraline (ZOLOFT) 50 MG tablet Take 50 mg by mouth daily.    Marland Kitchen tiotropium (SPIRIVA) 18 MCG inhalation capsule Place 1 capsule (18 mcg total) into inhaler and inhale daily.    Review of Systems  Constitutional: Negative.   HENT: Negative.   Eyes: Negative.   Respiratory: Negative.   Cardiovascular: Positive for leg swelling.  Gastrointestinal: Negative.  Endocrine: Negative.   Musculoskeletal: Positive for gait problem.  Skin: Negative.   Allergic/Immunologic: Negative.   Neurological: Negative.   Hematological: Negative.   Psychiatric/Behavioral: Negative.   All other systems reviewed and are negative.   BP 108/78  Pulse 71  Ht 4\' 9"  (1.448 m)  Wt 121 lb 8 oz (55.112 kg)  BMI 26.29 kg/m2  Physical Exam  Nursing note and vitals reviewed. Constitutional: She is oriented to person, place, and time. She appears well-developed and well-nourished.  HENT:  Head: Normocephalic.  Nose: Nose normal.   Mouth/Throat: Oropharynx is clear and moist.  Eyes: Conjunctivae are normal. Pupils are equal, round, and reactive to light.  Neck: Normal range of motion. Neck supple. No JVD present.  Cardiovascular: Normal rate, regular rhythm, S1 normal, S2 normal, normal heart sounds and intact distal pulses.  Exam reveals no gallop and no friction rub.   No murmur heard. Pulmonary/Chest: Effort normal and breath sounds normal. No respiratory distress. She has no wheezes. She has no rales. She exhibits no tenderness.  Abdominal: Soft. Bowel sounds are normal. She exhibits no distension. There is no tenderness.  Musculoskeletal: Normal range of motion. She exhibits no edema and no tenderness.  Lymphadenopathy:    She has no cervical adenopathy.  Neurological: She is alert and oriented to person, place, and time. Coordination normal.  Skin: Skin is warm and dry. No rash noted. No erythema.  Psychiatric: She has a normal mood and affect. Her behavior is normal. Judgment and thought content normal.    Assessment and Plan

## 2013-10-13 NOTE — Assessment & Plan Note (Signed)
Shortness of breath likely from atrial fibrillation and acute on chronic diastolic CHF. Now with worsening edema. We will add extra Lasix after lunch on days when she has leg edema

## 2013-10-13 NOTE — Patient Instructions (Addendum)
Please hold the amlodipine (blood pressure is low)   Please take lasix 30 mg every morning  If you have ankle swelling, Please take additional lasix 30 mg after lunch  Please call us if you have new issues that need to be addressed before your next appt.  Your physician wants you to follow-up in: 3 months.

## 2013-10-13 NOTE — Assessment & Plan Note (Signed)
She has provided [place] with significant details and scan images   today that document diffuse fractures of the sternum, sacrum, ribs

## 2013-10-14 LAB — BASIC METABOLIC PANEL
BUN / CREAT RATIO: 20 (ref 11–26)
BUN: 17 mg/dL (ref 8–27)
CO2: 24 mmol/L (ref 18–29)
Calcium: 9.3 mg/dL (ref 8.7–10.3)
Chloride: 95 mmol/L — ABNORMAL LOW (ref 97–108)
Creatinine, Ser: 0.87 mg/dL (ref 0.57–1.00)
GFR, EST AFRICAN AMERICAN: 70 mL/min/{1.73_m2} (ref >59)
GFR, EST NON AFRICAN AMERICAN: 61 mL/min/{1.73_m2} (ref >59)
Glucose: 78 mg/dL (ref 65–99)
POTASSIUM: 4.1 mmol/L (ref 3.5–5.2)
SODIUM: 136 mmol/L (ref 134–144)

## 2013-10-24 ENCOUNTER — Inpatient Hospital Stay: Payer: Self-pay | Admitting: Internal Medicine

## 2013-10-24 LAB — COMPREHENSIVE METABOLIC PANEL
ALBUMIN: 3.7 g/dL (ref 3.4–5.0)
Alkaline Phosphatase: 68 U/L
Anion Gap: 9 (ref 7–16)
BILIRUBIN TOTAL: 0.6 mg/dL (ref 0.2–1.0)
BUN: 14 mg/dL (ref 7–18)
CHLORIDE: 99 mmol/L (ref 98–107)
Calcium, Total: 8.9 mg/dL (ref 8.5–10.1)
Co2: 28 mmol/L (ref 21–32)
Creatinine: 0.8 mg/dL (ref 0.60–1.30)
EGFR (Non-African Amer.): >60
Glucose: 118 mg/dL — ABNORMAL HIGH (ref 65–99)
Osmolality: 274 (ref 275–301)
POTASSIUM: 3.7 mmol/L (ref 3.5–5.1)
SGOT(AST): 23 U/L (ref 15–37)
SGPT (ALT): 32 U/L
SODIUM: 136 mmol/L (ref 136–145)
TOTAL PROTEIN: 6.7 g/dL (ref 6.4–8.2)

## 2013-10-24 LAB — CBC
HCT: 37.8 % (ref 35.0–47.0)
HGB: 12.1 g/dL (ref 12.0–16.0)
MCH: 26.2 pg (ref 26.0–34.0)
MCHC: 31.9 g/dL — AB (ref 32.0–36.0)
MCV: 82 fL (ref 80–100)
Platelet: 225 10*3/uL (ref 150–440)
RBC: 4.59 10*6/uL (ref 3.80–5.20)
RDW: 16.8 % — ABNORMAL HIGH (ref 11.5–14.5)
WBC: 10.2 10*3/uL (ref 3.6–11.0)

## 2013-10-24 LAB — URINALYSIS, COMPLETE
BILIRUBIN, UR: NEGATIVE
Bacteria: NONE SEEN
Blood: NEGATIVE
Glucose,UR: NEGATIVE mg/dL (ref 0–75)
Ketone: NEGATIVE
Leukocyte Esterase: NEGATIVE
Nitrite: NEGATIVE
PH: 7 (ref 4.5–8.0)
PROTEIN: NEGATIVE
RBC,UR: <1 /HPF (ref 0–5)
Specific Gravity: 1.01 (ref 1.003–1.030)
Squamous Epithelial: NONE SEEN

## 2013-10-25 LAB — CBC WITH DIFFERENTIAL/PLATELET
Basophil #: 0.1 10*3/uL (ref 0.0–0.1)
Basophil %: 0.5 %
EOS ABS: 0.2 10*3/uL (ref 0.0–0.7)
Eosinophil %: 2 %
HCT: 33.3 % — ABNORMAL LOW (ref 35.0–47.0)
HGB: 11.2 g/dL — ABNORMAL LOW (ref 12.0–16.0)
LYMPHS ABS: 1.6 10*3/uL (ref 1.0–3.6)
Lymphocyte %: 15.3 %
MCH: 27.3 pg (ref 26.0–34.0)
MCHC: 33.5 g/dL (ref 32.0–36.0)
MCV: 82 fL (ref 80–100)
Monocyte #: 1 x10 3/mm — ABNORMAL HIGH (ref 0.2–0.9)
Monocyte %: 9.5 %
Neutrophil #: 7.5 10*3/uL — ABNORMAL HIGH (ref 1.4–6.5)
Neutrophil %: 72.7 %
Platelet: 184 10*3/uL (ref 150–440)
RBC: 4.09 10*6/uL (ref 3.80–5.20)
RDW: 16.8 % — ABNORMAL HIGH (ref 11.5–14.5)
WBC: 10.3 10*3/uL (ref 3.6–11.0)

## 2013-10-25 LAB — BASIC METABOLIC PANEL
Anion Gap: 10 (ref 7–16)
BUN: 16 mg/dL (ref 7–18)
Calcium, Total: 8.2 mg/dL — ABNORMAL LOW (ref 8.5–10.1)
Chloride: 101 mmol/L (ref 98–107)
Co2: 24 mmol/L (ref 21–32)
Creatinine: 0.82 mg/dL (ref 0.60–1.30)
Glucose: 121 mg/dL — ABNORMAL HIGH (ref 65–99)
OSMOLALITY: 273 (ref 275–301)
Potassium: 3.8 mmol/L (ref 3.5–5.1)
SODIUM: 135 mmol/L — AB (ref 136–145)

## 2013-10-25 LAB — HEMOGLOBIN A1C: HEMOGLOBIN A1C: 6.4 % — AB (ref 4.2–6.3)

## 2013-10-26 ENCOUNTER — Telehealth: Payer: Self-pay

## 2013-10-26 LAB — HEMOGLOBIN: HGB: 10.8 g/dL — ABNORMAL LOW (ref 12.0–16.0)

## 2013-10-26 NOTE — Telephone Encounter (Signed)
Spoke w/ pt's daughter.  She reports that [name] is currently in [facility] s/p fall, reports that she broke her hip and her shoulder.  She states that she advised the hospital staff several times that pt sees Dr. Rockey Situ, but Dr. Humphrey Rolls has been seeing pt. She would like to make Dr. Rockey Situ aware. She reports that pt will be d/c'd to [facility] for rehab and will be unable to go to any appts for awhile, but will ensure that any follow up appts are sched w/ Dr. Rockey Situ.

## 2013-10-28 NOTE — Telephone Encounter (Signed)
Would let daughter know we are looking into it Looks like lots of mistakes made on 1A,  They did not call the right person for consult I am talking with admin in the hospital

## 2013-10-30 ENCOUNTER — Non-Acute Institutional Stay (SKILLED_NURSING_FACILITY): Payer: 59 | Admitting: Adult Health

## 2013-10-30 ENCOUNTER — Other Ambulatory Visit: Payer: Self-pay | Admitting: *Deleted

## 2013-10-30 ENCOUNTER — Encounter: Payer: Self-pay | Admitting: Adult Health

## 2013-10-30 DIAGNOSIS — IMO0002 Reserved for concepts with insufficient information to code with codable children: Secondary | ICD-10-CM | POA: Insufficient documentation

## 2013-10-30 DIAGNOSIS — I509 Heart failure, unspecified: Secondary | ICD-10-CM

## 2013-10-30 DIAGNOSIS — T7589XS Other specified effects of external causes, sequela: Secondary | ICD-10-CM

## 2013-10-30 DIAGNOSIS — E1165 Type 2 diabetes mellitus with hyperglycemia: Secondary | ICD-10-CM

## 2013-10-30 DIAGNOSIS — S42301S Unspecified fracture of shaft of humerus, right arm, sequela: Secondary | ICD-10-CM

## 2013-10-30 DIAGNOSIS — S72141S Displaced intertrochanteric fracture of right femur, sequela: Secondary | ICD-10-CM

## 2013-10-30 DIAGNOSIS — S72141A Displaced intertrochanteric fracture of right femur, initial encounter for closed fracture: Secondary | ICD-10-CM | POA: Insufficient documentation

## 2013-10-30 DIAGNOSIS — S72009S Fracture of unspecified part of neck of unspecified femur, sequela: Secondary | ICD-10-CM

## 2013-10-30 DIAGNOSIS — I10 Essential (primary) hypertension: Secondary | ICD-10-CM

## 2013-10-30 DIAGNOSIS — S42309S Unspecified fracture of shaft of humerus, unspecified arm, sequela: Secondary | ICD-10-CM

## 2013-10-30 DIAGNOSIS — S42301A Unspecified fracture of shaft of humerus, right arm, initial encounter for closed fracture: Secondary | ICD-10-CM | POA: Insufficient documentation

## 2013-10-30 DIAGNOSIS — F411 Generalized anxiety disorder: Secondary | ICD-10-CM

## 2013-10-30 DIAGNOSIS — W19XXXS Unspecified fall, sequela: Secondary | ICD-10-CM

## 2013-10-30 DIAGNOSIS — J449 Chronic obstructive pulmonary disease, unspecified: Secondary | ICD-10-CM

## 2013-10-30 DIAGNOSIS — E039 Hypothyroidism, unspecified: Secondary | ICD-10-CM

## 2013-10-30 DIAGNOSIS — E118 Type 2 diabetes mellitus with unspecified complications: Secondary | ICD-10-CM

## 2013-10-30 MED ORDER — CLORAZEPATE DIPOTASSIUM 3.75 MG PO TABS: 3.7500 mg | ORAL_TABLET | Freq: Every day | ORAL | Status: DC

## 2013-10-30 NOTE — Telephone Encounter (Signed)
[facility] 

## 2013-10-30 NOTE — Progress Notes (Signed)
Patient ID: [name], female   DOB: 1924/09/18, 80 y.o.   MRN: [id number]     [facility]  Allergies  Allergen Reactions  . Aricept [Donepezil Hydrochloride]     REACTION: delusions  . Hydrocodone-Acetaminophen     REACTION: U/K  . Metoprolol     REACTION: dizzy  . Nitrofurantoin   . Sulfonamide Derivatives     REACTION: unspecified     Chief Complaint  Patient presents with  . Hospitalization Follow-up    HPI:  She had a mechanical fall in which she suffered a right humerus and right hip fracture. She underwent a right hip orif to repair her right hip fracture. Her right humerus fracture is being treated conservatively with sling. More than likely this does represent a long term placement for her. She is a poor historian and is unable to fully participate in the hpi or ros.   Past Medical History  Diagnosis Date  . Hypertension   . Diabetes mellitus   . Hypothyroidism   . Vertigo   . Anemia   . Osteoarthritis   . GERD (gastroesophageal reflux disease)   . Diverticulosis   . Depression   . H/O: hysterectomy     at age 63  . COPD (chronic obstructive pulmonary disease)   . History of DVT (deep vein thrombosis)   . Anxiety   . Dyspnea   . Enlarged heart   . Fibromyalgia   . Degenerative joint disease   . Degenerative disk disease   . Tremor   . Falls   . Iron deficiency anemia, unspecified 07/15/2012  . CHF (congestive heart failure)   . Cancer     basal cell  . Atrial fibrillation 08/21/2013  . HIATAL HERNIA WITH REFLUX 10/13/2006    Qualifier: Diagnosis of  By: Silvio Pate MD, [facility]   . COPD with chronic bronchitis 10/13/2006    Never a primary smoker, but secondhand smoke from husband by history. Emphysema and chronic bronchitis, mild.    . Senile dementia, uncomplicated 2/70/6237    Qualifier: Diagnosis of  By: Copland MD, Frederico Hamman    . VERTIGO 10/02/2008    Qualifier: Diagnosis of  By: Lorelei Pont MD, Frederico Hamman    . TREMOR, ESSENTIAL 10/13/2006   Qualifier: Diagnosis of  By: Silvio Pate MD, [facility]   . POSITIVE PPD 06/02/2007    Qualifier: Diagnosis of  By: Julien Girt CMA, Marliss Czar    . COLONIC POLYPS, HX OF 10/13/2006    Qualifier: Diagnosis of  By: Silvio Pate MD, [facility]    Past Surgical History  Procedure Laterality Date  . Cholecystectomy  1944  . Appendectomy  1952  . Ovarian cyst removal  1982    secondary ventral hematoma  . Bladder tack  1997  . Cataract extraction, bilateral    . Kyphosis surgery    . Total abdominal hysterectomy    . Salpingoophorectomy      unilateral salpingo-oophorectomy  . Orif right hip       VITAL SIGNS BP 119/75  Pulse 78  Ht $R'4\' 9"'OQ$  (1.448 m)  Wt 121 lb (54.885 kg)  BMI 26.18 kg/m2   Patient's Medications  New Prescriptions   No medications on file  Previous Medications   CLORAZEPATE (TRANXENE) 3.75 MG TABLET    Take 1 tablet (3.75 mg total) by mouth at bedtime.   ENOXAPARIN (LOVENOX) 40 MG/0.4ML INJECTION    Inject 40 mg into the skin daily.   HYDROXYZINE (ATARAX/VISTARIL) 10 MG TABLET  Take 10 mg by mouth every 8 (eight) hours as needed.    LEVOTHYROXINE (SYNTHROID, LEVOTHROID) 75 MCG TABLET    Take 75 mcg by mouth daily.     METFORMIN (GLUCOPHAGE) 500 MG TABLET    Take 500 mg by mouth 2 (two) times daily with a meal.   MULTIPLE VITAMIN (MULTIVITAMIN PO)    Take 1 tablet by mouth daily.     OMEPRAZOLE (PRILOSEC) 20 MG CAPSULE    Take 20 mg by mouth daily.   RANITIDINE (ZANTAC) 75 MG TABLET    Take 75 mg by mouth daily as needed.    SERTRALINE (ZOLOFT) 50 MG TABLET    Take 50 mg by mouth daily.     TAPENTADOL (NUCYNTA) 50 MG TABS TABLET    Take 50 mg by mouth every 6 (six) hours as needed.   TIOTROPIUM (SPIRIVA) 18 MCG INHALATION CAPSULE    Place 1 capsule (18 mcg total) into inhaler and inhale daily.  Modified Medications   Modified Medication Previous Medication   FUROSEMIDE (LASIX) 20 MG TABLET furosemide (LASIX) 20 MG tablet      Take 20 mg by mouth daily as needed. For weight gain  of 4 pounds or more daily    Take 1.5 tablets (30 mg total) by mouth 2 (two) times daily as needed.   METOPROLOL SUCCINATE (TOPROL-XL) 100 MG 24 HR TABLET metoprolol succinate (TOPROL XL) 100 MG 24 hr tablet      Take 100 mg by mouth daily.    Take 1 tablet (100 mg total) by mouth daily.   POTASSIUM CHLORIDE (K-DUR) 10 MEQ TABLET potassium chloride (K-DUR) 10 MEQ tablet      Take 10 mEq by mouth daily as needed. To take with lasix    Take 1 tablet (10 mEq total) by mouth daily as needed.  Discontinued Medications   ACCU-CHEK AVIVA PLUS TEST STRIP       ACCU-CHEK SOFTCLIX LANCETS LANCETS       ACETAMINOPHEN (TYLENOL) 325 MG TABLET    Take 325 mg by mouth every 6 (six) hours as needed.   ASPIRIN 81 MG TABLET    Take 81 mg by mouth every 3 (three) days.    DOCUSATE SODIUM (COLACE PO)    Take by mouth every morning.   ESOMEPRAZOLE (NEXIUM) 40 MG CAPSULE    Take 40 mg by mouth daily.    FLUOCINONIDE CREAM (LIDEX) 0.05 %    Apply 1 application topically 3 (three) times daily.    IBUPROFEN (ADVIL) 200 MG CAPS    Take 200 mg by mouth daily as needed.    POLYETHYL GLYCOL-PROPYL GLYCOL (SYSTANE) 0.4-0.3 % SOLN    Apply to eye as needed.    SIGNIFICANT DIAGNOSTIC EXAMS     LABS REVIEWED:   10-24-13: wbc 10.2; hgb 12.1; hct 37.8; mcv 82; plt 225; glucose 118; bun 14; creat 0.80; k+3.7; na++ 138; alk phos 68; ast 23; alt 32; albumin 3.7  10-25-13: wbc 10.3; hgb 11.2; hct 33.3; mcv 82 plt 184 glucose 121; bun 16; creat 0.82; k+3.8; na++135; hgb a1c 6.4     Review of Systems  Unable to perform ROS    Physical Exam  Constitutional: No distress.  Frail   Neck: Neck supple. No tracheal deviation present. No thyromegaly present.  Cardiovascular: Normal rate, regular rhythm and intact distal pulses.   Respiratory: Effort normal and breath sounds normal. No respiratory distress. She has no wheezes.  GI: Soft. Bowel sounds are normal. She  exhibits no distension. There is no tenderness.    Musculoskeletal: She exhibits no edema.  Is able to move lower extremities; is status post right hip fracture. Right arm in sling.   Neurological: She is alert.  Skin: Skin is warm and dry. She is not diaphoretic.  Has bruising on right arm and right hip and leg        ASSESSMENT/ PLAN:  1. Right hip fracture: is status post orif; will continue therapy as directed; will continue lovenox 40 mg daily through 11-10-13. Will continue tapentadol 50 mg every 6 hours as needed for pain and will monitor her status   2. Right humerus fracture: will continue sling as directed; will continue therapy as directed; will continue lovenox 40 mg daily through 11-10-13. Will continue tapentadol 50 mg every 6 hours as needed for pain.   3. Hypothyroidism: will continue synthroid 75 mcg daily   4. Diabetes: will continue metformin 500 mg twice daily and will monitor   5. Jerrye Bushy: will continue prilosec 20 mg daily   6. Hypertension: is stable will continue toprol xl 100 mg daily and will monitor   7. CHF: is presently stable will continue lasix 20 mg daily as needed with k+ 10 meq will monitor  8. COPD: is stable will continue spiriva daily   9. Anxiety: is stable will continue zoloft 50 mg daily; with tranxene 3.75 mg nightly      Time spent with patient 50 minutes   Ok Edwards NP [facility]  Contact [phone] Monday through Friday 8am- 5pm  After hours call [phone]

## 2013-11-02 ENCOUNTER — Non-Acute Institutional Stay (SKILLED_NURSING_FACILITY): Payer: 59 | Admitting: Adult Health

## 2013-11-02 DIAGNOSIS — J069 Acute upper respiratory infection, unspecified: Secondary | ICD-10-CM

## 2013-11-05 ENCOUNTER — Other Ambulatory Visit: Payer: Self-pay

## 2013-11-05 LAB — URINALYSIS, COMPLETE
BILIRUBIN, UR: NEGATIVE
Glucose,UR: NEGATIVE mg/dL (ref 0–75)
KETONE: NEGATIVE
Nitrite: NEGATIVE
Ph: 5 (ref 4.5–8.0)
RBC,UR: 22 /HPF (ref 0–5)
SPECIFIC GRAVITY: 1.012 (ref 1.003–1.030)
Squamous Epithelial: 2

## 2013-11-07 ENCOUNTER — Encounter: Payer: Self-pay | Admitting: Adult Health

## 2013-11-07 LAB — URINE CULTURE

## 2013-11-07 NOTE — Progress Notes (Signed)
Patient ID: [name], female   DOB: May 06, 1924, 80 y.o.   MRN: [id number]     [facility]  Allergies  Allergen Reactions  . Aricept [Donepezil Hydrochloride]     REACTION: delusions  . Hydrocodone-Acetaminophen     REACTION: U/K  . Metoprolol     REACTION: dizzy  . Nitrofurantoin   . Sulfonamide Derivatives     REACTION: unspecified     Chief Complaint  Patient presents with  . Acute Visit    cough and follow up chest x-ray     HPI:  She is complaining of cough with congestion present. She states the sputum is green tinged. She denies any chest pain or shortness of breath. She denies any sore throat. She tells me that she has had the cough for over one week with sputum. She also says that amoxicillin works for her infections.    Past Medical History  Diagnosis Date  . Hypertension   . Diabetes mellitus   . Hypothyroidism   . Vertigo   . Anemia   . Osteoarthritis   . GERD (gastroesophageal reflux disease)   . Diverticulosis   . Depression   . H/O: hysterectomy     at age 67  . COPD (chronic obstructive pulmonary disease)   . History of DVT (deep vein thrombosis)   . Anxiety   . Dyspnea   . Enlarged heart   . Fibromyalgia   . Degenerative joint disease   . Degenerative disk disease   . Tremor   . Falls   . Iron deficiency anemia, unspecified 07/15/2012  . CHF (congestive heart failure)   . Cancer     basal cell  . Atrial fibrillation 08/21/2013  . HIATAL HERNIA WITH REFLUX 10/13/2006    Qualifier: Diagnosis of  By: Silvio Pate MD, [facility]   . COPD with chronic bronchitis 10/13/2006    Never a primary smoker, but secondhand smoke from husband by history. Emphysema and chronic bronchitis, mild.    . Senile dementia, uncomplicated 5/63/8756    Qualifier: Diagnosis of  By: Copland MD, Frederico Hamman    . VERTIGO 10/02/2008    Qualifier: Diagnosis of  By: Lorelei Pont MD, Frederico Hamman    . TREMOR, ESSENTIAL 10/13/2006    Qualifier: Diagnosis of  By: Silvio Pate MD, [facility]    . POSITIVE PPD 06/02/2007    Qualifier: Diagnosis of  By: Julien Girt CMA, Marliss Czar    . COLONIC POLYPS, HX OF 10/13/2006    Qualifier: Diagnosis of  By: Silvio Pate MD, [facility]     Past Surgical History  Procedure Laterality Date  . Cholecystectomy  1944  . Appendectomy  1952  . Ovarian cyst removal  1982    secondary ventral hematoma  . Bladder tack  1997  . Cataract extraction, bilateral    . Kyphosis surgery    . Total abdominal hysterectomy    . Salpingoophorectomy      unilateral salpingo-oophorectomy  . Orif right hip      VITAL SIGNS BP 160/70  Pulse 92  Ht $R'4\' 9"'KP$  (1.448 m)  Wt 120 lb (54.432 kg)  BMI 25.96 kg/m2   Patient's Medications  New Prescriptions   No medications on file  Previous Medications   CLORAZEPATE (TRANXENE) 3.75 MG TABLET    Take 1 tablet (3.75 mg total) by mouth at bedtime.   ENOXAPARIN (LOVENOX) 40 MG/0.4ML INJECTION    Inject 40 mg into the skin daily.   FUROSEMIDE (LASIX) 20 MG TABLET  Take 20 mg by mouth daily as needed. For weight gain of 4 pounds or more daily   HYDROXYZINE (ATARAX/VISTARIL) 10 MG TABLET    Take 10 mg by mouth every 8 (eight) hours as needed.    LEVOTHYROXINE (SYNTHROID, LEVOTHROID) 75 MCG TABLET    Take 75 mcg by mouth daily.     METFORMIN (GLUCOPHAGE) 500 MG TABLET    Take 500 mg by mouth 2 (two) times daily with a meal.   METOPROLOL SUCCINATE (TOPROL-XL) 100 MG 24 HR TABLET    Take 100 mg by mouth daily.   MULTIPLE VITAMIN (MULTIVITAMIN PO)    Take 1 tablet by mouth daily.     OMEPRAZOLE (PRILOSEC) 20 MG CAPSULE    Take 20 mg by mouth daily.   POTASSIUM CHLORIDE (K-DUR) 10 MEQ TABLET    Take 10 mEq by mouth daily as needed. To take with lasix   RANITIDINE (ZANTAC) 75 MG TABLET    Take 75 mg by mouth daily as needed.    SERTRALINE (ZOLOFT) 50 MG TABLET    Take 50 mg by mouth daily.     TAPENTADOL (NUCYNTA) 50 MG TABS TABLET    Take 50 mg by mouth every 6 (six) hours as needed.   TIOTROPIUM (SPIRIVA) 18 MCG INHALATION CAPSULE     Place 1 capsule (18 mcg total) into inhaler and inhale daily.  Modified Medications   No medications on file  Discontinued Medications   No medications on file    SIGNIFICANT DIAGNOSTIC EXAMS  11-01-13: chest x-ray: mild bibasilar atelectasis with no definite focal pulmonary infiltrate or pleural effusion. 2. Borderline cardiomegaly to minimal cardiomegaly 3. Possible age indeterminate fracture of the surgical neck of the right proximal humerus;   LABS REVIEWED:   10-24-13: wbc 10.2; hgb 12.1; hct 37.8; mcv 82; plt 225; glucose 118; bun 14; creat 0.80; k+3.7; na++ 138; alk phos 68; ast 23; alt 32; albumin 3.7  10-25-13: wbc 10.3; hgb 11.2; hct 33.3; mcv 82 plt 184 glucose 121; bun 16; creat 0.82; k+3.8; na++135; hgb a1c 6.4    Review of Systems  Constitutional: Positive for malaise/fatigue.  HENT: Positive for congestion. Negative for ear pain and sore throat.   Respiratory: Positive for cough and sputum production. Negative for shortness of breath and wheezing.   Cardiovascular: Negative for chest pain and palpitations.  Gastrointestinal: Negative for heartburn, abdominal pain and constipation.  Musculoskeletal: Negative for joint pain and myalgias.  Skin: Negative.   Neurological: Negative for headaches.  Psychiatric/Behavioral: The patient is not nervous/anxious.        Physical Exam  Constitutional: No distress.  Frail   Neck: Neck supple. No tracheal deviation present. No thyromegaly present.  Cardiovascular: Normal rate, regular rhythm and intact distal pulses.   Respiratory: Effort normal and breath sounds normal. No respiratory distress. She has no wheezes.  GI: Soft. Bowel sounds are normal. She exhibits no distension. There is no tenderness.  Musculoskeletal: She exhibits no edema.  Is able to move lower extremities; is status post right hip fracture. Right arm in sling.   Neurological: She is alert.  Skin: Skin is warm and dry. She is not diaphoretic.  Has bruising  on right arm and right hip and leg      ASSESSMENT/ PLAN:  1. URI: will being amoxicillin 500 mg three times daily for 10 days with florastor twice daily as well. Will monitor her status.     Ok Edwards NP [facility]  Contact [phone]  Monday through Friday 8am- 5pm  After hours call [phone]

## 2013-11-09 ENCOUNTER — Non-Acute Institutional Stay: Payer: 59 | Admitting: Internal Medicine

## 2013-11-09 DIAGNOSIS — IMO0002 Reserved for concepts with insufficient information to code with codable children: Secondary | ICD-10-CM

## 2013-11-09 DIAGNOSIS — S72141S Displaced intertrochanteric fracture of right femur, sequela: Secondary | ICD-10-CM

## 2013-11-09 DIAGNOSIS — E1165 Type 2 diabetes mellitus with hyperglycemia: Secondary | ICD-10-CM

## 2013-11-09 DIAGNOSIS — S42309S Unspecified fracture of shaft of humerus, unspecified arm, sequela: Secondary | ICD-10-CM

## 2013-11-09 DIAGNOSIS — I509 Heart failure, unspecified: Secondary | ICD-10-CM

## 2013-11-09 DIAGNOSIS — E039 Hypothyroidism, unspecified: Secondary | ICD-10-CM

## 2013-11-09 DIAGNOSIS — N3000 Acute cystitis without hematuria: Secondary | ICD-10-CM

## 2013-11-09 DIAGNOSIS — S42301S Unspecified fracture of shaft of humerus, right arm, sequela: Secondary | ICD-10-CM

## 2013-11-09 DIAGNOSIS — E118 Type 2 diabetes mellitus with unspecified complications: Secondary | ICD-10-CM

## 2013-11-09 DIAGNOSIS — S72009S Fracture of unspecified part of neck of unspecified femur, sequela: Secondary | ICD-10-CM

## 2013-11-09 DIAGNOSIS — I10 Essential (primary) hypertension: Secondary | ICD-10-CM

## 2013-11-09 DIAGNOSIS — F411 Generalized anxiety disorder: Secondary | ICD-10-CM

## 2013-11-09 NOTE — Progress Notes (Signed)
Patient ID: [name], female   DOB: [birth year], 80 y.o.   MRN: [id number]     Facility: [facility]    PCP: Maryland Pink, MD  Code Status: DNR  Allergies  Allergen Reactions  . Aricept [Donepezil Hydrochloride]     REACTION: delusions  . Hydrocodone-Acetaminophen     REACTION: U/K  . Metoprolol     REACTION: dizzy  . Nitrofurantoin   . Sulfonamide Derivatives     REACTION: unspecified    Chief Complaint: new admission  HPI:  80 y/o female patient is here for STR after hospital admission post fall with right humerus and right hip fracture. She underwent right hip ORIF and had a sling placed for right humerus fracture. She was put on pain medications and is here to undergo rehabilitation. She has been started on amoxicillin for uti. She has also been seen by psych services and started on buspar from today. Her cymbalata dosing has been increased on review of her chart. She mentions that her pain is currently controlled and denies any complaints.  Review of Systems:  Constitutional: Negative for fever, chills, weight loss, malaise/fatigue and diaphoresis.  HENT: Negative for congestion, hearing loss and sore throat.   Eyes: Negative for eye pain, blurred vision, double vision and discharge.  Respiratory: Negative for cough, sputum production, shortness of breath and wheezing.   Cardiovascular: Negative for chest pain, palpitations, orthopnea and leg swelling.  Gastrointestinal: Negative for heartburn, nausea, vomiting, abdominal pain, diarrhea and constipation.  Genitourinary: Negative for dysuria, urgency, frequency, hematuria and flank pain.  Musculoskeletal: Negative for back pain, falls, joint pain and myalgias.  Skin: Negative for itching and rash.  Neurological: Negative for weakness,dizziness, tingling, focal weakness and headaches.  Psychiatric/Behavioral: Negative for depression and memory loss. The patient is not nervous/anxious.      Past Medical History  Diagnosis Date  . Hypertension   . Diabetes mellitus   . Hypothyroidism   . Vertigo   . Anemia   . Osteoarthritis   . GERD (gastroesophageal reflux disease)   . Diverticulosis   . Depression   . H/O: hysterectomy     at age 78  . COPD (chronic obstructive pulmonary disease)   . History of DVT (deep vein thrombosis)   . Anxiety   . Dyspnea   . Enlarged heart   . Fibromyalgia   . Degenerative joint disease   . Degenerative disk disease   . Tremor   . Falls   . Iron deficiency anemia, unspecified 07/15/2012  . CHF (congestive heart failure)   . Cancer     basal cell  . Atrial fibrillation 08/21/2013  . HIATAL HERNIA WITH REFLUX 10/13/2006    Qualifier: Diagnosis of  By: Silvio Pate MD, Baird Cancer   . COPD with chronic bronchitis 10/13/2006    Never a primary smoker, but secondhand smoke from husband by history. Emphysema and chronic bronchitis, mild.    . Senile dementia, uncomplicated 8/65/7846    Qualifier: Diagnosis of  By: Copland MD, Frederico Hamman    . VERTIGO 10/02/2008    Qualifier: Diagnosis of  By: Lorelei Pont MD, Frederico Hamman    . TREMOR, ESSENTIAL 10/13/2006    Qualifier: Diagnosis of  By: Silvio Pate MD, Baird Cancer   . POSITIVE PPD 06/02/2007    Qualifier: Diagnosis of  By: Julien Girt CMA, Marliss Czar    . COLONIC POLYPS, HX OF 10/13/2006    Qualifier: Diagnosis of  By: Silvio Pate MD, Baird Cancer    Past Surgical History  Procedure Laterality Date  . Cholecystectomy  1944  . Appendectomy  1952  . Ovarian cyst removal  1982    secondary ventral hematoma  . Bladder tack  1997  . Cataract extraction, bilateral    . Kyphosis surgery    . Total abdominal hysterectomy    . Salpingoophorectomy      unilateral salpingo-oophorectomy  . Orif right hip     Social History:   reports that she has never smoked. She has never used smokeless tobacco. She reports that she does not drink alcohol or use illicit drugs.  Family History  Problem Relation Age of Onset  . Stroke Mother    . Cancer Father     Pancreatic Cancer  . Kidney disease Sister   . Heart disease Brother   . Stroke Brother   . Heart disease Brother     Medications: Patient's Medications  New Prescriptions   No medications on file  Previous Medications   CLORAZEPATE (TRANXENE) 3.75 MG TABLET    Take 1 tablet (3.75 mg total) by mouth at bedtime.   FUROSEMIDE (LASIX) 20 MG TABLET    Take 20 mg by mouth daily as needed. For weight gain of 4 pounds or more daily   HYDROXYZINE (ATARAX/VISTARIL) 10 MG TABLET    Take 10 mg by mouth every 8 (eight) hours as needed.    LEVOTHYROXINE (SYNTHROID, LEVOTHROID) 75 MCG TABLET    Take 75 mcg by mouth daily.     METFORMIN (GLUCOPHAGE) 500 MG TABLET    Take 500 mg by mouth 2 (two) times daily with a meal.   METOPROLOL SUCCINATE (TOPROL-XL) 100 MG 24 HR TABLET    Take 100 mg by mouth daily.   MULTIPLE VITAMIN (MULTIVITAMIN PO)    Take 1 tablet by mouth daily.     OMEPRAZOLE (PRILOSEC) 20 MG CAPSULE    Take 20 mg by mouth daily.   POTASSIUM CHLORIDE (K-DUR) 10 MEQ TABLET    Take 10 mEq by mouth daily as needed. To take with lasix   RANITIDINE (ZANTAC) 75 MG TABLET    Take 75 mg by mouth daily as needed.    SERTRALINE (ZOLOFT) 50 MG TABLET    Take 50 mg by mouth daily.     TIOTROPIUM (SPIRIVA) 18 MCG INHALATION CAPSULE    Place 1 capsule (18 mcg total) into inhaler and inhale daily.  Modified Medications   Modified Medication Previous Medication   TAPENTADOL (NUCYNTA) 50 MG TABS TABLET tapentadol (NUCYNTA) 50 MG TABS tablet      Take one tablet by mouth every 6 hours as needed for severe pain    Take 50 mg by mouth every 6 (six) hours as needed.  Discontinued Medications   ENOXAPARIN (LOVENOX) 40 MG/0.4ML INJECTION    Inject 40 mg into the skin daily.     Physical Exam: Filed Vitals:   11/09/13 1446  BP: 138/80  Pulse: 77  Temp: 97.3 F (36.3 C)  Resp: 18  SpO2: 95%    General- elderly female in no acute distress Head- atraumatic, normocephalic Eyes-  PERRLA, EOMI, no pallor, no icterus, no discharge Neck- no lymphadenopathy Cardiovascular- normal s1,s2, no murmurs Respiratory- bilateral clear to auscultation, no wheeze, no rhonchi Abdomen- bowel sounds present, soft, non tender Musculoskeletal- able to move all 4 extremities, right arm in sling, able to move the digits.good distal pulses. Right hip ROM limited with surgery and pain, no leg edema Neurological- no focal deficit Skin- warm and dry, incision site  healing well Psychiatry- alert and oriented to person, place and time, normal mood and affect  Labs reviewed: Basic Metabolic Panel:  Recent Labs  04/26/13 1444 10/13/13 1522  NA 132* 136  K 4.1 4.1  CL 98 95*  CO2 24 24  GLUCOSE 119* 78  BUN 16 17  CREATININE 0.8 0.87  CALCIUM 9.2 9.3   CBC:  Recent Labs  02/15/13 1439 04/26/13 1454  WBC 14.2* 10.9*  NEUTROABS 10.8* 6.7  HGB 12.4 11.9*  HCT 36.2 36.7  MCV 88 89.5  PLT 298 263.0   10-24-13: wbc 10.2; hgb 12.1; hct 37.8; mcv 82; plt 225; glucose 118; bun 14; creat 0.80; k+3.7; na++ 138; alk phos 68; ast 23; alt 32; albumin 3.7   10-25-13: wbc 10.3; hgb 11.2; hct 33.3; mcv 82 plt 184 glucose 121; bun 16; creat 0.82; k+3.8; na++135; hgb a1c 6.4   Assessment/Plan  Right hip fracture  status post ORIF. Will have patient work with PT/OT as tolerated to regain strength and restore function.  Fall precautions are in place. continue lovenox 40 mg daily for dvt prophylaxis. continue tapentadol 50 mg every 6 hours as needed for pain. Has orthopedic f/u  Right humerus fracture continue sling as directed and continue tapentadol 50 mg every 6 hours as needed for pain.   UTI Complete course of amoxicillin, monitor hydration, cbc and temp curve  Anxiety On buspar started today. Also on cymbalta with dosing increased to 60 mg daily today. Monitor  CHF Stable, continue toprol, lasix and kcl supplement, monitor weight  Hypothyroidism continue synthroid 75 mcg daily    Diabetes Monitor cbg, continue metformin 500 mg twice daily   Jerrye Bushy continue prilosec 20 mg daily   Hypertension stable on toprol xl 100 mg daily    Family/ staff Communication: reviewed care plan with patient and nursing supervisor   Goals of care: short term rehabilitation   Labs/tests ordered: cbc, cmp    Blanchie Serve, MD  [place] [phone] (Monday-Friday 8 am - 5 pm) [phone] (afterhours)

## 2013-11-14 ENCOUNTER — Other Ambulatory Visit: Payer: Self-pay | Admitting: *Deleted

## 2013-11-14 MED ORDER — TAPENTADOL HCL 50 MG PO TABS: ORAL_TABLET | ORAL | Status: DC

## 2013-11-14 NOTE — Telephone Encounter (Signed)
[facility] 

## 2013-11-16 ENCOUNTER — Non-Acute Institutional Stay (SKILLED_NURSING_FACILITY): Payer: 59 | Admitting: Adult Health

## 2013-11-16 DIAGNOSIS — J449 Chronic obstructive pulmonary disease, unspecified: Secondary | ICD-10-CM

## 2013-11-16 DIAGNOSIS — S72141S Displaced intertrochanteric fracture of right femur, sequela: Secondary | ICD-10-CM

## 2013-11-16 DIAGNOSIS — S42309S Unspecified fracture of shaft of humerus, unspecified arm, sequela: Secondary | ICD-10-CM

## 2013-11-16 DIAGNOSIS — I4891 Unspecified atrial fibrillation: Secondary | ICD-10-CM

## 2013-11-16 DIAGNOSIS — S72009S Fracture of unspecified part of neck of unspecified femur, sequela: Secondary | ICD-10-CM

## 2013-11-16 DIAGNOSIS — S42301S Unspecified fracture of shaft of humerus, right arm, sequela: Secondary | ICD-10-CM

## 2013-11-19 DIAGNOSIS — E1149 Type 2 diabetes mellitus with other diabetic neurological complication: Secondary | ICD-10-CM

## 2013-11-19 DIAGNOSIS — S42309D Unspecified fracture of shaft of humerus, unspecified arm, subsequent encounter for fracture with routine healing: Secondary | ICD-10-CM

## 2013-11-19 DIAGNOSIS — S72009D Fracture of unspecified part of neck of unspecified femur, subsequent encounter for closed fracture with routine healing: Secondary | ICD-10-CM

## 2013-11-19 DIAGNOSIS — IMO0001 Reserved for inherently not codable concepts without codable children: Secondary | ICD-10-CM

## 2013-11-21 ENCOUNTER — Telehealth: Payer: Self-pay

## 2013-11-21 NOTE — Telephone Encounter (Signed)
We need blood pressure measurements, heart rate measurements, BMP Perhaps primary care can provide these Also perhaps she can check her blood pressure at home for [place]

## 2013-11-21 NOTE — Telephone Encounter (Signed)
Spoke w/ pt and daughter together on speaker phone.  Pt states that she has not taken amlodipine since last ov on 10/13/13. Reports he only BP med she is taking is metoprolol.  States that we need to get her BP meds figured out, as she her "symptoms are getting worse".  She states that when she lies down in the bed, she has the sensation that she is going to fall through it.  Reports that she had similar sx several years ago for an inner ear infection and saw Dr. Pryor Ochoa for this.  Apologized to pt, as we are in clinic and I was trying to call her in between pts here in the office and I keep having to put her on hold.  Asked if I can call her back when I can devote all of my attention to her and stop interrupting her.  She is agreeable and will wait for me to call her back.

## 2013-11-21 NOTE — Telephone Encounter (Signed)
Pt states she has had swelling in feet,spells of "after I lay down, 10 seconds later, I feel like I'm still going down in the covers". States that she had an episode where she started running backwards and couldn't stop and ran into the table and broke her leg.  States this morning he balance went off again, states this is coming from Amlodopine, states she hasnt taken this for 2 weeks. Please call.

## 2013-11-21 NOTE — Telephone Encounter (Signed)
Spoke w/ pt's daughter.  She states that on 8/25, pt was at fridge getting some water, when she felt dizzy, fell and broke her shoulder. Reports BP today at 9:15, 128/85, pulse 77.  At 4:05 today 139/74, pulse 77. Pt has not any amlodipine since 10/13/13.  Discussed w/ her that this is no longer a side effect of this med, as it is no longer in her system.  Reports that pt did not have any swelling while she was in rehab facility, so lasix was not given during her stay there.  Pt has since resumed her lasix dose.   She reports that pt is staying hydrated, denies SOB, has not problems w/ urination and is not constipated, but does have some abdominal swelling.  She does have scales at home in order to obtain daily wts.  Pt has appt w/ PCP on Thurs, but wants to make Dr. Rockey Situ aware, as she is c/o increased episodes of dizziness and is certain that it is a side effect of her medicine.  Advised daughter to keep appt w/ PCP.  She asks that Dr. Rockey Situ make any suggestions in the meantime.  Please advise.  Thank you.

## 2013-11-22 ENCOUNTER — Encounter: Payer: Self-pay | Admitting: Adult Health

## 2013-11-22 NOTE — Telephone Encounter (Signed)
Spoke w/ Hilda Blades.  Advised her of Dr. Donivan Scull recommendation.  She is agreeable, will ask Dr. Kary Kos to draw BMP and send results to our office.  Asked her to bring BP readings to Dr. Barbarann Ehlers office, as well.  Asked her to call back w/ further questions or concerns.

## 2013-11-22 NOTE — Progress Notes (Signed)
Patient ID: [name], female   DOB: [birth year], 80 y.o.   MRN: [id number]  .   [facility]   Allergies  Allergen Reactions  . Aricept [Donepezil Hydrochloride]     REACTION: delusions  . Hydrocodone-Acetaminophen     REACTION: U/K  . Metoprolol     REACTION: dizzy  . Nitrofurantoin   . Sulfonamide Derivatives     REACTION: unspecified     Chief Complaint  Patient presents with  . Discharge Note    HPI:  She is being discharged to home after a right hip and right humeral fracture with home health for pt/ot/aid. She will need a wheelchair; she has declined a hospital; which she would benefit from. She will need her prescriptions written and will need a follow up with her pcp.    Past Medical History  Diagnosis Date  . Hypertension   . Diabetes mellitus   . Hypothyroidism   . Vertigo   . Anemia   . Osteoarthritis   . GERD (gastroesophageal reflux disease)   . Diverticulosis   . Depression   . H/O: hysterectomy     at age 50  . COPD (chronic obstructive pulmonary disease)   . History of DVT (deep vein thrombosis)   . Anxiety   . Dyspnea   . Enlarged heart   . Fibromyalgia   . Degenerative joint disease   . Degenerative disk disease   . Tremor   . Falls   . Iron deficiency anemia, unspecified 07/15/2012  . CHF (congestive heart failure)   . Cancer     basal cell  . Atrial fibrillation 08/21/2013  . HIATAL HERNIA WITH REFLUX 10/13/2006    Qualifier: Diagnosis of  By: Silvio Pate MD, Baird Cancer   . COPD with chronic bronchitis 10/13/2006    Never a primary smoker, but secondhand smoke from husband by history. Emphysema and chronic bronchitis, mild.    . Senile dementia, uncomplicated 0/17/7939    Qualifier: Diagnosis of  By: Copland MD, Frederico Hamman    . VERTIGO 10/02/2008    Qualifier: Diagnosis of  By: Lorelei Pont MD, Frederico Hamman    . TREMOR, ESSENTIAL 10/13/2006    Qualifier: Diagnosis of  By: Silvio Pate MD, Baird Cancer   . POSITIVE PPD 06/02/2007    Qualifier: Diagnosis  of  By: Julien Girt CMA, Marliss Czar    . COLONIC POLYPS, HX OF 10/13/2006    Qualifier: Diagnosis of  By: Silvio Pate MD, Baird Cancer     Past Surgical History  Procedure Laterality Date  . Cholecystectomy  1944  . Appendectomy  1952  . Ovarian cyst removal  1982    secondary ventral hematoma  . Bladder tack  1997  . Cataract extraction, bilateral    . Kyphosis surgery    . Total abdominal hysterectomy    . Salpingoophorectomy      unilateral salpingo-oophorectomy  . Orif right hip      VITAL SIGNS BP 127/90  Pulse 70  Ht $R'4\' 10"'gi$  (1.473 m)  Wt 120 lb (54.432 kg)  BMI 25.09 kg/m2   Patient's Medications  New Prescriptions   No medications on file  Previous Medications   buspar 5 m  take daily   CLORAZEPATE (TRANXENE) 3.75 MG TABLET    Take 1 tablet (3.75 mg total) by mouth at bedtime.   FUROSEMIDE (LASIX) 20 MG TABLET    Take 20 mg by mouth daily as needed. For weight gain of 4 pounds or more daily   HYDROXYZINE (ATARAX/VISTARIL) 10  MG TABLET    Take 10 mg by mouth every 8 (eight) hours as needed.    LEVOTHYROXINE (SYNTHROID, LEVOTHROID) 75 MCG TABLET    Take 75 mcg by mouth daily.     METFORMIN (GLUCOPHAGE) 500 MG TABLET    Take 500 mg by mouth 2 (two) times daily with a meal.   METOPROLOL SUCCINATE (TOPROL-XL) 100 MG 24 HR TABLET    Take 100 mg by mouth daily.   MULTIPLE VITAMIN (MULTIVITAMIN PO)    Take 1 tablet by mouth daily.     OMEPRAZOLE (PRILOSEC) 20 MG CAPSULE    Take 20 mg by mouth daily.   POTASSIUM CHLORIDE (K-DUR) 10 MEQ TABLET    Take 10 mEq by mouth daily as needed. To take with lasix   RANITIDINE (ZANTAC) 75 MG TABLET    Take 75 mg by mouth daily as needed.    SERTRALINE (ZOLOFT) 50 MG TABLET    Take 50 mg by mouth daily.     TAPENTADOL (NUCYNTA) 50 MG TABS TABLET    Take one tablet by mouth every 6 hours as needed for severe pain   TIOTROPIUM (SPIRIVA) 18 MCG INHALATION CAPSULE    Place 1 capsule (18 mcg total) into inhaler and inhale daily.  Modified Medications   No  medications on file  Discontinued Medications   ENOXAPARIN (LOVENOX) 40 MG/0.4ML INJECTION    Inject 40 mg into the skin daily.    SIGNIFICANT DIAGNOSTIC EXAMS  LABS REVIEWED:   10-24-13: wbc 10.2; hgb 12.1; hct 37.8; mcv 82; plt 225; glucose 118; bun 14; creat 0.80; k+3.7; na++ 138; alk phos 68; ast 23; alt 32; albumin 3.7  10-25-13: wbc 10.3; hgb 11.2; hct 33.3; mcv 82 plt 184 glucose 121; bun 16; creat 0.82; k+3.8; na++135; hgb a1c 6.4    Review of Systems  Constitutional: Negative for malaise/fatigue.  Respiratory: Negative for cough and shortness of breath.   Cardiovascular: Negative for chest pain and palpitations.  Gastrointestinal: Negative for heartburn, abdominal pain and constipation.  Musculoskeletal: Positive for joint pain and myalgias.       From her fractures; which is controlled  Skin: Negative.   Psychiatric/Behavioral: Negative for depression. The patient is not nervous/anxious.        Physical Exam  Constitutional: No distress.  Frail   Neck: Neck supple. No tracheal deviation present. No thyromegaly present.  Cardiovascular: Normal rate, regular rhythm and intact distal pulses.   Respiratory: Effort normal and breath sounds normal. No respiratory distress. She has no wheezes.  GI: Soft. Bowel sounds are normal. She exhibits no distension. There is no tenderness.  Musculoskeletal: She exhibits no edema.  Is able to move lower extremities; is status post right hip fracture. Right arm in sling.   Neurological: She is alert.  Skin: Skin is warm and dry. She is not diaphoretic.         ASSESSMENT/ PLAN:   Will discharge to home with home health for pt/ot to improve upon gait; strength and independence with adl's and aid for adl care. she will need a  Wheelchair in order to maintain her current level of independence with adl's. She has declined a hospital bed. Her prescriptions have been written for a 30 day supply of her medications with tapentadol 50 mg  #20 tabs.  She has follow up with Dr. Maryland Pink 11-23-13 at 1:30pm     Time spent with patient 45 minutes   Ok Edwards NP [facility]  Contact [phone]  Monday through Friday 8am- 5pm  After hours call [phone]

## 2013-11-30 ENCOUNTER — Other Ambulatory Visit: Payer: Self-pay | Admitting: Gastroenterology

## 2013-11-30 DIAGNOSIS — R1084 Generalized abdominal pain: Secondary | ICD-10-CM

## 2013-12-11 ENCOUNTER — Ambulatory Visit
Admission: RE | Admit: 2013-12-11 | Discharge: 2013-12-11 | Disposition: A | Discharge status: Home / Self Care | Payer: Medicare Other | Source: Ambulatory Visit | Attending: Gastroenterology | Admitting: Gastroenterology

## 2013-12-11 DIAGNOSIS — R1084 Generalized abdominal pain: Secondary | ICD-10-CM

## 2014-01-04 ENCOUNTER — Telehealth: Payer: Self-pay | Admitting: Hematology & Oncology

## 2014-01-04 ENCOUNTER — Telehealth: Payer: Self-pay | Admitting: *Deleted
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
## 2014-01-04 NOTE — Telephone Encounter (Signed)
Pt called said she needed to be seen urgently. I transferred call to RN to triage

## 2014-01-08 ENCOUNTER — Ambulatory Visit: Payer: Medicare Other | Admitting: Hematology & Oncology

## 2014-01-08 ENCOUNTER — Other Ambulatory Visit: Payer: Medicare Other | Admitting: Lab

## 2014-01-08 ENCOUNTER — Telehealth: Payer: Self-pay | Admitting: Hematology & Oncology

## 2014-01-08 NOTE — Telephone Encounter (Signed)
Patient called and cx 01/08/14 apt and stated she will call back to resch

## 2014-01-19 ENCOUNTER — Encounter: Payer: Self-pay | Admitting: Cardiovascular Disease

## 2014-01-19 ENCOUNTER — Ambulatory Visit (INDEPENDENT_AMBULATORY_CARE_PROVIDER_SITE_OTHER): Payer: Medicare Other | Admitting: Cardiovascular Disease

## 2014-01-19 VITALS — BP 130/80 | HR 80 | Ht <= 58 in | Wt 117.0 lb

## 2014-01-19 DIAGNOSIS — I10 Essential (primary) hypertension: Secondary | ICD-10-CM

## 2014-01-19 DIAGNOSIS — I509 Heart failure, unspecified: Secondary | ICD-10-CM

## 2014-01-19 DIAGNOSIS — J449 Chronic obstructive pulmonary disease, unspecified: Secondary | ICD-10-CM

## 2014-01-19 DIAGNOSIS — W19XXXS Unspecified fall, sequela: Secondary | ICD-10-CM

## 2014-01-19 DIAGNOSIS — I4891 Unspecified atrial fibrillation: Secondary | ICD-10-CM

## 2014-01-19 MED ORDER — POTASSIUM CHLORIDE ER 10 MEQ PO TBCR: 10.0000 meq | EXTENDED_RELEASE_TABLET | Freq: Two times a day (BID) | ORAL | Status: DC | PRN

## 2014-01-19 MED ORDER — FUROSEMIDE 20 MG PO TABS: 20.0000 mg | ORAL_TABLET | Freq: Two times a day (BID) | ORAL | Status: DC | PRN

## 2014-01-19 NOTE — Assessment & Plan Note (Signed)
Heart rate relatively well controlled. This is contributing to her diastolic CHF. Not on anticoagulation as she is a high fall risk

## 2014-01-19 NOTE — Assessment & Plan Note (Signed)
Chronic diastolic CHF. Essentially taking Lasix every other day. Minimal ankle edema, trace pitting. Suggested she continue her current regimen, restart her potassium when she takes her Lasix

## 2014-01-19 NOTE — Assessment & Plan Note (Signed)
Blood pressure improved by holding amlodipine. No changes to her medications. Well controlled today

## 2014-01-19 NOTE — Assessment & Plan Note (Signed)
Not on anticoagulation as she is a high fall risk.

## 2014-01-19 NOTE — Progress Notes (Signed)
Patient ID: [name], female    DOB: Sep 14, 1924, 80 y.o.   MRN: [id number]  HPI Comments: Ms. Vensel is an 80 year old woman with history of chronic atrial fibrillation, HTN, Dm, hypothyroidism, vertigo, anemia, OA, GERD, depression, COPD, prior DVT, DJD and recurrent falls. Negative event monitor back in 2013. She presents for routine followup of her atrial fibrillation Patient of Dr. Kary Kos numerous falls in the past,  Prior pelvic fracture after a fall  In follow-up today, she reports that she is doing well. She continues to have mild edema around her ankles. She takes Lasix for 3 days in a row, then takes several days off, then restarts Lasix as needed. She has not been taking much potassium as she did not think that she needed this. Otherwise legs are weak, she has full-time caretaker almost around-the-clock. She reports her blood pressure has been relatively well controlled. No recent falls but she does require assistance to ambulate. He denies any tachycardia or palpitations. Amlodipine was held on a prior clinic visit for her leg swelling and low blood pressure.  EKG on today's visit shows atrial fibrillation with ventricular rate 80 bpm, poor R-wave progression  Other past medical history Recent imaging was reviewed April 2015 MRI lumbar spine showing sacral fractures, scoliosis, spondylosis CT chest and abdomen January 2015 showing remote rib fractures, old healed fracture of the manubrium, DJD, pubic ramus fracture on the left  Lab work showing creatinine 0.56, potassium 3.2  Lab work on prior trip to the emergency room. BNP  2800, hematocrit 32.9 Normal renal function may 2015 Chest x-ray 07/13/2013 showing acute bronchitis      Outpatient Encounter Prescriptions as of 01/19/2014  Medication Sig  . clorazepate (TRANXENE) 3.75 MG tablet Take 1 tablet (3.75 mg total) by mouth at bedtime.  . furosemide (LASIX) 20 MG tablet Take 20 mg by mouth daily as needed. For weight  gain of 4 pounds or more daily  . hydrOXYzine (ATARAX/VISTARIL) 10 MG tablet Take 10 mg by mouth every 8 (eight) hours as needed.   Marland Kitchen ibuprofen (ADVIL,MOTRIN) 200 MG tablet Take 200 mg by mouth every 6 (six) hours as needed.   Marland Kitchen levothyroxine (SYNTHROID, LEVOTHROID) 75 MCG tablet Take 75 mcg by mouth daily.    . metFORMIN (GLUCOPHAGE) 500 MG tablet Take 500 mg by mouth 2 (two) times daily with a meal.  . metoprolol succinate (TOPROL-XL) 100 MG 24 hr tablet Take 100 mg by mouth daily.  . Multiple Vitamin (MULTIVITAMIN PO) Take 1 tablet by mouth daily.    Marland Kitchen omeprazole (PRILOSEC) 20 MG capsule Take 20 mg by mouth daily.  . potassium chloride (K-DUR) 10 MEQ tablet Take 10 mEq by mouth daily as needed. To take with lasix  . ranitidine (ZANTAC) 75 MG tablet Take 75 mg by mouth daily as needed.   . sertraline (ZOLOFT) 50 MG tablet Take 50 mg by mouth daily.    . tapentadol (NUCYNTA) 50 MG TABS tablet Take one tablet by mouth every 6 hours as needed for severe pain  . tiotropium (SPIRIVA) 18 MCG inhalation capsule Place 1 capsule (18 mcg total) into inhaler and inhale daily.  . traMADol (ULTRAM) 50 MG tablet Take 50 mg by mouth every 6 (six) hours as needed.    Social history  reports that she has never smoked. She has never used smokeless tobacco. She reports that she does not drink alcohol or use illicit drugs.   Review of Systems  Constitutional: Negative.   Respiratory:  Negative.   Cardiovascular: Positive for leg swelling.  Gastrointestinal: Negative.   Musculoskeletal: Positive for gait problem.  Neurological: Negative.   Hematological: Negative.   All other systems reviewed and are negative.   BP 130/80 mmHg  Pulse 80  Ht 4\' 9"  (1.448 m)  Wt 117 lb (53.071 kg)  BMI 25.31 kg/m2  Physical Exam  Constitutional: She is oriented to person, place, and time. She appears well-developed and well-nourished.  HENT:  Head: Normocephalic.  Nose: Nose normal.  Mouth/Throat: Oropharynx is  clear and moist.  Eyes: Conjunctivae are normal. Pupils are equal, round, and reactive to light.  Neck: Normal range of motion. Neck supple. No JVD present.  Cardiovascular: Normal rate, S1 normal, S2 normal, normal heart sounds and intact distal pulses.  An irregularly irregular rhythm present. Exam reveals no gallop and no friction rub.   No murmur heard. Trace ankle edema, worse on the left than the right  Pulmonary/Chest: Effort normal and breath sounds normal. No respiratory distress. She has no wheezes. She has no rales. She exhibits no tenderness.  Abdominal: Soft. Bowel sounds are normal. She exhibits no distension. There is no tenderness.  Musculoskeletal: Normal range of motion. She exhibits edema. She exhibits no tenderness.  Lymphadenopathy:    She has no cervical adenopathy.  Neurological: She is alert and oriented to person, place, and time. Coordination normal.  Skin: Skin is warm and dry. No rash noted. No erythema.  Psychiatric: She has a normal mood and affect. Her behavior is normal. Judgment and thought content normal.    Assessment and Plan  Nursing note and vitals reviewed.

## 2014-01-19 NOTE — Patient Instructions (Signed)
You are doing well. No medication changes were made.  Please call us if you have new issues that need to be addressed before your next appt.  Your physician wants you to follow-up in: 6 months.  You will receive a reminder letter in the mail two months in advance. If you don't receive a letter, please call our office to schedule the follow-up appointment.   

## 2014-01-19 NOTE — Assessment & Plan Note (Signed)
Stable mild shortness of breath. Essentially wheelchair-bound, walks minimal distance with a walker Not a major issue at this time

## 2014-02-02 LAB — BASIC METABOLIC PANEL
ANION GAP: 10 (ref 7–16)
BUN: 15 mg/dL (ref 7–18)
Calcium, Total: 8.6 mg/dL (ref 8.5–10.1)
Chloride: 98 mmol/L (ref 98–107)
Co2: 27 mmol/L (ref 21–32)
Creatinine: 0.95 mg/dL (ref 0.60–1.30)
EGFR (African American): >60
EGFR (Non-African Amer.): 59 — ABNORMAL LOW
Glucose: 132 mg/dL — ABNORMAL HIGH (ref 65–99)
OSMOLALITY: 273 (ref 275–301)
Potassium: 4 mmol/L (ref 3.5–5.1)
SODIUM: 135 mmol/L — AB (ref 136–145)

## 2014-02-02 LAB — CBC
HCT: 41.3 % (ref 35.0–47.0)
HGB: 13.1 g/dL (ref 12.0–16.0)
MCH: 26.7 pg (ref 26.0–34.0)
MCHC: 31.8 g/dL — AB (ref 32.0–36.0)
MCV: 84 fL (ref 80–100)
Platelet: 218 10*3/uL (ref 150–440)
RBC: 4.91 10*6/uL (ref 3.80–5.20)
RDW: 18.4 % — ABNORMAL HIGH (ref 11.5–14.5)
WBC: 10.6 10*3/uL (ref 3.6–11.0)

## 2014-02-02 LAB — PROTIME-INR
INR: 1.3
PROTHROMBIN TIME: 16.2 s — AB (ref 11.5–14.7)

## 2014-02-03 ENCOUNTER — Inpatient Hospital Stay: Payer: Self-pay | Admitting: Internal Medicine

## 2014-02-03 LAB — BASIC METABOLIC PANEL
Anion Gap: 10 (ref 7–16)
BUN: 13 mg/dL (ref 7–18)
CALCIUM: 8.6 mg/dL (ref 8.5–10.1)
Chloride: 100 mmol/L (ref 98–107)
Co2: 27 mmol/L (ref 21–32)
Creatinine: 0.67 mg/dL (ref 0.60–1.30)
EGFR (Non-African Amer.): >60
Glucose: 98 mg/dL (ref 65–99)
Osmolality: 274 (ref 275–301)
POTASSIUM: 3.8 mmol/L (ref 3.5–5.1)
SODIUM: 137 mmol/L (ref 136–145)

## 2014-02-03 LAB — CBC WITH DIFFERENTIAL/PLATELET
Basophil #: 0.1 10*3/uL (ref 0.0–0.1)
Basophil %: 0.8 %
EOS ABS: 0.1 10*3/uL (ref 0.0–0.7)
Eosinophil %: 0.8 %
HCT: 39.8 % (ref 35.0–47.0)
HGB: 12.8 g/dL (ref 12.0–16.0)
Lymphocyte #: 2.4 10*3/uL (ref 1.0–3.6)
Lymphocyte %: 23.3 %
MCH: 26.9 pg (ref 26.0–34.0)
MCHC: 32.1 g/dL (ref 32.0–36.0)
MCV: 84 fL (ref 80–100)
Monocyte #: 1.1 x10 3/mm — ABNORMAL HIGH (ref 0.2–0.9)
Monocyte %: 11.1 %
NEUTROS ABS: 6.6 10*3/uL — AB (ref 1.4–6.5)
Neutrophil %: 64 %
PLATELETS: 206 10*3/uL (ref 150–440)
RBC: 4.74 10*6/uL (ref 3.80–5.20)
RDW: 18.4 % — ABNORMAL HIGH (ref 11.5–14.5)
WBC: 10.3 10*3/uL (ref 3.6–11.0)

## 2014-02-03 LAB — URINALYSIS, COMPLETE
BILIRUBIN, UR: NEGATIVE
Bacteria: NONE SEEN
GLUCOSE, UR: NEGATIVE mg/dL (ref 0–75)
Ketone: NEGATIVE
Nitrite: NEGATIVE
Ph: 5 (ref 4.5–8.0)
RBC,UR: 51 /HPF (ref 0–5)
Specific Gravity: 1.02 (ref 1.003–1.030)
WBC UR: 32 /HPF (ref 0–5)

## 2014-02-03 LAB — PROTIME-INR
INR: 1.3
Prothrombin Time: 16.3 secs — ABNORMAL HIGH (ref 11.5–14.7)

## 2014-02-03 LAB — APTT: Activated PTT: 30.5 secs (ref 23.6–35.9)

## 2014-02-04 LAB — CBC WITH DIFFERENTIAL/PLATELET
Basophil #: 0.1 10*3/uL (ref 0.0–0.1)
Basophil %: 0.9 %
Eosinophil #: 0.2 10*3/uL (ref 0.0–0.7)
Eosinophil %: 1.5 %
HCT: 38.3 % (ref 35.0–47.0)
HGB: 12.1 g/dL (ref 12.0–16.0)
LYMPHS PCT: 22 %
Lymphocyte #: 2.8 10*3/uL (ref 1.0–3.6)
MCH: 26.8 pg (ref 26.0–34.0)
MCHC: 31.7 g/dL — AB (ref 32.0–36.0)
MCV: 85 fL (ref 80–100)
Monocyte #: 1.4 x10 3/mm — ABNORMAL HIGH (ref 0.2–0.9)
Monocyte %: 11 %
NEUTROS ABS: 8.2 10*3/uL — AB (ref 1.4–6.5)
Neutrophil %: 64.6 %
Platelet: 187 10*3/uL (ref 150–440)
RBC: 4.52 10*6/uL (ref 3.80–5.20)
RDW: 18.3 % — ABNORMAL HIGH (ref 11.5–14.5)
WBC: 12.6 10*3/uL — ABNORMAL HIGH (ref 3.6–11.0)

## 2014-02-04 LAB — BASIC METABOLIC PANEL
Anion Gap: 7 (ref 7–16)
BUN: 14 mg/dL (ref 7–18)
Calcium, Total: 8.2 mg/dL — ABNORMAL LOW (ref 8.5–10.1)
Chloride: 103 mmol/L (ref 98–107)
Co2: 25 mmol/L (ref 21–32)
Creatinine: 0.81 mg/dL (ref 0.60–1.30)
EGFR (African American): >60
EGFR (Non-African Amer.): >60
Glucose: 107 mg/dL — ABNORMAL HIGH (ref 65–99)
Osmolality: 271 (ref 275–301)
Potassium: 3.8 mmol/L (ref 3.5–5.1)
Sodium: 135 mmol/L — ABNORMAL LOW (ref 136–145)

## 2014-02-04 LAB — TSH: Thyroid Stimulating Horm: 3.24 u[IU]/mL

## 2014-02-04 LAB — HEMOGLOBIN A1C: Hemoglobin A1C: 6.1 % (ref 4.2–6.3)

## 2014-02-14 ENCOUNTER — Observation Stay: Payer: Self-pay | Admitting: Internal Medicine

## 2014-02-14 LAB — CBC
HCT: 39 % (ref 35.0–47.0)
HGB: 12.1 g/dL (ref 12.0–16.0)
MCH: 26.9 pg (ref 26.0–34.0)
MCHC: 30.9 g/dL — ABNORMAL LOW (ref 32.0–36.0)
MCV: 87 fL (ref 80–100)
Platelet: 304 10*3/uL (ref 150–440)
RBC: 4.48 10*6/uL (ref 3.80–5.20)
RDW: 19.5 % — AB (ref 11.5–14.5)
WBC: 11.2 10*3/uL — ABNORMAL HIGH (ref 3.6–11.0)

## 2014-02-14 LAB — URINALYSIS, COMPLETE
Blood: NEGATIVE
GLUCOSE, UR: NEGATIVE mg/dL (ref 0–75)
Ketone: NEGATIVE
Nitrite: POSITIVE
PH: 7 (ref 4.5–8.0)
RBC,UR: 8 /HPF (ref 0–5)
Specific Gravity: 1.019 (ref 1.003–1.030)
Squamous Epithelial: 2
WBC UR: 1463 /HPF (ref 0–5)

## 2014-02-14 LAB — BASIC METABOLIC PANEL
Anion Gap: 11 (ref 7–16)
BUN: 13 mg/dL (ref 7–18)
CALCIUM: 8.3 mg/dL — AB (ref 8.5–10.1)
Chloride: 99 mmol/L (ref 98–107)
Co2: 23 mmol/L (ref 21–32)
Creatinine: 0.67 mg/dL (ref 0.60–1.30)
EGFR (Non-African Amer.): >60
Glucose: 151 mg/dL — ABNORMAL HIGH (ref 65–99)
OSMOLALITY: 269 (ref 275–301)
POTASSIUM: 3.4 mmol/L — AB (ref 3.5–5.1)
Sodium: 133 mmol/L — ABNORMAL LOW (ref 136–145)

## 2014-02-14 LAB — TROPONIN I

## 2014-02-15 LAB — BASIC METABOLIC PANEL
Anion Gap: 7 (ref 7–16)
BUN: 14 mg/dL (ref 7–18)
CHLORIDE: 99 mmol/L (ref 98–107)
Calcium, Total: 8.5 mg/dL (ref 8.5–10.1)
Co2: 27 mmol/L (ref 21–32)
Creatinine: 0.68 mg/dL (ref 0.60–1.30)
EGFR (African American): >60
EGFR (Non-African Amer.): >60
Glucose: 97 mg/dL (ref 65–99)
Osmolality: 267 (ref 275–301)
Potassium: 3.5 mmol/L (ref 3.5–5.1)
Sodium: 133 mmol/L — ABNORMAL LOW (ref 136–145)

## 2014-02-15 LAB — CBC WITH DIFFERENTIAL/PLATELET
BASOS ABS: 0.1 10*3/uL (ref 0.0–0.1)
Basophil %: 1 %
Eosinophil #: 0.3 10*3/uL (ref 0.0–0.7)
Eosinophil %: 2.6 %
HCT: 36.6 % (ref 35.0–47.0)
HGB: 11.5 g/dL — ABNORMAL LOW (ref 12.0–16.0)
LYMPHS PCT: 19.7 %
Lymphocyte #: 2.1 10*3/uL (ref 1.0–3.6)
MCH: 26.8 pg (ref 26.0–34.0)
MCHC: 31.4 g/dL — AB (ref 32.0–36.0)
MCV: 85 fL (ref 80–100)
Monocyte #: 1.2 x10 3/mm — ABNORMAL HIGH (ref 0.2–0.9)
Monocyte %: 11.6 %
Neutrophil #: 7 10*3/uL — ABNORMAL HIGH (ref 1.4–6.5)
Neutrophil %: 65.1 %
Platelet: 339 10*3/uL (ref 150–440)
RBC: 4.29 10*6/uL (ref 3.80–5.20)
RDW: 19.2 % — ABNORMAL HIGH (ref 11.5–14.5)
WBC: 10.8 10*3/uL (ref 3.6–11.0)

## 2014-02-15 LAB — MAGNESIUM: MAGNESIUM: 1.5 mg/dL — AB

## 2014-02-15 LAB — TSH: THYROID STIMULATING HORM: 2.95 u[IU]/mL

## 2014-02-17 LAB — URINE CULTURE

## 2014-02-19 LAB — CULTURE, BLOOD (SINGLE)

## 2014-03-20 ENCOUNTER — Encounter (HOSPITAL_COMMUNITY): Payer: Self-pay

## 2014-03-20 ENCOUNTER — Emergency Department (HOSPITAL_COMMUNITY): Payer: Medicare PPO

## 2014-03-20 ENCOUNTER — Inpatient Hospital Stay (HOSPITAL_COMMUNITY)
Admission: EM | Admit: 2014-03-20 | Discharge: 2014-03-28 | DRG: 193 | Disposition: A | Discharge status: Skilled Nursing Facility | Payer: Medicare PPO | Attending: Physician Assistant | Admitting: Physician Assistant

## 2014-03-20 DIAGNOSIS — Z90710 Acquired absence of both cervix and uterus: Secondary | ICD-10-CM | POA: Diagnosis not present

## 2014-03-20 DIAGNOSIS — I272 Other secondary pulmonary hypertension: Secondary | ICD-10-CM | POA: Diagnosis present

## 2014-03-20 DIAGNOSIS — Z9841 Cataract extraction status, right eye: Secondary | ICD-10-CM

## 2014-03-20 DIAGNOSIS — K5900 Constipation, unspecified: Secondary | ICD-10-CM | POA: Diagnosis not present

## 2014-03-20 DIAGNOSIS — Z9181 History of falling: Secondary | ICD-10-CM | POA: Diagnosis not present

## 2014-03-20 DIAGNOSIS — N390 Urinary tract infection, site not specified: Secondary | ICD-10-CM | POA: Diagnosis present

## 2014-03-20 DIAGNOSIS — J159 Unspecified bacterial pneumonia: Secondary | ICD-10-CM | POA: Diagnosis present

## 2014-03-20 DIAGNOSIS — Z9842 Cataract extraction status, left eye: Secondary | ICD-10-CM | POA: Diagnosis not present

## 2014-03-20 DIAGNOSIS — Z86718 Personal history of other venous thrombosis and embolism: Secondary | ICD-10-CM

## 2014-03-20 DIAGNOSIS — Z7982 Long term (current) use of aspirin: Secondary | ICD-10-CM | POA: Diagnosis not present

## 2014-03-20 DIAGNOSIS — Z9049 Acquired absence of other specified parts of digestive tract: Secondary | ICD-10-CM | POA: Diagnosis present

## 2014-03-20 DIAGNOSIS — Z86010 Personal history of colonic polyps: Secondary | ICD-10-CM

## 2014-03-20 DIAGNOSIS — E039 Hypothyroidism, unspecified: Secondary | ICD-10-CM | POA: Diagnosis present

## 2014-03-20 DIAGNOSIS — I509 Heart failure, unspecified: Secondary | ICD-10-CM | POA: Diagnosis not present

## 2014-03-20 DIAGNOSIS — R58 Hemorrhage, not elsewhere classified: Secondary | ICD-10-CM | POA: Diagnosis present

## 2014-03-20 DIAGNOSIS — Z885 Allergy status to narcotic agent status: Secondary | ICD-10-CM | POA: Diagnosis not present

## 2014-03-20 DIAGNOSIS — F0390 Unspecified dementia without behavioral disturbance: Secondary | ICD-10-CM | POA: Diagnosis present

## 2014-03-20 DIAGNOSIS — E860 Dehydration: Secondary | ICD-10-CM | POA: Diagnosis present

## 2014-03-20 DIAGNOSIS — E43 Unspecified severe protein-calorie malnutrition: Secondary | ICD-10-CM | POA: Diagnosis present

## 2014-03-20 DIAGNOSIS — R4182 Altered mental status, unspecified: Secondary | ICD-10-CM

## 2014-03-20 DIAGNOSIS — R531 Weakness: Secondary | ICD-10-CM

## 2014-03-20 DIAGNOSIS — Z882 Allergy status to sulfonamides status: Secondary | ICD-10-CM | POA: Diagnosis not present

## 2014-03-20 DIAGNOSIS — I482 Chronic atrial fibrillation: Secondary | ICD-10-CM | POA: Diagnosis not present

## 2014-03-20 DIAGNOSIS — R140 Abdominal distension (gaseous): Secondary | ICD-10-CM

## 2014-03-20 DIAGNOSIS — R52 Pain, unspecified: Secondary | ICD-10-CM

## 2014-03-20 DIAGNOSIS — R21 Rash and other nonspecific skin eruption: Secondary | ICD-10-CM | POA: Diagnosis present

## 2014-03-20 DIAGNOSIS — J449 Chronic obstructive pulmonary disease, unspecified: Secondary | ICD-10-CM | POA: Diagnosis present

## 2014-03-20 DIAGNOSIS — Z66 Do not resuscitate: Secondary | ICD-10-CM | POA: Diagnosis not present

## 2014-03-20 DIAGNOSIS — J189 Pneumonia, unspecified organism: Secondary | ICD-10-CM | POA: Diagnosis present

## 2014-03-20 DIAGNOSIS — D509 Iron deficiency anemia, unspecified: Secondary | ICD-10-CM | POA: Diagnosis not present

## 2014-03-20 DIAGNOSIS — K219 Gastro-esophageal reflux disease without esophagitis: Secondary | ICD-10-CM | POA: Diagnosis not present

## 2014-03-20 DIAGNOSIS — I10 Essential (primary) hypertension: Secondary | ICD-10-CM | POA: Diagnosis not present

## 2014-03-20 DIAGNOSIS — F419 Anxiety disorder, unspecified: Secondary | ICD-10-CM | POA: Diagnosis not present

## 2014-03-20 DIAGNOSIS — M797 Fibromyalgia: Secondary | ICD-10-CM | POA: Diagnosis present

## 2014-03-20 DIAGNOSIS — Z6820 Body mass index (BMI) 20.0-20.9, adult: Secondary | ICD-10-CM | POA: Diagnosis not present

## 2014-03-20 DIAGNOSIS — M1990 Unspecified osteoarthritis, unspecified site: Secondary | ICD-10-CM | POA: Diagnosis present

## 2014-03-20 DIAGNOSIS — E119 Type 2 diabetes mellitus without complications: Secondary | ICD-10-CM

## 2014-03-20 DIAGNOSIS — F329 Major depressive disorder, single episode, unspecified: Secondary | ICD-10-CM | POA: Diagnosis not present

## 2014-03-20 DIAGNOSIS — Z888 Allergy status to other drugs, medicaments and biological substances status: Secondary | ICD-10-CM | POA: Diagnosis not present

## 2014-03-20 DIAGNOSIS — R339 Retention of urine, unspecified: Secondary | ICD-10-CM | POA: Insufficient documentation

## 2014-03-20 DIAGNOSIS — I4891 Unspecified atrial fibrillation: Secondary | ICD-10-CM | POA: Diagnosis present

## 2014-03-20 LAB — CBC WITH DIFFERENTIAL/PLATELET
BASOS ABS: 0 10*3/uL (ref 0.0–0.1)
BASOS PCT: 0 % (ref 0–1)
EOS ABS: 0.2 10*3/uL (ref 0.0–0.7)
EOS PCT: 2 % (ref 0–5)
HCT: 41.8 % (ref 36.0–46.0)
Hemoglobin: 13.8 g/dL (ref 12.0–15.0)
LYMPHS ABS: 2.2 10*3/uL (ref 0.7–4.0)
Lymphocytes Relative: 31 % (ref 12–46)
MCH: 27.5 pg (ref 26.0–34.0)
MCHC: 33 g/dL (ref 30.0–36.0)
MCV: 83.3 fL (ref 78.0–100.0)
MONOS PCT: 11 % (ref 3–12)
Monocytes Absolute: 0.8 10*3/uL (ref 0.1–1.0)
NEUTROS PCT: 56 % (ref 43–77)
Neutro Abs: 3.9 10*3/uL (ref 1.7–7.7)
Platelets: 250 10*3/uL (ref 150–400)
RBC: 5.02 MIL/uL (ref 3.87–5.11)
RDW: 18.8 % — ABNORMAL HIGH (ref 11.5–15.5)
WBC: 7.2 10*3/uL (ref 4.0–10.5)

## 2014-03-20 LAB — URINALYSIS, ROUTINE W REFLEX MICROSCOPIC
BILIRUBIN URINE: NEGATIVE
GLUCOSE, UA: NEGATIVE mg/dL
Hgb urine dipstick: NEGATIVE
Ketones, ur: 15 mg/dL — AB
NITRITE: NEGATIVE
PH: 7 (ref 5.0–8.0)
Protein, ur: 100 mg/dL — AB
Specific Gravity, Urine: 1.025 (ref 1.005–1.030)
Urobilinogen, UA: 1 mg/dL (ref 0.0–1.0)

## 2014-03-20 LAB — COMPREHENSIVE METABOLIC PANEL
ALK PHOS: 116 U/L (ref 39–117)
ALT: 18 U/L (ref 0–35)
AST: 33 U/L (ref 0–37)
Albumin: 3.5 g/dL (ref 3.5–5.2)
Anion gap: 14 (ref 5–15)
BUN: 18 mg/dL (ref 6–23)
CHLORIDE: 101 meq/L (ref 96–112)
CO2: 23 mmol/L (ref 19–32)
CREATININE: 0.7 mg/dL (ref 0.50–1.10)
Calcium: 9.2 mg/dL (ref 8.4–10.5)
GFR calc non Af Amer: 76 mL/min — ABNORMAL LOW (ref >90)
GFR, EST AFRICAN AMERICAN: 88 mL/min — AB (ref >90)
GLUCOSE: 113 mg/dL — AB (ref 70–99)
POTASSIUM: 3.8 mmol/L (ref 3.5–5.1)
Sodium: 138 mmol/L (ref 135–145)
Total Bilirubin: 1.3 mg/dL — ABNORMAL HIGH (ref 0.3–1.2)
Total Protein: 6.4 g/dL (ref 6.0–8.3)

## 2014-03-20 LAB — BASIC METABOLIC PANEL
Anion gap: 11 (ref 5–15)
BUN: 13 mg/dL (ref 6–23)
CALCIUM: 9 mg/dL (ref 8.4–10.5)
CO2: 25 mmol/L (ref 19–32)
Chloride: 101 mEq/L (ref 96–112)
Creatinine, Ser: 0.63 mg/dL (ref 0.50–1.10)
GFR, EST NON AFRICAN AMERICAN: 78 mL/min — AB (ref >90)
GLUCOSE: 86 mg/dL (ref 70–99)
POTASSIUM: 3.4 mmol/L — AB (ref 3.5–5.1)
SODIUM: 137 mmol/L (ref 135–145)

## 2014-03-20 LAB — URINE MICROSCOPIC-ADD ON

## 2014-03-20 LAB — I-STAT TROPONIN, ED: Troponin i, poc: 0.01 ng/mL (ref 0.00–0.08)

## 2014-03-20 LAB — TSH: TSH: 3.291 u[IU]/mL (ref 0.350–4.500)

## 2014-03-20 LAB — CBG MONITORING, ED
GLUCOSE-CAPILLARY: 83 mg/dL (ref 70–99)
Glucose-Capillary: 108 mg/dL — ABNORMAL HIGH (ref 70–99)

## 2014-03-20 LAB — VALPROIC ACID LEVEL

## 2014-03-20 LAB — I-STAT CG4 LACTIC ACID, ED: LACTIC ACID, VENOUS: 2.53 mmol/L — AB (ref 0.5–2.2)

## 2014-03-20 LAB — MRSA PCR SCREENING: MRSA by PCR: NEGATIVE

## 2014-03-20 LAB — TROPONIN I: Troponin I: <0.03 ng/mL (ref <0.031)

## 2014-03-20 MED ORDER — POTASSIUM CHLORIDE IN NACL 20-0.9 MEQ/L-% IV SOLN
INTRAVENOUS | Status: AC
  Administered 2014-03-20 – 2014-03-21 (×2): via INTRAVENOUS
  Filled 2014-03-20 (×2): qty 1000

## 2014-03-20 MED ORDER — LEVOTHYROXINE SODIUM 75 MCG PO TABS
75.0000 ug | ORAL_TABLET | Freq: Every day | ORAL | Status: DC
  Administered 2014-03-21 – 2014-03-28 (×8): 75 ug via ORAL
  Filled 2014-03-20 (×9): qty 1

## 2014-03-20 MED ORDER — POTASSIUM CHLORIDE IN NACL 20-0.9 MEQ/L-% IV SOLN
INTRAVENOUS | Status: DC
  Filled 2014-03-20: qty 1000

## 2014-03-20 MED ORDER — SODIUM CHLORIDE 0.9 % IV BOLUS (SEPSIS)
500.0000 mL | Freq: Once | INTRAVENOUS | Status: AC
  Administered 2014-03-20: 500 mL via INTRAVENOUS

## 2014-03-20 MED ORDER — ENOXAPARIN SODIUM 30 MG/0.3ML ~~LOC~~ SOLN
30.0000 mg | SUBCUTANEOUS | Status: DC
  Administered 2014-03-20 – 2014-03-27 (×8): 30 mg via SUBCUTANEOUS
  Filled 2014-03-20 (×9): qty 0.3

## 2014-03-20 MED ORDER — LEVOFLOXACIN IN D5W 750 MG/150ML IV SOLN
750.0000 mg | Freq: Once | INTRAVENOUS | Status: AC
  Administered 2014-03-20: 750 mg via INTRAVENOUS
  Filled 2014-03-20: qty 150

## 2014-03-20 MED ORDER — TIOTROPIUM BROMIDE MONOHYDRATE 18 MCG IN CAPS
18.0000 ug | ORAL_CAPSULE | Freq: Every day | RESPIRATORY_TRACT | Status: DC
  Administered 2014-03-21 – 2014-03-27 (×2): 18 ug via RESPIRATORY_TRACT
  Filled 2014-03-20: qty 5

## 2014-03-20 MED ORDER — SODIUM CHLORIDE 0.9 % IV SOLN: INTRAVENOUS | Status: AC

## 2014-03-20 MED ORDER — METOPROLOL TARTRATE 50 MG PO TABS
50.0000 mg | ORAL_TABLET | Freq: Two times a day (BID) | ORAL | Status: DC
  Administered 2014-03-21 – 2014-03-28 (×14): 50 mg via ORAL
  Filled 2014-03-20 (×17): qty 1

## 2014-03-20 MED ORDER — SENNOSIDES-DOCUSATE SODIUM 8.6-50 MG PO TABS
2.0000 | ORAL_TABLET | Freq: Every day | ORAL | Status: DC
  Administered 2014-03-22 – 2014-03-27 (×6): 2 via ORAL
  Filled 2014-03-20 (×8): qty 2

## 2014-03-20 MED ORDER — SERTRALINE HCL 50 MG PO TABS
50.0000 mg | ORAL_TABLET | Freq: Every day | ORAL | Status: DC
  Administered 2014-03-21 – 2014-03-22 (×2): 50 mg via ORAL
  Filled 2014-03-20 (×3): qty 1

## 2014-03-20 MED ORDER — FAMOTIDINE 20 MG PO TABS
20.0000 mg | ORAL_TABLET | Freq: Two times a day (BID) | ORAL | Status: DC
  Administered 2014-03-21 – 2014-03-28 (×13): 20 mg via ORAL
  Filled 2014-03-20 (×17): qty 1

## 2014-03-20 NOTE — ED Notes (Signed)
Lactic acid results given to Dr. Tamera Punt

## 2014-03-20 NOTE — ED Notes (Signed)
Daughter would like to speak to someone regarding mothers care- [phone]- daughter currently in [place].  Worried about mothers dementia, would like to see about sitter?

## 2014-03-20 NOTE — ED Notes (Signed)
Daughter would like to speak to someone regarding mothers care- [phone]- daughter currently in [place].  Worried about mothers dementia, would like to see about sitter?
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
## 2014-03-20 NOTE — ED Provider Notes (Signed)
CSN: [id number]     Arrival date & time 03/20/14  1223 History   First MD Initiated Contact with Patient 03/20/14 1224     Chief Complaint  Patient presents with  . Weakness     (Consider location/radiation/quality/duration/timing/severity/associated sxs/prior Treatment) HPI [name] is a 81 y.o. female with hx of htn, DM, anemia, CPOD, prior DVT, fibromyalgia, CHF, presents to ED with complaint of weakness. Pt coming from nursing home. Was found to be weaker than normal for the last 3 days. Was noted to have new onset of garbled speech and right facial droop. Pt denies any complaints. She is unable to provide any hx due to her dementia.   Past Medical History  Diagnosis Date  . Hypertension   . Diabetes mellitus   . Hypothyroidism   . Vertigo   . Anemia   . Osteoarthritis   . GERD (gastroesophageal reflux disease)   . Diverticulosis   . Depression   . H/O: hysterectomy     at age 44  . COPD (chronic obstructive pulmonary disease)   . History of DVT (deep vein thrombosis)   . Anxiety   . Dyspnea   . Enlarged heart   . Fibromyalgia   . Degenerative joint disease   . Degenerative disk disease   . Tremor   . Falls   . Iron deficiency anemia, unspecified 07/15/2012  . CHF (congestive heart failure)   . Cancer     basal cell  . Atrial fibrillation 08/21/2013  . HIATAL HERNIA WITH REFLUX 10/13/2006    Qualifier: Diagnosis of  By: Silvio Pate MD, Baird Cancer   . COPD with chronic bronchitis 10/13/2006    Never a primary smoker, but secondhand smoke from husband by history. Emphysema and chronic bronchitis, mild.    . Senile dementia, uncomplicated 2/84/1324    Qualifier: Diagnosis of  By: Copland MD, Frederico Hamman    . VERTIGO 10/02/2008    Qualifier: Diagnosis of  By: Lorelei Pont MD, Frederico Hamman    . TREMOR, ESSENTIAL 10/13/2006    Qualifier: Diagnosis of  By: Silvio Pate MD, Baird Cancer   . POSITIVE PPD 06/02/2007    Qualifier: Diagnosis of  By: Julien Girt CMA, Marliss Czar    . COLONIC POLYPS, HX OF  10/13/2006    Qualifier: Diagnosis of  By: Silvio Pate MD, Baird Cancer    Past Surgical History  Procedure Laterality Date  . Cholecystectomy  1944  . Appendectomy  1952  . Ovarian cyst removal  1982    secondary ventral hematoma  . Bladder tack  1997  . Cataract extraction, bilateral    . Kyphosis surgery    . Total abdominal hysterectomy    . Salpingoophorectomy      unilateral salpingo-oophorectomy  . Orif right hip     Family History  Problem Relation Age of Onset  . Stroke Mother   . Cancer Father     Pancreatic Cancer  . Kidney disease Sister   . Heart disease Brother   . Stroke Brother   . Heart disease Brother    History  Substance Use Topics  . Smoking status: Never Smoker   . Smokeless tobacco: Never Used  . Alcohol Use: No   OB History    No data available     Review of Systems  Unable to perform ROS: Dementia  Constitutional: Positive for fatigue. Negative for fever.  Skin: Negative for rash.  Neurological: Positive for weakness. Negative for dizziness.  All other systems reviewed and are negative.  Allergies  Aricept; Depakote; Hydrocodone-acetaminophen; Metoprolol; Nitrofurantoin; and Sulfonamide derivatives  Home Medications   Prior to Admission medications   Medication Sig Start Date End Date Taking? Authorizing Provider  acetaminophen (TYLENOL) 325 MG tablet Take 650 mg by mouth every 4 (four) hours as needed for mild pain.   Yes Historical Provider, MD  aspirin 81 MG chewable tablet Chew 81 mg by mouth daily.   Yes Historical Provider, MD  cetirizine (ZYRTEC) 10 MG tablet Take 10 mg by mouth daily. For 5 days Start date 03/19/14   Yes Historical Provider, MD  clorazepate (TRANXENE) 3.75 MG tablet Take 1 tablet (3.75 mg total) by mouth at bedtime. 10/30/13  Yes Tiffany L Reed, DO  famotidine (PEPCID) 20 MG tablet Take 20 mg by mouth 2 (two) times daily.   Yes Historical Provider, MD  levothyroxine (SYNTHROID, LEVOTHROID) 75 MCG tablet Take 75 mcg  by mouth daily.     Yes Historical Provider, MD  magnesium oxide (MAG-OX) 400 MG tablet Take 400 mg by mouth daily.   Yes Historical Provider, MD  metFORMIN (GLUCOPHAGE) 500 MG tablet Take 500 mg by mouth 2 (two) times daily with a meal.   Yes Historical Provider, MD  metoprolol (LOPRESSOR) 50 MG tablet Take 50 mg by mouth 2 (two) times daily.   Yes Historical Provider, MD  sennosides-docusate sodium (SENOKOT-S) 8.6-50 MG tablet Take 2 tablets by mouth at bedtime.   Yes Historical Provider, MD  sertraline (ZOLOFT) 50 MG tablet Take 50 mg by mouth daily.     Yes Historical Provider, MD  tiotropium (SPIRIVA) 18 MCG inhalation capsule Place 1 capsule (18 mcg total) into inhaler and inhale daily. 10/12/13  Yes Deneise Lever, MD  Vitamin D, Ergocalciferol, (DRISDOL) 50000 UNITS CAPS capsule Take 50,000 Units by mouth every 7 (seven) days.   Yes Historical Provider, MD  furosemide (LASIX) 20 MG tablet Take 1 tablet (20 mg total) by mouth 2 (two) times daily as needed. For weight gain of 4 pounds or more daily 01/19/14   Minna Merritts, MD  hydrOXYzine (ATARAX/VISTARIL) 10 MG tablet Take 10 mg by mouth every 8 (eight) hours as needed.     Historical Provider, MD  ibuprofen (ADVIL,MOTRIN) 200 MG tablet Take 200 mg by mouth every 6 (six) hours as needed.     Historical Provider, MD  Multiple Vitamin (MULTIVITAMIN PO) Take 1 tablet by mouth daily.      Historical Provider, MD  omeprazole (PRILOSEC) 20 MG capsule Take 20 mg by mouth daily.    Historical Provider, MD  potassium chloride (K-DUR) 10 MEQ tablet Take 1 tablet (10 mEq total) by mouth 2 (two) times daily as needed. To take with lasix 01/19/14   Minna Merritts, MD  ranitidine (ZANTAC) 75 MG tablet Take 75 mg by mouth daily as needed.     Historical Provider, MD  tapentadol (NUCYNTA) 50 MG TABS tablet Take one tablet by mouth every 6 hours as needed for severe pain 11/14/13   Blanchie Serve, MD  traMADol (ULTRAM) 50 MG tablet Take 50 mg by mouth  every 6 (six) hours as needed.  12/20/13   Historical Provider, MD   BP 158/88 mmHg  Pulse 88  Temp(Src) 97.4 F (36.3 C) (Rectal)  Resp 22  SpO2 98% Physical Exam  Constitutional: She is oriented to person, place, and time. She appears well-developed and well-nourished. No distress.  HENT:  Head: Normocephalic and atraumatic.  Eyes: Conjunctivae and EOM are normal. Pupils are equal, round,  and reactive to light.  Neck: Normal range of motion. Neck supple.  Cardiovascular: Normal rate, regular rhythm and normal heart sounds.   Pulmonary/Chest: Effort normal and breath sounds normal. No respiratory distress. She has no wheezes. She has no rales.  Abdominal: Soft. Bowel sounds are normal. She exhibits no distension. There is no tenderness. There is no rebound.  Musculoskeletal: She exhibits no edema.  Neurological: She is alert and oriented to person, place, and time.  Oriented to self only. Follow simple commands. Unable to do finger to nose and heel to shin. 5/5 and equal upper and le strength bilaterally  Skin: Skin is warm and dry.  Psychiatric: She has a normal mood and affect. Her behavior is normal.  Nursing note and vitals reviewed.   ED Course  Procedures (including critical care time) Labs Review Labs Reviewed  CBC WITH DIFFERENTIAL - Abnormal; Notable for the following:    RDW 18.8 (*)    All other components within normal limits  COMPREHENSIVE METABOLIC PANEL - Abnormal; Notable for the following:    Glucose, Bld 113 (*)    Total Bilirubin 1.3 (*)    GFR calc non Af Amer 76 (*)    GFR calc Af Amer 88 (*)    All other components within normal limits  URINALYSIS, ROUTINE W REFLEX MICROSCOPIC - Abnormal; Notable for the following:    Color, Urine AMBER (*)    APPearance CLOUDY (*)    Ketones, ur 15 (*)    Protein, ur 100 (*)    Leukocytes, UA TRACE (*)    All other components within normal limits  URINE MICROSCOPIC-ADD ON - Abnormal; Notable for the following:     Squamous Epithelial / LPF FEW (*)    Bacteria, UA FEW (*)    All other components within normal limits  CBG MONITORING, ED - Abnormal; Notable for the following:    Glucose-Capillary 108 (*)    All other components within normal limits  I-STAT CG4 LACTIC ACID, ED - Abnormal; Notable for the following:    Lactic Acid, Venous 2.53 (*)    All other components within normal limits  VALPROIC ACID LEVEL  I-STAT TROPOININ, ED    Imaging Review Dg Chest 2 View  03/20/2014   CLINICAL DATA:  Weakness and confusion  EXAM: CHEST  2 VIEW  COMPARISON:  09/09/2012  FINDINGS: Cardiac shadow remains enlarged. Aortic calcifications are again noted. Diffuse interstitial changes are seen. Some focal infiltrate is noted in the lateral left lung base. No sizable effusion is noted. Changes of prior vertebral augmentation are seen. Changes of prior right humeral fracture are seen.  IMPRESSION: Left basilar infiltrate with superimposed more chronic interstitial change.   Electronically Signed   By: Inez Catalina M.D.   On: 03/20/2014 13:49   Ct Head Wo Contrast  03/20/2014   CLINICAL DATA:  Altered mental status for 3 days. New onset dysarthria and right facial droop  EXAM: CT HEAD WITHOUT CONTRAST  TECHNIQUE: Contiguous axial images were obtained from the base of the skull through the vertex without intravenous contrast.  COMPARISON:  March 11, 2011  FINDINGS: Moderate diffuse atrophy is stable. There is no intracranial mass, hemorrhage, extra-axial fluid collection, or midline shift. There is patchy small vessel disease throughout the centra semiovale bilaterally, stable. No acute infarct apparent. No new gray-white compartment lesion. The bony calvarium appears intact. The mastoid air cells are clear.  IMPRESSION: Atrophy with extensive periventricular small vessel disease. No intracranial mass, hemorrhage, or  acute appearing infarct.   Electronically Signed   By: Lowella Grip M.D.   On: 03/20/2014 13:42     EKG  Interpretation   Date/Time:  Tuesday March 20 2014 12:33:12 EST Ventricular Rate:  100 PR Interval:  178 QRS Duration: 97 QT Interval:  390 QTC Calculation: 503 R Axis:   164 Text Interpretation:  Atrial fibrillation Multiple premature complexes,  vent  Anterior infarct, old Confirmed by BELFI  MD, MELANIE ([id number]) on  03/20/2014 12:55:04 PM      MDM   Final diagnoses:  Altered mental status, unspecified altered mental status type  HCAP (healthcare-associated pneumonia)    Patient here with increased weakness over last 3 days, new garbled speech, left facial weakness. No facial drooping on my exam. Patient is demented, oriented to self only. Will get labs, UA, CT head, chest x-ray. Patient is hypertensive otherwise normal vital signs.  3:20 PM Possible pneumonia on CXR. Will start on levaquin.  Lactic acid slightly up ato 2.53. WBC normal. UA contaminated, will send for culture. Pt will need admission for HCAP and further evaluation of her weakness.   3:45 PM Daughter updated.   4:17 PM Discussed with internal medicine teaching service, they will admit patient.  Filed Vitals:   03/20/14 1505 03/20/14 1522 03/20/14 1554 03/20/14 1600  BP:  163/87 171/97 163/98  Pulse:  95 104 106  Temp: 97.4 F (36.3 C)     TempSrc: Rectal     Resp:  18 23 27   SpO2:  98% 98% 98%     Renold Genta, PA-C 03/20/14 [place], MD 03/21/14 [phone]

## 2014-03-20 NOTE — H&P (Signed)
Date: 03/20/2014               Patient Name:  [name] MRN: [id number]  DOB: [birth year] Age / Sex: 81 y.o., female   PCP: [name] Pink, MD         Medical Service: Internal Medicine Teaching Service         Attending Physician: Dr. Axel Filler, MD    First Contact: Dr. Charlott [name] Pager: [phone]  Second Contact: Dr. Bing [name] Pager: [phone]       After Hours (After 5p/  First Contact Pager: [phone]  weekends / holidays): Second Contact Pager: [phone]   Chief Complaint: weakness, R facial droop, garbled speech  History of Present Illness: [name] is an 81 year old with multiple medical problems, including dementia, CHF [EF 20-25%, September 2011], chronic a-fib, who presents to the ED today from SNF who presents for altered mental status. History is collected primarily from chart review and caregivers [her friend [name] at bedside, [name] by phone, nursing home by phone].   At her SNF, she was found to have a UTI and treated with amoxicillin x 5 days ago. During this time, she was also started on Depakote for a mood disorder. Given her worsening confusion and current symptoms, she was referred to the ED for further evaluation. Per her HCPOA, she sustained a L hip fracture in early December after which she was transitioned to SNF and has been declining since. At baseline, she interacts with individuals for a few minutes before forgetting who they are or why they are there. Prior to December, she was living by herself with individuals staying with her around the clock to help minimize her fall risk. She had a prior hip fracture on the right side for which she underwent ORIF in August and then rehab.   In the ED, she received Levaquin x 1 for presumed HCAP per CXR findings concerning for L infiltrate.   Meds: Current Facility-Administered Medications  Medication Dose Route Frequency Provider Last Rate Last Dose  . levofloxacin (LEVAQUIN) IVPB 750  mg  750 mg Intravenous Once [name] A Kirichenko, PA-C 100 mL/hr at 03/20/14 1525 750 mg at 03/20/14 1525   Current Outpatient Prescriptions  Medication Sig Dispense Refill  . acetaminophen (TYLENOL) 325 MG tablet Take 650 mg by mouth every 4 (four) hours as needed for mild pain.    Marland Kitchen aspirin 81 MG chewable tablet Chew 81 mg by mouth daily.    . cetirizine (ZYRTEC) 10 MG tablet Take 10 mg by mouth daily. For 5 days Start date 03/19/14    . clorazepate (TRANXENE) 3.75 MG tablet Take 1 tablet (3.75 mg total) by mouth at bedtime. 30 tablet 0  . famotidine (PEPCID) 20 MG tablet Take 20 mg by mouth 2 (two) times daily.    Marland Kitchen levothyroxine (SYNTHROID, LEVOTHROID) 75 MCG tablet Take 75 mcg by mouth daily.      . magnesium oxide (MAG-OX) 400 MG tablet Take 400 mg by mouth daily.    . metFORMIN (GLUCOPHAGE) 500 MG tablet Take 500 mg by mouth 2 (two) times daily with a meal.    . metoprolol (LOPRESSOR) 50 MG tablet Take 50 mg by mouth 2 (two) times daily.    . sennosides-docusate sodium (SENOKOT-S) 8.6-50 MG tablet Take 2 tablets by mouth at bedtime.    . sertraline (ZOLOFT) 50 MG tablet Take 50 mg by mouth daily.      Marland Kitchen tiotropium (SPIRIVA) 18 MCG  inhalation capsule Place 1 capsule (18 mcg total) into inhaler and inhale daily. 30 capsule prn  . Vitamin D, Ergocalciferol, (DRISDOL) 50000 UNITS CAPS capsule Take 50,000 Units by mouth every 7 (seven) days.    . furosemide (LASIX) 20 MG tablet Take 1 tablet (20 mg total) by mouth 2 (two) times daily as needed. For weight gain of 4 pounds or more daily 60 tablet 6  . hydrOXYzine (ATARAX/VISTARIL) 10 MG tablet Take 10 mg by mouth every 8 (eight) hours as needed.     Marland Kitchen ibuprofen (ADVIL,MOTRIN) 200 MG tablet Take 200 mg by mouth every 6 (six) hours as needed.     . Multiple Vitamin (MULTIVITAMIN PO) Take 1 tablet by mouth daily.      Marland Kitchen omeprazole (PRILOSEC) 20 MG capsule Take 20 mg by mouth daily.    . potassium chloride (K-DUR) 10 MEQ tablet Take 1 tablet (10  mEq total) by mouth 2 (two) times daily as needed. To take with lasix 60 tablet 6  . ranitidine (ZANTAC) 75 MG tablet Take 75 mg by mouth daily as needed.     . tapentadol (NUCYNTA) 50 MG TABS tablet Take one tablet by mouth every 6 hours as needed for severe pain 120 tablet 0  . traMADol (ULTRAM) 50 MG tablet Take 50 mg by mouth every 6 (six) hours as needed.       Allergies: Allergies as of 03/20/2014 - Review Complete 03/20/2014  Allergen Reaction Noted  . Aricept [donepezil hydrochloride]  09/24/2009  . Depakote [divalproex sodium]  03/20/2014  . Hydrocodone-acetaminophen    . Metoprolol    . Nitrofurantoin  10/10/2008  . Sulfonamide derivatives  04/06/2006   Past Medical History  Diagnosis Date  . Hypertension   . Diabetes mellitus   . Hypothyroidism   . Vertigo   . Anemia   . Osteoarthritis   . GERD (gastroesophageal reflux disease)   . Diverticulosis   . Depression   . H/O: hysterectomy     at age 22  . COPD (chronic obstructive pulmonary disease)   . History of DVT (deep vein thrombosis)   . Anxiety   . Dyspnea   . Enlarged heart   . Fibromyalgia   . Degenerative joint disease   . Degenerative disk disease   . Tremor   . Falls   . Iron deficiency anemia, unspecified 07/15/2012  . CHF (congestive heart failure)   . [name]     basal cell  . Atrial fibrillation 08/21/2013  . HIATAL HERNIA WITH REFLUX 10/13/2006    Qualifier: Diagnosis of  By: [name] Pate MD, [name]   . COPD with chronic bronchitis 10/13/2006    Never a primary smoker, but secondhand smoke from husband by history. Emphysema and chronic bronchitis, mild.    . Senile dementia, uncomplicated 9/92/4268    Qualifier: Diagnosis of  By: Copland MD, [name] Hamman    . VERTIGO 10/02/2008    Qualifier: Diagnosis of  By: [name] Pont MD, [name] Hamman    . TREMOR, ESSENTIAL 10/13/2006    Qualifier: Diagnosis of  By: [name] Pate MD, [name]   . POSITIVE PPD 06/02/2007    Qualifier: Diagnosis of  By: [name] CMA, [name]    .  COLONIC POLYPS, HX OF 10/13/2006    Qualifier: Diagnosis of  By: [name] Pate MD, [name]    Past Surgical History  Procedure Laterality Date  . Cholecystectomy  1944  . Appendectomy  1952  . Ovarian cyst removal  1982    secondary ventral  hematoma  . Bladder tack  1997  . Cataract extraction, bilateral    . Kyphosis surgery    . Total abdominal hysterectomy    . Salpingoophorectomy      unilateral salpingo-oophorectomy  . Orif right hip     Family History  Problem Relation Age of Onset  . Stroke Mother   . [name] Father     Pancreatic [name]  . Kidney disease Sister   . Heart disease Brother   . Stroke Brother   . Heart disease Brother    History   Social History  . Marital Status: Widowed    Spouse Name: N/A    Number of Children: N/A  . Years of Education: N/A   Occupational History  . Not on file.   Social History Main Topics  . Smoking status: Never Smoker   . Smokeless tobacco: Never Used  . Alcohol Use: No  . Drug Use: No  . Sexual Activity: No   Other Topics Concern  . Not on file   Social History Narrative    Review of Systems: Review of Systems  Unable to perform ROS: dementia     Physical Exam: Blood pressure 163/98, pulse 106, temperature 97.4 F (36.3 C), temperature source Rectal, resp. rate 27, SpO2 98 %.  General: resting in bed, NAD HEENT: PERRL, EOMI, no scleral icterus, oropharynx clear Cardiac: RRR, no rubs, murmurs or gallops Pulm: clear to auscultation bilaterally, no wheezes, rales, or rhonchi Abd: no tenderness to palpation while distracted, BS present Ext: warm and well perfused, covered in bruises in bilateral LE Neuro: oriented to person [Deliliah Minervini] but not to place or year   Lab results: Basic Metabolic Panel:  Recent Labs  03/20/14 1251  NA 138  K 3.8  CL 101  CO2 23  GLUCOSE 113*  BUN 18  CREATININE 0.70  CALCIUM 9.2   Liver Function Tests:  Recent Labs  03/20/14 1251  AST 33  ALT 18  ALKPHOS  116  BILITOT 1.3*  PROT 6.4  ALBUMIN 3.5   CBC:  Recent Labs  03/20/14 1251  WBC 7.2  NEUTROABS 3.9  HGB 13.8  HCT 41.8  MCV 83.3  PLT 250   CBG:  Recent Labs  03/20/14 1304  GLUCAP 108*   Urinalysis:  Recent Labs  03/20/14 1300  COLORURINE AMBER*  LABSPEC 1.025  PHURINE 7.0  GLUCOSEU NEGATIVE  HGBUR NEGATIVE  BILIRUBINUR NEGATIVE  KETONESUR 15*  PROTEINUR 100*  UROBILINOGEN 1.0  NITRITE NEGATIVE  LEUKOCYTESUR TRACE*    Imaging results:  Dg Chest 2 View  03/20/2014   CLINICAL DATA:  Weakness and confusion  EXAM: CHEST  2 VIEW  COMPARISON:  09/09/2012  FINDINGS: Cardiac shadow remains enlarged. Aortic calcifications are again noted. Diffuse interstitial changes are seen. Some focal infiltrate is noted in the lateral left lung base. No sizable effusion is noted. Changes of prior vertebral augmentation are seen. Changes of prior right humeral fracture are seen.  IMPRESSION: Left basilar infiltrate with superimposed more chronic interstitial change.   Electronically Signed   By: Inez Catalina M.D.   On: 03/20/2014 13:49   Ct Head Wo Contrast  03/20/2014   CLINICAL DATA:  Altered mental status for 3 days. New onset dysarthria and right facial droop  EXAM: CT HEAD WITHOUT CONTRAST  TECHNIQUE: Contiguous axial images were obtained from the base of the skull through the vertex without intravenous contrast.  COMPARISON:  March 11, 2011  FINDINGS: Moderate diffuse atrophy is stable.  There is no intracranial mass, hemorrhage, extra-axial fluid collection, or midline shift. There is patchy small vessel disease throughout the centra semiovale bilaterally, stable. No acute infarct apparent. No new gray-white compartment lesion. The bony calvarium appears intact. The mastoid air cells are clear.  IMPRESSION: Atrophy with extensive periventricular small vessel disease. No intracranial mass, hemorrhage, or acute appearing infarct.   Electronically Signed   By: Lowella Grip M.D.    On: 03/20/2014 13:42    Other results: EKG: Reviewed and compared with 01/19/14 Tachycardic though irregular rate Low electrical voltage   Assessment & Plan by Problem:  Altered mental status: Initial symptoms concerning for TIA/CVA though head CT was unremarkable for an acute process. Per caregivers, she has been progressively declining in her baseline function and is difficult to parse apart her current presentation. UA with few bacteria and trace WBC which is suggestive of UTI. Infiltrate on CXR is stable from prior films making HCAP less likely. BMET/CBC also unremarkable for acute abnormalities. Lactate elevated at 2.5 in the ED however which does raise concern for dehydration. Troponin negative x 1 in the ED. -Order sitter for fall risk -Check urine culture -Check RN swallow screen & order diet accordingly -Give NS @ 100cc/hr x 12 hour  Chronic a-fib: Last seen by Dr. Rockey Situ [Cardiology] on 01/19/14 at which time anticoagulation was deferred given her fall risk. In the ED, HR varied though did go to >100. Echo with EF 55-60% with mild LVH, L atrial enlargement, pulmonary HTN  in September 2011. Per last cardiologist's note, she is on Lasix 20mg  BID though every other day.  -Continue metoprolol 50mg  BID  Hypothyroidism: Continue Synthroid 18mcg.  COPD: Continue Spiriva   GERD: Continue Pepcid   Constipation: Continue Senokot-S.  Depression: Continue Zoloft.  #FEN:  -Diet: NPO  #DVT prophylaxis: Lovenox  #CODE STATUS: DNR/DNI -Defer to [place] [phone] for decisions -Confirmed on admission that patient would want to be kept comfortable should she decline further  Dispo: Disposition is deferred at this time, awaiting improvement of current medical problems.   The patient does have a current PCP [name] Pink, MD) and does not need an [facility] hospital follow-up appointment after discharge.  The patient does have transportation limitations that  hinder transportation to clinic appointments.  Signed: Charlott Rakes, MD 03/20/2014, 4:39 PM

## 2014-03-20 NOTE — Progress Notes (Signed)
Pt arrived to unit alert, crying and oriented self. Oriented to room, unit, and staff.  Bed in lowest position and call bell is within reach. Will continue to monitor.

## 2014-03-20 NOTE — ED Notes (Signed)
Patient transported to CT 

## 2014-03-20 NOTE — Progress Notes (Signed)
Received report from [place], [place] in ED.

## 2014-03-20 NOTE — H&P (Signed)
Date: 03/20/2014               Patient Name:  [name] MRN: [id number]  DOB: 03-12-1924 Age / Sex: 81 y.o., female   PCP: Maryland Pink, MD              Medical Service: Internal Medicine Teaching Service              Attending Physician: Dr. Malvin Johns, MD    First Contact: Sherilyn Banker, MS3 Pager: [phone]  Second Contact: Dr. Posey Pronto Pager: [phone]  Third Contact Dr.  Karalee Height: [phone]       After Hours (After 5p/  First Contact Pager: [phone]  weekends / holidays): Second Contact Pager: [phone]   Chief Complaint: weakness  History of Present Illness: [name] is a 81 yo female with a PMHx of Atrial Fibrillation, CHF, dementia, hypothyroidism, anemia, GERD, depression, COPD, degenerative joint disease with several falls and fractures who presents today from a nursing home with weakness (as reported by nursing home). She is having difficulty forming words and is confused. She denies abdominal pain, leg pain. She says that she feels dizzy on occasion. Her answers to most questions are nonsensible. Pt is unable to provide a good history due to dementia.  According to a family friend, [name], her cognition is declined from baseline. She last saw her two weekends ago and she was confused then, but not as much as she is currently. Her friend says that at her baseline, she is very up to date with current events and can carry on great conversation. She thinks that she started to decline after her last fracture (in early December).   According to a nurse at [facility] [place] [phone]), she has has had a general decline in cognitive function since she was admitted on 02/20/2014 after sustaining a L hip facture. However, starting over the weekend (1/15), she has had an altered mental status. Normally, she would feed herself, take herself to the bathroom, carry on conversations (although they were sometimes nonsensical),  but yesterday the staff had to feed hear and she would  barely eat. She did not recognize staff members and was whimpering, crying, and incoherent. She alternates between being continent and incontinent since she came in. The nurse did not see any documented fevers, diarrhea, vomiting. A PA yesterday noted that she was lethargic and encouraged the staff to push fluids. She was started on 10 mg zyrtec daily for 5 days. She had 1 dose yesterday. They also noted that she had worsening facial edema yesterday and mucous membranes that were dry and pale in color.   She was treated for a UTI a week ago with Amoxicillin. She was started on 125 mg Depakote sprinkles on 1/12 for behavior management (would start sundowning around 5-6 pm), but it was D/Ced on 1/18.  She had a rash on her back starting on 1/13 that was thought to be a reaction to amoxicillin or depakote, but it was noted to have improved yesterday. They have had to crush her medications and in put it in her food as she is no longer swallowing pills.  She had surgery on 02/01/14 for a hip fracture. She was put on Lovenox. On 12/25, her Lovenox was decreased to qd then it was D/Ced on 1/14. She has had a history of falls with 3 significant  Fractures. She has broken her hip twice.  She was given Levaquin IV 750 mg in ED due to CXR that was  concerning for pneumonia .  Meds: No current facility-administered medications for this encounter.   Current Outpatient Prescriptions  Medication Sig Dispense Refill  . acetaminophen (TYLENOL) 325 MG tablet Take 650 mg by mouth every 4 (four) hours as needed for mild pain.    Marland Kitchen aspirin 81 MG chewable tablet Chew 81 mg by mouth daily.    . cetirizine (ZYRTEC) 10 MG tablet Take 10 mg by mouth daily. For 5 days Start date 03/19/14    . clorazepate (TRANXENE) 3.75 MG tablet Take 1 tablet (3.75 mg total) by mouth at bedtime. 30 tablet 0  . famotidine (PEPCID) 20 MG tablet Take 20 mg by mouth 2 (two) times daily.    Marland Kitchen levothyroxine (SYNTHROID, LEVOTHROID) 75 MCG tablet Take  75 mcg by mouth daily.      . magnesium oxide (MAG-OX) 400 MG tablet Take 400 mg by mouth daily.    . metFORMIN (GLUCOPHAGE) 500 MG tablet Take 500 mg by mouth 2 (two) times daily with a meal.    . metoprolol (LOPRESSOR) 50 MG tablet Take 50 mg by mouth 2 (two) times daily.    . sennosides-docusate sodium (SENOKOT-S) 8.6-50 MG tablet Take 2 tablets by mouth at bedtime.    . sertraline (ZOLOFT) 50 MG tablet Take 50 mg by mouth daily.      Marland Kitchen tiotropium (SPIRIVA) 18 MCG inhalation capsule Place 1 capsule (18 mcg total) into inhaler and inhale daily. 30 capsule prn  . Vitamin D, Ergocalciferol, (DRISDOL) 50000 UNITS CAPS capsule Take 50,000 Units by mouth every 7 (seven) days.    . furosemide (LASIX) 20 MG tablet Take 1 tablet (20 mg total) by mouth 2 (two) times daily as needed. For weight gain of 4 pounds or more daily 60 tablet 6  . hydrOXYzine (ATARAX/VISTARIL) 10 MG tablet Take 10 mg by mouth every 8 (eight) hours as needed.     Marland Kitchen ibuprofen (ADVIL,MOTRIN) 200 MG tablet Take 200 mg by mouth every 6 (six) hours as needed.     . Multiple Vitamin (MULTIVITAMIN PO) Take 1 tablet by mouth daily.      Marland Kitchen omeprazole (PRILOSEC) 20 MG capsule Take 20 mg by mouth daily.    . potassium chloride (K-DUR) 10 MEQ tablet Take 1 tablet (10 mEq total) by mouth 2 (two) times daily as needed. To take with lasix 60 tablet 6  . ranitidine (ZANTAC) 75 MG tablet Take 75 mg by mouth daily as needed.     . tapentadol (NUCYNTA) 50 MG TABS tablet Take one tablet by mouth every 6 hours as needed for severe pain 120 tablet 0  . traMADol (ULTRAM) 50 MG tablet Take 50 mg by mouth every 6 (six) hours as needed.       Allergies: Allergies as of 03/20/2014 - Review Complete 03/20/2014  Allergen Reaction Noted  . Aricept [donepezil hydrochloride]  09/24/2009  . Depakote [divalproex sodium]  03/20/2014  . Hydrocodone-acetaminophen    . Metoprolol    . Nitrofurantoin  10/10/2008  . Sulfonamide derivatives  04/06/2006   Past  Medical History  Diagnosis Date  . Hypertension   . Diabetes mellitus   . Hypothyroidism   . Vertigo   . Anemia   . Osteoarthritis   . GERD (gastroesophageal reflux disease)   . Diverticulosis   . Depression   . H/O: hysterectomy     at age 14  . COPD (chronic obstructive pulmonary disease)   . History of DVT (deep vein thrombosis)   . Anxiety   .  Dyspnea   . Enlarged heart   . Fibromyalgia   . Degenerative joint disease   . Degenerative disk disease   . Tremor   . Falls   . Iron deficiency anemia, unspecified 07/15/2012  . CHF (congestive heart failure)   . Cancer     basal cell  . Atrial fibrillation 08/21/2013  . HIATAL HERNIA WITH REFLUX 10/13/2006    Qualifier: Diagnosis of  By: Silvio Pate MD, [facility]   . COPD with chronic bronchitis 10/13/2006    Never a primary smoker, but secondhand smoke from husband by history. Emphysema and chronic bronchitis, mild.    . Senile dementia, uncomplicated 08/20/3084    Qualifier: Diagnosis of  By: Copland MD, Frederico Hamman    . VERTIGO 10/02/2008    Qualifier: Diagnosis of  By: Lorelei Pont MD, Frederico Hamman    . TREMOR, ESSENTIAL 10/13/2006    Qualifier: Diagnosis of  By: Silvio Pate MD, [facility] Cancer   . POSITIVE PPD 06/02/2007    Qualifier: Diagnosis of  By: Julien Girt CMA, Marliss Czar    . COLONIC POLYPS, HX OF 10/13/2006    Qualifier: Diagnosis of  By: Silvio Pate MD, Baird Cancer    Past Surgical History  Procedure Laterality Date  . Cholecystectomy  1944  . Appendectomy  1952  . Ovarian cyst removal  1982    secondary ventral hematoma  . Bladder tack  1997  . Cataract extraction, bilateral    . Kyphosis surgery    . Total abdominal hysterectomy    . Salpingoophorectomy      unilateral salpingo-oophorectomy  . Orif right hip     Family History  Problem Relation Age of Onset  . Stroke Mother   . Cancer Father     Pancreatic Cancer  . Kidney disease Sister   . Heart disease Brother   . Stroke Brother   . Heart disease Brother    History   Social History    . Marital Status: Widowed    Spouse Name: N/A    Number of Children: N/A  . Years of Education: N/A   Occupational History  . Not on file.   Social History Main Topics  . Smoking status: Never Smoker   . Smokeless tobacco: Never Used  . Alcohol Use: No  . Drug Use: No  . Sexual Activity: No   Other Topics Concern  . Not on file   Social History Narrative    Review of Systems: Review of systems not obtained due to patient's mental status.  Physical Exam: Blood pressure 163/82, pulse 151, temperature 97.4 F (36.3 C), temperature source Rectal, resp. rate 21, SpO2 98 %. BP 157/95 mmHg  Pulse 107  Temp(Src) 98.1 F (36.7 C) (Oral)  Resp 18  Wt 42.5 kg (93 lb 11.1 oz)  SpO2 99% General appearance: frail, confused elderly woman in no acute distress Head: Normocephalic, without obvious abnormality, atraumatic. No facial drooping noted. Eyes: Pale conjuctiva and mucous membranes. PERRL, EOM's intact. Fundi benign. Ears: normal TM's and external ear canals both ears Nose: Nares normal. Septum midline. Mucosa normal. No drainage or sinus tenderness. Throat: lips, mucosa, and tongue normal; poor dentition Neck: no adenopathy and supple, symmetrical, trachea midline Back: symmetric, no curvature. No CVA tenderness. Lungs: clear to auscultation bilaterally Heart: irregularly irregular rhythm.  Abdomen: soft, non-tender; bowel sounds normal; no masses,  no organomegaly Extremities: extremities normal, atraumatic, no cyanosis or edema Pulses: 2+ and symmetric Skin: Skin is warm and dry.  Neurologic: Oriented to self, but not place  or time. Unable to assess strength, coordination, sensation due to patient's mental status.  Lab results: Basic Metabolic Panel:  Recent Labs  03/20/14 1251  NA 138  K 3.8  CL 101  CO2 23  GLUCOSE 113*  BUN 18  CREATININE 0.70  CALCIUM 9.2   Liver Function Tests:  Recent Labs  03/20/14 1251  AST 33  ALT 18  ALKPHOS 116  BILITOT  1.3*  PROT 6.4  ALBUMIN 3.5   CBC:  Recent Labs  03/20/14 1251  WBC 7.2  NEUTROABS 3.9  HGB 13.8  HCT 41.8  MCV 83.3  PLT 250   CBG:  Recent Labs  03/20/14 1304 03/20/14 1701  GLUCAP 108* 83   Urinalysis:  Recent Labs  03/20/14 1300  COLORURINE AMBER*  LABSPEC 1.025  PHURINE 7.0  GLUCOSEU NEGATIVE  HGBUR NEGATIVE  BILIRUBINUR NEGATIVE  KETONESUR 15*  PROTEINUR 100*  UROBILINOGEN 1.0  NITRITE NEGATIVE  LEUKOCYTESUR TRACE*   Imaging results:  Dg Chest 2 View  03/20/2014   CLINICAL DATA:  Weakness and confusion  EXAM: CHEST  2 VIEW  COMPARISON:  09/09/2012  FINDINGS: Cardiac shadow remains enlarged. Aortic calcifications are again noted. Diffuse interstitial changes are seen. Some focal infiltrate is noted in the lateral left lung base. No sizable effusion is noted. Changes of prior vertebral augmentation are seen. Changes of prior right humeral fracture are seen.  IMPRESSION: Left basilar infiltrate with superimposed more chronic interstitial change.   Electronically Signed   By: Inez Catalina M.D.   On: 03/20/2014 13:49   Ct Head Wo Contrast  03/20/2014   CLINICAL DATA:  Altered mental status for 3 days. New onset dysarthria and right facial droop  EXAM: CT HEAD WITHOUT CONTRAST  TECHNIQUE: Contiguous axial images were obtained from the base of the skull through the vertex without intravenous contrast.  COMPARISON:  March 11, 2011  FINDINGS: Moderate diffuse atrophy is stable. There is no intracranial mass, hemorrhage, extra-axial fluid collection, or midline shift. There is patchy small vessel disease throughout the centra semiovale bilaterally, stable. No acute infarct apparent. No new gray-white compartment lesion. The bony calvarium appears intact. The mastoid air cells are clear.  IMPRESSION: Atrophy with extensive periventricular small vessel disease. No intracranial mass, hemorrhage, or acute appearing infarct.   Electronically Signed   By: Lowella Grip M.D.    On: 03/20/2014 13:42    Assessment & Plan by Problem: [name] is a 81 yo with a PMHx of who presents with onset of weakness and altered mental status. She is stable with normal vital signs, normal CBC, BMP.   Altered mental status: Initial history from nursing facility of unilateral facial droop and weakness is concerning for TIA/CVA, but CT was negative. Likely chronic dementia with slow decline of mental status function, but could possibly be precitated by infection. Possible pneumonia noted on CXR, but it is present on past films and there is no cough, SOB, clear lungs on exam and WBC is normal, patient is aefebrile, no tachypnea. UA is contaminated, but noted to have UTI in nursing home and was treated with Amoxicillin last week. LDH was 2.5, which is likely due to dehydration. Troponin was negative in the ED. - IV NS 100 mL/hr with KCl 20 mEq/L - levaquin IV 750 mg in ED - f/u urine culture - order sitter for risk of falls  Chronic Atrial Fibrillation: Present on exam, HR was noted to <100 in ED. Last seen by cardiologist Dr. Arta Bruce on 01/19/14,  who has rate control with metoprolol 50 mg BID. EF 60-65% on 10/04, mild LVH, L atrial enlargement. Cardiologist note prescribes lasix 20 mg BID, but it is not given at nursing facility. - metoprolol 50 mg BID  Iron deficiency anemia: Hbg is 13.8 on admission. Sees Dr. Marin Olp.  COPD with chronic bronchitis: According to note from Dr. Annamaria Boots on 8/13, controlled without exacerbations. Spiriva helps with breathing symptoms. - spiriva 18 mcg inhalation  Hypothyroidism: TSH 3.29 on admission. On levothyroxine 75 mcg PO qd - levothyroxine 75 mcg PO qd  Depression: stable. - zoloft 50 mg  Diet:  - NPO until swallow study completed  Code Status: DNR, DNI. -Defer to [place] [phone]] for decisions -Confirmed on admission that patient would want to be kept comfortable should she decline further  Dispo: - stays  in nursing home  [place] [facility]    This is a Careers information officer Note.  The care of the patient was discussed with Dr. Posey Pronto and the assessment and plan was formulated with their assistance.  Please see their note for official documentation of the patient encounter.   Signed: Billy Fischer, Med Student 03/20/2014, 6:22 PM

## 2014-03-20 NOTE — ED Notes (Signed)
Daughter would like to speak to someone regarding mothers care- [phone]- daughter currently in [place].  Worried about mothers dementia, would like to see about sitter?

## 2014-03-20 NOTE — ED Notes (Signed)
Per [facility]: pt. Is from [facility]. Facility called EMS because of decreased LOC and increased lethargy since this weekend. New onset garbled speech and R facial droop since yesterday. EMS noted bilateral upper and lower weakness but uncertain of baseline. Pt. Does have dementia, disoriented x4. Pt. Currently in [facility].

## 2014-03-21 ENCOUNTER — Inpatient Hospital Stay (HOSPITAL_COMMUNITY): Payer: Medicare PPO

## 2014-03-21 DIAGNOSIS — K219 Gastro-esophageal reflux disease without esophagitis: Secondary | ICD-10-CM

## 2014-03-21 DIAGNOSIS — K5900 Constipation, unspecified: Secondary | ICD-10-CM

## 2014-03-21 DIAGNOSIS — F329 Major depressive disorder, single episode, unspecified: Secondary | ICD-10-CM

## 2014-03-21 DIAGNOSIS — E039 Hypothyroidism, unspecified: Secondary | ICD-10-CM

## 2014-03-21 DIAGNOSIS — J449 Chronic obstructive pulmonary disease, unspecified: Secondary | ICD-10-CM

## 2014-03-21 DIAGNOSIS — I482 Chronic atrial fibrillation: Secondary | ICD-10-CM

## 2014-03-21 DIAGNOSIS — R4182 Altered mental status, unspecified: Secondary | ICD-10-CM

## 2014-03-21 DIAGNOSIS — E119 Type 2 diabetes mellitus without complications: Secondary | ICD-10-CM

## 2014-03-21 LAB — GLUCOSE, CAPILLARY
GLUCOSE-CAPILLARY: 105 mg/dL — AB (ref 70–99)
GLUCOSE-CAPILLARY: 137 mg/dL — AB (ref 70–99)
Glucose-Capillary: 89 mg/dL (ref 70–99)

## 2014-03-21 LAB — LACTIC ACID, PLASMA: LACTIC ACID, VENOUS: 1.9 mmol/L (ref 0.5–2.2)

## 2014-03-21 MED ORDER — LEVOFLOXACIN IN D5W 750 MG/150ML IV SOLN
750.0000 mg | Freq: Once | INTRAVENOUS | Status: AC
  Administered 2014-03-21: 750 mg via INTRAVENOUS
  Filled 2014-03-21 (×2): qty 150

## 2014-03-21 MED ORDER — INSULIN ASPART 100 UNIT/ML ~~LOC~~ SOLN
0.0000 [IU] | SUBCUTANEOUS | Status: DC
  Administered 2014-03-21 (×2): 1 [IU] via SUBCUTANEOUS

## 2014-03-21 MED ORDER — ENSURE COMPLETE PO LIQD
237.0000 mL | Freq: Two times a day (BID) | ORAL | Status: DC
  Administered 2014-03-21 – 2014-03-28 (×10): 237 mL via ORAL

## 2014-03-21 MED ORDER — PNEUMOCOCCAL VAC POLYVALENT 25 MCG/0.5ML IJ INJ
0.5000 mL | INJECTION | INTRAMUSCULAR | Status: AC
  Administered 2014-03-22: 0.5 mL via INTRAMUSCULAR
  Filled 2014-03-21: qty 0.5

## 2014-03-21 NOTE — Progress Notes (Signed)
Subjective: Patient is pleasant this morning. She has no complaints. Mental status seems improved from yesterday. Per sitter, she has had no acute events and has been very sweet but conversation is mostly nonsensical.  Objective: Vital signs in last 24 hours: Filed Vitals:   03/20/14 2023 03/21/14 0645 03/21/14 0747 03/21/14 1043  BP: 157/95 158/89  145/99  Pulse: 107 92    Temp: 98.1 F (36.7 C) 99 F (37.2 C)    TempSrc: Oral Oral    Resp: 18 20    Weight:      SpO2: 99% 89% 97%    Weight change:   Intake/Output Summary (Last 24 hours) at 03/21/14 1314 Last data filed at 03/21/14 4270  Gross per 24 hour  Intake 1063.33 ml  Output   1550 ml  Net -486.67 ml   General appearance: frail elderly woman, pleasant, alert, no distress Lungs: clear to auscultation bilaterally and no increased work of breathing Heart: tachycardic, irregularly irregular rhythm Abdomen: soft, non-tender; bowel sounds normal; no masses,  no organomegaly Extremities: extremities normal, atraumatic, no cyanosis or edema Skin: Skin color, texture, turgor normal. No rashes or lesions. Neurologic: Equal strength in upper and lower extremities bilaterally. Oriented to person only. Mental status seems improved from yesterday.   Lab Results: Basic Metabolic Panel:  Recent Labs  03/20/14 1251 03/20/14 2051  NA 138 137  K 3.8 3.4*  CL 101 101  CO2 23 25  GLUCOSE 113* 86  BUN 18 13  CREATININE 0.70 0.63  CALCIUM 9.2 9.0   Liver Function Tests:  Recent Labs  03/20/14 1251  AST 33  ALT 18  ALKPHOS 116  BILITOT 1.3*  PROT 6.4  ALBUMIN 3.5   CBC:  Recent Labs  03/20/14 1251  WBC 7.2  NEUTROABS 3.9  HGB 13.8  HCT 41.8  MCV 83.3  PLT 250   Cardiac Enzymes:  Recent Labs  03/20/14 2051  TROPONINI <0.03   CBG:  Recent Labs  03/20/14 1304 03/20/14 1701 03/21/14 1212  GLUCAP 108* 83 105*   Thyroid Function Tests:  Recent Labs  03/20/14 2051  TSH 3.291    Urinalysis:  Recent Labs  03/20/14 1300  COLORURINE AMBER*  LABSPEC 1.025  PHURINE 7.0  GLUCOSEU NEGATIVE  HGBUR NEGATIVE  BILIRUBINUR NEGATIVE  KETONESUR 15*  PROTEINUR 100*  UROBILINOGEN 1.0  NITRITE NEGATIVE  LEUKOCYTESUR TRACE*   Micro Results: Recent Results (from the past 240 hour(s))  MRSA PCR Screening     Status: None   Collection Time: 03/20/14  8:37 PM  Result Value Ref Range Status   MRSA by PCR NEGATIVE NEGATIVE Final    Comment:        The GeneXpert MRSA Assay (FDA approved for NASAL specimens only), is one component of a comprehensive MRSA colonization surveillance program. It is not intended to diagnose MRSA infection nor to guide or monitor treatment for MRSA infections.    Studies/Results: X-ray Chest Pa And Lateral  03/21/2014   CLINICAL DATA:  Weakness, dementia  EXAM: CHEST  2 VIEW  COMPARISON:  03/20/2014  FINDINGS: Cardiac shadow is again enlarged. Aortic calcifications are again seen. Diffuse interstitial changes are again noted and stable. Persistent but improving left basilar density slight increase in right upper lobe density is noted which is likely related to atelectasis is seen.  IMPRESSION: Improved aeration in the left base with some slight increased density in the right apex. Continued followup is recommended.   Electronically Signed   By: Inez Catalina  M.D.   On: 03/21/2014 08:11   Dg Chest 2 View  03/20/2014   CLINICAL DATA:  Weakness and confusion  EXAM: CHEST  2 VIEW  COMPARISON:  09/09/2012  FINDINGS: Cardiac shadow remains enlarged. Aortic calcifications are again noted. Diffuse interstitial changes are seen. Some focal infiltrate is noted in the lateral left lung base. No sizable effusion is noted. Changes of prior vertebral augmentation are seen. Changes of prior right humeral fracture are seen.  IMPRESSION: Left basilar infiltrate with superimposed more chronic interstitial change.   Electronically Signed   By: Inez Catalina M.D.    On: 03/20/2014 13:49   Ct Head Wo Contrast  03/20/2014   CLINICAL DATA:  Altered mental status for 3 days. New onset dysarthria and right facial droop  EXAM: CT HEAD WITHOUT CONTRAST  TECHNIQUE: Contiguous axial images were obtained from the base of the skull through the vertex without intravenous contrast.  COMPARISON:  March 11, 2011  FINDINGS: Moderate diffuse atrophy is stable. There is no intracranial mass, hemorrhage, extra-axial fluid collection, or midline shift. There is patchy small vessel disease throughout the centra semiovale bilaterally, stable. No acute infarct apparent. No new gray-white compartment lesion. The bony calvarium appears intact. The mastoid air cells are clear.  IMPRESSION: Atrophy with extensive periventricular small vessel disease. No intracranial mass, hemorrhage, or acute appearing infarct.   Electronically Signed   By: Lowella Grip M.D.   On: 03/20/2014 13:42   Medications: I have reviewed the patient's current medications. Scheduled Meds: . sodium chloride   Intravenous STAT  . enoxaparin (LOVENOX) injection  30 mg Subcutaneous Q24H  . famotidine  20 mg Oral BID  . insulin aspart  0-9 Units Subcutaneous 6 times per day  . levofloxacin (LEVAQUIN) IV  750 mg Intravenous Once  . levothyroxine  75 mcg Oral Daily  . metoprolol  50 mg Oral BID  . senna-docusate  2 tablet Oral QHS  . sertraline  50 mg Oral Daily  . tiotropium  18 mcg Inhalation Daily   Continuous Infusions:  PRN Meds:. Assessment/Plan: Active Problems:   HCAP (healthcare-associated pneumonia)   Weakness   Altered mental status:  Likely chronic dementia with slow decline of mental status function, but could possibly be precitated by infection. Possible pneumonia noted on CXR. Specific signs of infection can be absent in the elderly, as a febrile response to infection may be blunted. Thus, increased confusion, anorexia may signal an infection. She received Levaquin in the ED yesterday and  seems to be improved today.  - IV NS 100 mL/hr with KCl 20 mEq/L - Day 2 of levaquin IV 750 mg today - f/u urine culture - sitter for risk of falls  Chronic Atrial Fibrillation: HR ranged from 63-103, currently 103. Last seen by cardiologist Dr. Arta Bruce on 01/19/14, who has rate control with metoprolol 50 mg BID. EF 60-65% on 10/04, mild LVH, L atrial enlargement. Cardiologist note prescribes lasix 20 mg BID, but it is not given at nursing facility. - metoprolol 50 mg BID - d/c telemetry due to chronic stable a-fib  Iron deficiency anemia: Hbg is 13.8 on admission. Sees Dr. Marin Olp.  COPD with chronic bronchitis: According to note from Dr. Annamaria Boots on 8/13, controlled without exacerbations. Spiriva helps with breathing symptoms. - spiriva 18 mcg inhalation  Hypothyroidism: TSH 3.29 on admission. On levothyroxine 75 mcg PO qd - levothyroxine 75 mcg PO qd  Constipation: continue Senokot-S.  Depression: stable. - zoloft 50 mg  Diet:  - heart healthy  Code Status: DNR, DNI. -Defer to [place] [[phone]] for decisions -Confirmed on admission that patient would want to be kept comfortable should she decline further  Dispo: - stays in nursing home [place] [facility]   This is a Careers information officer Note.  The care of the patient was discussed with Dr. Posey Pronto and the assessment and plan formulated with their assistance.  Please see their attached note for official documentation of the daily encounter.   LOS: 1 day   Billy Fischer, Med Student 03/21/2014, 1:14 PM

## 2014-03-21 NOTE — Progress Notes (Signed)
Subjective: This AM, she is anxious to know what is going on, and I reassured her that we are still trying to piece things together though the good news is that it doesn't appear to be a heart attack or stroke. She smiled and appreciated my presence. Per sitter, she has been pleasant and attempts to have a conversation though cannot maintain one before becoming distracted.  Objective: Vital signs in last 24 hours: Filed Vitals:   03/20/14 2023 03/21/14 0645 03/21/14 0747 03/21/14 1043  BP: 157/95 158/89  145/99  Pulse: 107 92    Temp: 98.1 F (36.7 C) 99 F (37.2 C)    TempSrc: Oral Oral    Resp: 18 20    Weight:      SpO2: 99% 89% 97%    Weight change:   Intake/Output Summary (Last 24 hours) at 03/21/14 1426 Last data filed at 03/21/14 1425  Gross per 24 hour  Intake 1123.33 ml  Output   1550 ml  Net -426.67 ml   General: resting in bed, NAD HEENT: PERRL, EOMI, no scleral icterus Cardiac: RRR, no rubs, murmurs or gallops Pulm: clear to auscultation bilaterally, no wheezes, rales, or rhonchi in the anterior lung fields Abd: soft, nontender, nondistended, BS present Ext: warm and well perfused, DP pulses present bilaterally L>R, no pedal edema Neuro: oriented to name though not to place or year   Lab Results: Basic Metabolic Panel:  Recent Labs Lab 03/20/14 1251 03/20/14 2051  NA 138 137  K 3.8 3.4*  CL 101 101  CO2 23 25  GLUCOSE 113* 86  BUN 18 13  CREATININE 0.70 0.63  CALCIUM 9.2 9.0   Liver Function Tests:  Recent Labs Lab 03/20/14 1251  AST 33  ALT 18  ALKPHOS 116  BILITOT 1.3*  PROT 6.4  ALBUMIN 3.5   CBC:  Recent Labs Lab 03/20/14 1251  WBC 7.2  NEUTROABS 3.9  HGB 13.8  HCT 41.8  MCV 83.3  PLT 250   Cardiac Enzymes:  Recent Labs Lab 03/20/14 2051  TROPONINI <0.03   CBG:  Recent Labs Lab 03/20/14 1304 03/20/14 1701 03/21/14 1212  GLUCAP 108* 83 105*   Thyroid Function Tests:  Recent Labs Lab 03/20/14 2051  TSH  3.291   Urinalysis:  Recent Labs Lab 03/20/14 1300  COLORURINE AMBER*  LABSPEC 1.025  PHURINE 7.0  GLUCOSEU NEGATIVE  HGBUR NEGATIVE  BILIRUBINUR NEGATIVE  KETONESUR 15*  PROTEINUR 100*  UROBILINOGEN 1.0  NITRITE NEGATIVE  LEUKOCYTESUR TRACE*     Micro Results: Recent Results (from the past 240 hour(s))  MRSA PCR Screening     Status: None   Collection Time: 03/20/14  8:37 PM  Result Value Ref Range Status   MRSA by PCR NEGATIVE NEGATIVE Final    Comment:        The GeneXpert MRSA Assay (FDA approved for NASAL specimens only), is one component of a comprehensive MRSA colonization surveillance program. It is not intended to diagnose MRSA infection nor to guide or monitor treatment for MRSA infections.    Studies/Results: X-ray Chest Pa And Lateral  03/21/2014   CLINICAL DATA:  Weakness, dementia  EXAM: CHEST  2 VIEW  COMPARISON:  03/20/2014  FINDINGS: Cardiac shadow is again enlarged. Aortic calcifications are again seen. Diffuse interstitial changes are again noted and stable. Persistent but improving left basilar density slight increase in right upper lobe density is noted which is likely related to atelectasis is seen.  IMPRESSION: Improved aeration in the left  base with some slight increased density in the right apex. Continued followup is recommended.   Electronically Signed   By: Inez Catalina M.D.   On: 03/21/2014 08:11   Dg Chest 2 View  03/20/2014   CLINICAL DATA:  Weakness and confusion  EXAM: CHEST  2 VIEW  COMPARISON:  09/09/2012  FINDINGS: Cardiac shadow remains enlarged. Aortic calcifications are again noted. Diffuse interstitial changes are seen. Some focal infiltrate is noted in the lateral left lung base. No sizable effusion is noted. Changes of prior vertebral augmentation are seen. Changes of prior right humeral fracture are seen.  IMPRESSION: Left basilar infiltrate with superimposed more chronic interstitial change.   Electronically Signed   By: Inez Catalina M.D.   On: 03/20/2014 13:49   Ct Head Wo Contrast  03/20/2014   CLINICAL DATA:  Altered mental status for 3 days. New onset dysarthria and right facial droop  EXAM: CT HEAD WITHOUT CONTRAST  TECHNIQUE: Contiguous axial images were obtained from the base of the skull through the vertex without intravenous contrast.  COMPARISON:  March 11, 2011  FINDINGS: Moderate diffuse atrophy is stable. There is no intracranial mass, hemorrhage, extra-axial fluid collection, or midline shift. There is patchy small vessel disease throughout the centra semiovale bilaterally, stable. No acute infarct apparent. No new gray-white compartment lesion. The bony calvarium appears intact. The mastoid air cells are clear.  IMPRESSION: Atrophy with extensive periventricular small vessel disease. No intracranial mass, hemorrhage, or acute appearing infarct.   Electronically Signed   By: Lowella Grip M.D.   On: 03/20/2014 13:42   Medications: I have reviewed the patient's current medications. Scheduled Meds: . sodium chloride   Intravenous STAT  . enoxaparin (LOVENOX) injection  30 mg Subcutaneous Q24H  . famotidine  20 mg Oral BID  . feeding supplement (ENSURE COMPLETE)  237 mL Oral BID BM  . insulin aspart  0-9 Units Subcutaneous 6 times per day  . levofloxacin (LEVAQUIN) IV  750 mg Intravenous Once  . levothyroxine  75 mcg Oral Daily  . metoprolol  50 mg Oral BID  . [START ON 03/22/2014] pneumococcal 23 valent vaccine  0.5 mL Intramuscular Tomorrow-1000  . senna-docusate  2 tablet Oral QHS  . sertraline  50 mg Oral Daily  . tiotropium  18 mcg Inhalation Daily   Continuous Infusions:  PRN Meds:. Assessment/Plan: Altered mental status: CXR findings suggestive of PNA as are UA findings for a UTI. Received Levaquin yesterday and appears improved today. -Abx Day 2: Continue Levaquin 750mg  IV -Follow-up urine culture -Continue sitter for fall risk -Update HCPOA  Chronic a-fib: Last seen by Dr. Rockey Situ  [Cardiology] on 01/19/14 at which time anticoagulation was deferred given her fall risk. Echo with EF 55-60% with mild LVH, L atrial enlargement, pulmonary HTN in September 2011. Per last cardiologist's note, she is on Lasix 20mg  BID though every other day. EKG with stable findings this AM.  -Discontinue telemetry -Continue metoprolol 50mg  BID  DM2: A1c 7.7, April 2011. CBGs trending mostly low 100s. -Check CBGs TID WC & SSI-S   Hypothyroidism: Continue Synthroid 70mcg.  COPD: Continue Spiriva   GERD: Continue Pepcid   Constipation: Continue Senokot-S.  Depression: Continue Zoloft.  #FEN:  -Diet: Heart Healthy  #DVT prophylaxis: Lovenox  #CODE STATUS: DNR/DNI -Defer to [facility] [phone] for decisions -Confirmed on admission that patient would want to be kept comfortable should she decline further  Dispo: Disposition is deferred at this time, awaiting improvement of current medical problems.  The patient does have a current PCP Maryland Pink, MD) and does not need an [facility] hospital follow-up appointment after discharge.  The patient does not have transportation limitations that hinder transportation to clinic appointments.  .Services Needed at time of discharge: Y = Yes, Blank = No PT:   OT:   RN:   Equipment:   Other:     LOS: 1 day   Charlott Rakes, MD 03/21/2014, 2:26 PM

## 2014-03-21 NOTE — Discharge Summary (Signed)
Name: [name] MRN: [id number] DOB: 1924-08-31 81 y.o. PCP: Maryland Pink, MD  Date of Admission: 03/20/2014 12:23 PM Date of Discharge: 03/28/2014 Attending Physician: Bertha Stakes, MD  Discharge Diagnosis: Altered mental status Senile dementia Acute urinary retention R arm ecchymosis Chronic a-fib DM2 Hypothyroidism COPD GERD Constipation   Discharge Medications:   Medication List    STOP taking these medications        cetirizine 10 MG tablet  Commonly known as:  ZYRTEC     ibuprofen 200 MG tablet  Commonly known as:  ADVIL,MOTRIN     omeprazole 20 MG capsule  Commonly known as:  PRILOSEC     ranitidine 75 MG tablet  Commonly known as:  ZANTAC     traMADol 50 MG tablet  Commonly known as:  ULTRAM      TAKE these medications        acetaminophen 325 MG tablet  Commonly known as:  TYLENOL  Take 650 mg by mouth every 4 (four) hours as needed for mild pain.     aspirin 81 MG chewable tablet  Chew 81 mg by mouth daily.     clorazepate 3.75 MG tablet  Commonly known as:  TRANXENE  Take 1 tablet (3.75 mg total) by mouth at bedtime.     famotidine 20 MG tablet  Commonly known as:  PEPCID  Take 20 mg by mouth 2 (two) times daily.     feeding supplement (ENSURE COMPLETE) Liqd  Take 237 mLs by mouth 2 (two) times daily between meals.     furosemide 20 MG tablet  Commonly known as:  LASIX  Take 1 tablet (20 mg total) by mouth 2 (two) times daily as needed. For weight gain of 4 pounds or more daily     hydrOXYzine 10 MG tablet  Commonly known as:  ATARAX/VISTARIL  Take 10 mg by mouth every 8 (eight) hours as needed.     levothyroxine 75 MCG tablet  Commonly known as:  SYNTHROID, LEVOTHROID  Take 75 mcg by mouth daily.     magnesium oxide 400 MG tablet  Commonly known as:  MAG-OX  Take 400 mg by mouth daily.     metFORMIN 500 MG tablet  Commonly known as:  GLUCOPHAGE  Take 500 mg by mouth 2 (two) times daily with a meal.     metoprolol  50 MG tablet  Commonly known as:  LOPRESSOR  Take 50 mg by mouth 2 (two) times daily.     MULTIVITAMIN PO  Take 1 tablet by mouth daily.     potassium chloride 10 MEQ tablet  Commonly known as:  K-DUR  Take 1 tablet (10 mEq total) by mouth 2 (two) times daily as needed. To take with lasix     risperiDONE 0.25 MG tablet  Commonly known as:  RISPERDAL  Take 1 tablet in early evening [6-7pm].     sennosides-docusate sodium 8.6-50 MG tablet  Commonly known as:  SENOKOT-S  Take 2 tablets by mouth at bedtime.     sertraline 50 MG tablet  Commonly known as:  ZOLOFT  Take 50 mg by mouth daily.     tapentadol 50 MG Tabs tablet  Commonly known as:  NUCYNTA  Take one tablet by mouth every 6 hours as needed for severe pain     tiotropium 18 MCG inhalation capsule  Commonly known as:  SPIRIVA  Place 1 capsule (18 mcg total) into inhaler and inhale daily.     Vitamin  D (Ergocalciferol) 50000 UNITS Caps capsule  Commonly known as:  DRISDOL  Take 50,000 Units by mouth every 7 (seven) days.        Disposition and follow-up:   [name] was discharged from [facility] in Stable condition.  At the hospital follow up visit please address:  -Psychiatric medication management: if her mental status changes again -Urinary retention: urology follow-up & Foley if she cannot void   Follow-up Appointments:   Discharge Instructions: Discharge Instructions    Call MD for:  difficulty breathing, headache or visual disturbances    Complete by:  As directed      Call MD for:  extreme fatigue    Complete by:  As directed      Call MD for:  persistant dizziness or light-headedness    Complete by:  As directed      Diet Carb Modified    Complete by:  As directed      Discharge instructions    Complete by:  As directed   Please follow her urine output over the next several days to ensure that she is able to void and is not contributing to any changes in her mental  state. If she continues to have trouble voiding, she may require long-term Foley with outpatient urology follow-up.  As she is now on a new psychiatric medication, Risperdal, please have Psychiatry continue to follow her and make additional recommendations to her regimen as needed in managing her mental status.     Increase activity slowly    Complete by:  As directed            Consultations: Treatment Team:  Durward Parcel, MD  Procedures Performed:  X-ray Chest Pa And Lateral  03/21/2014   CLINICAL DATA:  Weakness, dementia  EXAM: CHEST  2 VIEW  COMPARISON:  03/20/2014  FINDINGS: Cardiac shadow is again enlarged. Aortic calcifications are again seen. Diffuse interstitial changes are again noted and stable. Persistent but improving left basilar density slight increase in right upper lobe density is noted which is likely related to atelectasis is seen.  IMPRESSION: Improved aeration in the left base with some slight increased density in the right apex. Continued followup is recommended.   Electronically Signed   By: Inez Catalina M.D.   On: 03/21/2014 08:11   Dg Chest 2 View  03/20/2014   CLINICAL DATA:  Weakness and confusion  EXAM: CHEST  2 VIEW  COMPARISON:  09/09/2012  FINDINGS: Cardiac shadow remains enlarged. Aortic calcifications are again noted. Diffuse interstitial changes are seen. Some focal infiltrate is noted in the lateral left lung base. No sizable effusion is noted. Changes of prior vertebral augmentation are seen. Changes of prior right humeral fracture are seen.  IMPRESSION: Left basilar infiltrate with superimposed more chronic interstitial change.   Electronically Signed   By: Inez Catalina M.D.   On: 03/20/2014 13:49   Dg Wrist 2 Views Right  03/25/2014   CLINICAL DATA:  Swelling of wrist.  EXAM: RIGHT WRIST - 2 VIEW  COMPARISON:  None.  FINDINGS: The bones appear osteopenic. No acute fracture or subluxation identified. Diffuse soft tissue swelling noted. Soft  tissue calcification identified adjacent to the ulnar styloid.  IMPRESSION: 1. No acute bone abnormality. 2. Soft tissue swelling.   Electronically Signed   By: Kerby Moors M.D.   On: 03/25/2014 08:06   Ct Head Wo Contrast  03/26/2014   CLINICAL DATA:  Altered mental status.  Confusion.  EXAM: CT HEAD WITHOUT CONTRAST  TECHNIQUE: Contiguous axial images were obtained from the base of the skull through the vertex without intravenous contrast.  COMPARISON:  03/20/2014  FINDINGS: There is no intracranial hemorrhage, mass or evidence of acute infarction. There is moderate generalized atrophy. There is chronic microvascular ischemic disease. No acute findings are evident. No interval change is evident.  IMPRESSION: Chronic ischemic appearing changes.  No acute findings.   Electronically Signed   By: Andreas Newport M.D.   On: 03/26/2014 13:56   Ct Head Wo Contrast  03/20/2014   CLINICAL DATA:  Altered mental status for 3 days. New onset dysarthria and right facial droop  EXAM: CT HEAD WITHOUT CONTRAST  TECHNIQUE: Contiguous axial images were obtained from the base of the skull through the vertex without intravenous contrast.  COMPARISON:  March 11, 2011  FINDINGS: Moderate diffuse atrophy is stable. There is no intracranial mass, hemorrhage, extra-axial fluid collection, or midline shift. There is patchy small vessel disease throughout the centra semiovale bilaterally, stable. No acute infarct apparent. No new gray-white compartment lesion. The bony calvarium appears intact. The mastoid air cells are clear.  IMPRESSION: Atrophy with extensive periventricular small vessel disease. No intracranial mass, hemorrhage, or acute appearing infarct.   Electronically Signed   By: Lowella Grip M.D.   On: 03/20/2014 13:42   US Abdomen Complete  03/22/2014   CLINICAL DATA:  Abdominal distention.  EXAM: ULTRASOUND ABDOMEN COMPLETE  COMPARISON:  Ultrasound of December 11, 2013.  FINDINGS: Gallbladder: Status post  cholecystectomy.  Common bile duct: Diameter: Not visualized due to overlying bowel gas and lack of patient cooperation.  Liver: Limited visualization is noted, but no focal abnormality is seen.  IVC: Not visualized due to overlying bowel gas and lack of patient cooperation.  Pancreas: Not visualized due to overlying bowel gas and lack of patient cooperation.  Spleen: Not visualized due to overlying bowel gas and lack of patient cooperation.  Right Kidney: Not visualized due to lack of patient cooperation.  Left Kidney: Measures 7.9 cm in length. Not well visualized due to lack of patient cooperation.  Abdominal aorta: Not visualized due to lack of patient cooperation.  Other findings: Moderate urinary bladder distention is noted.  IMPRESSION: Moderate urinary bladder distention. No definite abnormality seen in the visualized portion of the liver. Mild left renal atrophy is noted. Status post cholecystectomy. Remaining structures are not visualized due to combination of overlying bowel gas and lack of patient cooperation.   Electronically Signed   By: Sabino Dick M.D.   On: 03/22/2014 16:54   Dg Chest Port 1 View  03/26/2014   CLINICAL DATA:  Pneumonia.  EXAM: PORTABLE CHEST - 1 VIEW  COMPARISON:  03/21/2014.  FINDINGS: Mediastinum and hilar structures normal. Cardiomegaly with normal pulmonary vascularity. Mild pulmonary interstitial prominence noted suggesting mild CHF and/or pneumonitis. Small right pleural effusion cannot be excluded. No pneumothorax.  IMPRESSION: 1. Cardiomegaly. 2. Mild pulmonary interstitial prominence consistent with mild interstitial edema and/or pneumonitis. Small right pleural effusion cannot be excluded .   Electronically Signed   By: [place]   On: 03/26/2014 11:21   Dg Abd Portable 2v  03/22/2014   CLINICAL DATA:  Abdominal distention.  EXAM: PORTABLE ABDOMEN - 2 VIEW  COMPARISON:  None.  FINDINGS: No evidence of dilated bowel loops or air-fluid levels. No evidence of  free intraperitoneal air. Pelvic phleboliths noted bilaterally. Internal fixation hardware noted in both hips. Severe thoracolumbar degenerative changes and levoscoliosis noted as  well as previous T12 vertebroplasty.  IMPRESSION: Unremarkable bowel gas pattern.  No acute findings.   Electronically Signed   By: Earle Gell M.D.   On: 03/22/2014 11:15   Dg Finger Ring Left  03/25/2014   CLINICAL DATA:  Increased swelling of ring finger.  EXAM: LEFT RING FINGER 2+V  COMPARISON:  None.  FINDINGS: The staff was unable to remove the patient's rings limiting exam. There is diffuse soft tissue swelling. The bones appear osteopenic. Moderate degenerative changes involve the PIP and DIP joints of the fourth and fifth digits. No fractures or subluxations identified.  IMPRESSION: 1. Soft tissue swelling. 2. Osteoarthritis and osteopenia.   Electronically Signed   By: Kerby Moors M.D.   On: 03/25/2014 08:03    Admission HPI: [name] is an 81 year old with multiple medical problems, including dementia, CHF [EF 20-25%, September 2011], chronic a-fib, who presents to the ED today from SNF who presents for altered mental status. History is collected primarily from chart review and caregivers [her friend Jetty Peeks at bedside, Anderson Malta by phone, nursing home by phone].   At her SNF, she was found to have a UTI and treated with amoxicillin x 5 days. During this time, she was also started on Depakote for a mood disorder. Given her worsening confusion and current symptoms, she was referred to the ED for further evaluation. Per her HCPOA, she sustained a L hip fracture in early December after which she was transitioned to SNF and has been declining since. At baseline, she interacts with individuals for a few minutes before forgetting who they are or why they are there. Prior to December, she was living by herself with individuals staying with her around the clock to help minimize her fall risk. She had a prior  hip fracture on the right side for which she underwent ORIF in August and then rehab.   In the ED, she received Levaquin x 1 for presumed HCAP per CXR findings concerning for L infiltrate.   Hospital Course by problem list: Altered mental status: Initially though to be due to infection given LLL infiltrate on admission CXR. She was treated for acute bacterial pneumonia with a 7-day course of Levaquin. Psychiatry was consulted and recommended discontinuing Zoloft and starting Wellbutrin 100mg  and Risperdal 0.25mg  BID. Over the next several days, she became more sedated and less interactive and was thought to be related to Risperdal and Levaquin. After her Risperdal was discontinued and she completed Levaquin therapy, she returned to her tearful baseline. Risperdal was then given at 0.25mg  QHS earlier in the evening [6pm], and she was in a pleasant mood the following morning. Her Wellbutrin was also switched back to her home Zoloft, and she should be followed closely by Psychiatry for further titrations of her medication. Tramadol was also discontinued at discharge and can be refilled by the facility MD if it's necessary given that it can affect her mental status, and she did not need it here in the hospital.  Acute urinary retention: On 03/23/14, she was found to have 800cc of urine by bladder scan and in and out catheterized. The etiology of this could not be established conclusively though may have been in the setting of medications. Anatomical causes could not be ruled out as pelvic examination was limited in the inpatient setting. Urology recommended continuing bladder scans q6-8h with I&O cath for >400cc of urine and possibly a Foley with outpatient urodynamic studies if her condition persisted beyond discharge at the  SNF. She did develop some incontinence several days later which was reassuring that she had the ability to void though may have been masked by her sedation. At the time of discharge, her  most recent bladder scan had 150cc of urine in it. Her urine output should be followed closely over the next several days to ensure that she is not retaining. If retention is persistent, Urology follow-up and continuous Foley may be required.  R arm ecchymosis: On 03/25/14, she developed ecchymosis overlying her R arm and was though to be due to an occluded IV which was subsequently removed. She remained afebrile, and the arm was not warm to touch - making infection less likely. She had been receiving Lovenox, so thrombosis was not suspected either. Her tenderness improved prior to discharge.   Chronic a-fib: She was continued on metoprolol 50mg  BID. Her telemetry was otherwise unremarkable and discontinued by 48 hours into her hospital stay.  DM2: She was continued on SSI-S with CBGs trending mostly 100s.   Hypothyroidism: Remained stable on home medications.  COPD: Remained stable on home medications.  GERD: Remained stable on home medications.  Constipation: Remained stable on home medications.  Depression: As noted above.  Discharge Vitals:   BP 135/76 mmHg  Pulse 76  Temp(Src) 96.8 F (36 C) (Axillary)  Resp 16  Ht 5' (1.524 m)  Wt 93 lb 11.1 oz (42.5 kg)  SpO2 92%  Discharge Labs:  Results for orders placed or performed during the hospital encounter of 03/20/14 (from the past 24 hour(s))  Glucose, capillary     Status: Abnormal   Collection Time: 03/27/14  5:11 PM  Result Value Ref Range   Glucose-Capillary 156 (H) 70 - 99 mg/dL  Basic metabolic panel     Status: Abnormal   Collection Time: 03/27/14  7:16 PM  Result Value Ref Range   Sodium 139 135 - 145 mmol/L   Potassium 3.2 (L) 3.5 - 5.1 mmol/L   Chloride 104 96 - 112 mmol/L   CO2 22 19 - 32 mmol/L   Glucose, Bld 135 (H) 70 - 99 mg/dL   BUN 20 6 - 23 mg/dL   Creatinine, Ser 0.72 0.50 - 1.10 mg/dL   Calcium 8.9 8.4 - 10.5 mg/dL   GFR calc non Af Amer 75 (L) >90 mL/min   GFR calc Af Amer 87 (L) >90 mL/min   Anion  gap 13 5 - 15  Glucose, capillary     Status: Abnormal   Collection Time: 03/27/14 10:42 PM  Result Value Ref Range   Glucose-Capillary 116 (H) 70 - 99 mg/dL  Glucose, capillary     Status: Abnormal   Collection Time: 03/28/14  8:13 AM  Result Value Ref Range   Glucose-Capillary 103 (H) 70 - 99 mg/dL  Glucose, capillary     Status: Abnormal   Collection Time: 03/28/14 12:20 PM  Result Value Ref Range   Glucose-Capillary 137 (H) 70 - 99 mg/dL    Signed: Charlott Rakes, MD 03/28/2014, 3:06 PM    Services Ordered on Discharge: None Equipment Ordered on Discharge: None

## 2014-03-21 NOTE — Progress Notes (Signed)
03/21/2014 1150 Chart reviewed. UR complete. Jonnie Finner RN CCM Case Mgmt phone [phone]

## 2014-03-21 NOTE — Clinical Social Work Note (Signed)
Clinical Social Work Department BRIEF PSYCHOSOCIAL ASSESSMENT 03/21/2014  Patient:  [name], [name]     Account Number:  [id number]     [place] date:  03/20/2014  Clinical Social Worker:  Myles Lipps  Date/Time:  03/21/2014 03:00 PM  Referred by:  Physician  Date Referred:  03/21/2014 Referred for  SNF Placement   Other Referral:   Interview type:  Family Other interview type:   Patient with a legal guardian    PSYCHOSOCIAL DATA Living Status:  FACILITY Admitted from facility:  [facility] Level of care:  [place] Primary support name:  Zipporah Plants [phone] Primary support relationship to patient:  FRIEND Degree of support available:   Patient with a a legal guardian who has provided documentation and is on the shadow chart    CURRENT CONCERNS Current Concerns  Post-Acute Placement   Other Concerns:    SOCIAL WORK ASSESSMENT / PLAN Clinical Social Worker spoke with patient legal guardian over the phone to offer support and discuss patient needs at discharge.  Patient legal guardian states that patient is a current resident at [facility] and plans to return at discharge.  Patient begins private pay arrangements at the facility effective March 22, 2014 so no further insurance authorization needs to be obtained prior to discharge.  Patient legal guardian hopeful for return to [facility] tomorrow.  CSW spoke with facility who is agreeable to accept patient back when medically stable. CSW remains available for support and to facilitate patient discharge needs once medically stable.   Assessment/plan status:  Psychosocial Support/Ongoing Assessment of Needs Other assessment/ plan:   Information/referral to community resources:   Clinical Social Worker confirmed payor source for placement and POA paperwork.    PATIENT'S/FAMILY'S RESPONSE TO PLAN OF CARE: Patient alert but not oriented at this time.  Patient daughter lives in  [place] and therefore patient has a local legal guardian.  Patient guardian agreeable with patient return to [facility] once medically stable.  Patient guardian verbalized understanding of CSW role and appreciation for support.

## 2014-03-22 ENCOUNTER — Inpatient Hospital Stay (HOSPITAL_COMMUNITY): Payer: Medicare PPO

## 2014-03-22 DIAGNOSIS — R140 Abdominal distension (gaseous): Secondary | ICD-10-CM

## 2014-03-22 DIAGNOSIS — F0390 Unspecified dementia without behavioral disturbance: Secondary | ICD-10-CM

## 2014-03-22 DIAGNOSIS — J159 Unspecified bacterial pneumonia: Secondary | ICD-10-CM | POA: Diagnosis not present

## 2014-03-22 LAB — GLUCOSE, CAPILLARY
GLUCOSE-CAPILLARY: 82 mg/dL (ref 70–99)
Glucose-Capillary: 154 mg/dL — ABNORMAL HIGH (ref 70–99)
Glucose-Capillary: 102 mg/dL — ABNORMAL HIGH (ref 70–99)
Glucose-Capillary: 111 mg/dL — ABNORMAL HIGH (ref 70–99)

## 2014-03-22 LAB — URINE CULTURE

## 2014-03-22 LAB — BASIC METABOLIC PANEL
ANION GAP: 13 (ref 5–15)
BUN: 9 mg/dL (ref 6–23)
CO2: 20 mmol/L (ref 19–32)
CREATININE: 0.65 mg/dL (ref 0.50–1.10)
Calcium: 9 mg/dL (ref 8.4–10.5)
Chloride: 103 mEq/L (ref 96–112)
GFR calc Af Amer: 90 mL/min — ABNORMAL LOW (ref >90)
GFR calc non Af Amer: 78 mL/min — ABNORMAL LOW (ref >90)
Glucose, Bld: 100 mg/dL — ABNORMAL HIGH (ref 70–99)
POTASSIUM: 3.7 mmol/L (ref 3.5–5.1)
SODIUM: 136 mmol/L (ref 135–145)

## 2014-03-22 MED ORDER — INSULIN ASPART 100 UNIT/ML ~~LOC~~ SOLN
0.0000 [IU] | Freq: Three times a day (TID) | SUBCUTANEOUS | Status: DC
  Administered 2014-03-22: 2 [IU] via SUBCUTANEOUS
  Administered 2014-03-23 – 2014-03-24 (×4): 1 [IU] via SUBCUTANEOUS
  Administered 2014-03-25: 2 [IU] via SUBCUTANEOUS
  Administered 2014-03-25 – 2014-03-27 (×3): 1 [IU] via SUBCUTANEOUS
  Administered 2014-03-27: 2 [IU] via SUBCUTANEOUS
  Administered 2014-03-28 (×2): 1 [IU] via SUBCUTANEOUS

## 2014-03-22 MED ORDER — POTASSIUM CHLORIDE 20 MEQ PO PACK
40.0000 meq | PACK | Freq: Two times a day (BID) | ORAL | Status: DC
  Filled 2014-03-22 (×3): qty 2

## 2014-03-22 MED ORDER — LEVOFLOXACIN IN D5W 750 MG/150ML IV SOLN
750.0000 mg | INTRAVENOUS | Status: DC
  Administered 2014-03-23: 750 mg via INTRAVENOUS
  Filled 2014-03-22 (×2): qty 150

## 2014-03-22 MED ORDER — LEVOFLOXACIN IN D5W 750 MG/150ML IV SOLN
750.0000 mg | INTRAVENOUS | Status: DC
  Filled 2014-03-22: qty 150

## 2014-03-22 MED ORDER — LEVOFLOXACIN IN D5W 750 MG/150ML IV SOLN: 750.0000 mg | INTRAVENOUS | Status: DC

## 2014-03-22 MED ORDER — POTASSIUM CHLORIDE CRYS ER 20 MEQ PO TBCR
40.0000 meq | EXTENDED_RELEASE_TABLET | Freq: Two times a day (BID) | ORAL | Status: DC
  Administered 2014-03-22 (×2): 40 meq via ORAL
  Filled 2014-03-22 (×4): qty 2

## 2014-03-22 MED ORDER — DEXTROSE-NACL 5-0.9 % IV SOLN
INTRAVENOUS | Status: DC
  Administered 2014-03-22 – 2014-03-24 (×3): via INTRAVENOUS

## 2014-03-22 MED ORDER — RISPERIDONE 0.25 MG PO TABS
0.2500 mg | ORAL_TABLET | Freq: Two times a day (BID) | ORAL | Status: DC
  Administered 2014-03-22 – 2014-03-23 (×3): 0.25 mg via ORAL
  Filled 2014-03-22 (×6): qty 1

## 2014-03-22 MED ORDER — BUPROPION HCL ER (SR) 100 MG PO TB12
100.0000 mg | ORAL_TABLET | Freq: Every day | ORAL | Status: DC
  Administered 2014-03-22 – 2014-03-27 (×5): 100 mg via ORAL
  Filled 2014-03-22 (×7): qty 1

## 2014-03-22 NOTE — Progress Notes (Signed)
Subjective: Fluctuating mental status. Endorsed abdominal pain this morning.  Objective: Vital signs in last 24 hours: Filed Vitals:   03/22/14 0513 03/22/14 0523 03/22/14 1441 03/22/14 1448  BP: 156/102 153/109 149/119 140/98  Pulse: 97  101   Temp: 98.1 F (36.7 C)  97.3 F (36.3 C)   TempSrc: Oral  Axillary   Resp: 18  20   Weight:      SpO2: 94%  98%    Weight change:   Intake/Output Summary (Last 24 hours) at 03/22/14 1538 Last data filed at 03/22/14 1442  Gross per 24 hour  Intake 379.17 ml  Output    300 ml  Net  79.17 ml   General appearance: frail elderly woman, disoriented Lungs: clear to auscultation bilaterally and no increased work of breathing Heart: tachycardic, irregularly irregular rhythm Abdomen: lower abdomen is distended, tender Extremities: extremities normal, atraumatic, no cyanosis or edema Skin: Skin color, texture, turgor normal. No rashes or lesions. Neurologic: Oriented to person only. Mental status fluctuating over course of day.  Lab Results: Basic Metabolic Panel:  Recent Labs  03/20/14 2051 03/22/14 0950  NA 137 136  K 3.4* 3.7  CL 101 103  CO2 25 20  GLUCOSE 86 100*  BUN 13 9  CREATININE 0.63 0.65  CALCIUM 9.0 9.0   Liver Function Tests:  Recent Labs  03/20/14 1251  AST 33  ALT 18  ALKPHOS 116  BILITOT 1.3*  PROT 6.4  ALBUMIN 3.5   CBC:  Recent Labs  03/20/14 1251  WBC 7.2  NEUTROABS 3.9  HGB 13.8  HCT 41.8  MCV 83.3  PLT 250   Cardiac Enzymes:  Recent Labs  03/20/14 2051  TROPONINI <0.03   CBG:  Recent Labs  03/20/14 1701 03/21/14 1212 03/21/14 1702 03/21/14 2220 03/22/14 0755 03/22/14 1212  GLUCAP 83 105* 137* 89 82 154*   Thyroid Function Tests:  Recent Labs  03/20/14 2051  TSH 3.291   Urinalysis:  Recent Labs  03/20/14 1300  COLORURINE AMBER*  LABSPEC 1.025  PHURINE 7.0  GLUCOSEU NEGATIVE  HGBUR NEGATIVE  BILIRUBINUR NEGATIVE  KETONESUR 15*  PROTEINUR 100*    UROBILINOGEN 1.0  NITRITE NEGATIVE  LEUKOCYTESUR TRACE*   Micro Results: Recent Results (from the past 240 hour(s))  Urine culture     Status: None   Collection Time: 03/20/14  1:00 PM  Result Value Ref Range Status   Specimen Description URINE, RANDOM  Final   Special Requests NONE  Final   Colony Count   Final    70,000 COLONIES/ML Performed at [facility]    Culture   Final    Multiple bacterial morphotypes present, none predominant. Suggest appropriate recollection if clinically indicated. Performed at [facility]    Report Status 03/22/2014 FINAL  Final  MRSA PCR Screening     Status: None   Collection Time: 03/20/14  8:37 PM  Result Value Ref Range Status   MRSA by PCR NEGATIVE NEGATIVE Final    Comment:        The GeneXpert MRSA Assay (FDA approved for NASAL specimens only), is one component of a comprehensive MRSA colonization surveillance program. It is not intended to diagnose MRSA infection nor to guide or monitor treatment for MRSA infections.    Studies/Results: X-ray Chest Pa And Lateral  03/21/2014   CLINICAL DATA:  Weakness, dementia  EXAM: CHEST  2 VIEW  COMPARISON:  03/20/2014  FINDINGS: Cardiac shadow is again enlarged. Aortic calcifications are again seen.  Diffuse interstitial changes are again noted and stable. Persistent but improving left basilar density slight increase in right upper lobe density is noted which is likely related to atelectasis is seen.  IMPRESSION: Improved aeration in the left base with some slight increased density in the right apex. Continued followup is recommended.   Electronically Signed   By: Inez Catalina M.D.   On: 03/21/2014 08:11   Dg Abd Portable 2v  03/22/2014   CLINICAL DATA:  Abdominal distention.  EXAM: PORTABLE ABDOMEN - 2 VIEW  COMPARISON:  None.  FINDINGS: No evidence of dilated bowel loops or air-fluid levels. No evidence of free intraperitoneal air. Pelvic phleboliths noted bilaterally. Internal  fixation hardware noted in both hips. Severe thoracolumbar degenerative changes and levoscoliosis noted as well as previous T12 vertebroplasty.  IMPRESSION: Unremarkable bowel gas pattern.  No acute findings.   Electronically Signed   By: Earle Gell M.D.   On: 03/22/2014 11:15   Medications: I have reviewed the patient's current medications. Scheduled Meds: . enoxaparin (LOVENOX) injection  30 mg Subcutaneous Q24H  . famotidine  20 mg Oral BID  . feeding supplement (ENSURE COMPLETE)  237 mL Oral BID BM  . insulin aspart  0-9 Units Subcutaneous TID WC  . [START ON 03/23/2014] levofloxacin (LEVAQUIN) IV  750 mg Intravenous Q48H  . levothyroxine  75 mcg Oral Daily  . metoprolol  50 mg Oral BID  . potassium chloride  40 mEq Oral BID  . senna-docusate  2 tablet Oral QHS  . sertraline  50 mg Oral Daily  . tiotropium  18 mcg Inhalation Daily   Continuous Infusions: . dextrose 5 % and 0.9% NaCl 50 mL/hr at 03/22/14 0941   PRN Meds:. Assessment/Plan: Principal Problem:   Senile dementia, uncomplicated Active Problems:   Atrial fibrillation   HCAP (healthcare-associated pneumonia)   Weakness   Altered mental status  Altered mental status: Likely chronic dementia with slow decline of mental status function, but could possibly have overlying delirium precipitated by infection. Possible pneumonia noted on CXR. Urine culture shows multiple bacteria morphotypes present. Specific signs of infection can be absent in the elderly, as a febrile response to infection may be blunted. Thus, increased confusion, anorexia may signal an infection. She is receiving Levaquin as it will cover both pneumonia and a UTI. Abdomen was tender and distended on exam today. - IV NS 100 mL/hr with KCl 20 mEq/L - Day 3 of levaquin IV 750 mg today - abdominal ultrasound - psych consulted for recs on dementia/ overlying delerium and possible medications to help  Chronic Atrial Fibrillation: HR ranged from 82-101,  currently 101. Last seen by cardiologist Dr. Arta Bruce on 01/19/14, who has rate control with metoprolol 50 mg BID. EF 60-65% on 10/04, mild LVH, L atrial enlargement. Cardiologist note prescribes lasix 20 mg BID, but it is not given at nursing facility. - metoprolol 50 mg BID  Iron deficiency anemia: Hbg is 13.8 on admission. Sees Dr. Marin Olp.  COPD with chronic bronchitis: According to note from Dr. Annamaria Boots on 8/13, controlled without exacerbations. Spiriva helps with breathing symptoms. - spiriva 18 mcg inhalation  Hypothyroidism: TSH 3.29 on admission. On levothyroxine 75 mcg PO qd - levothyroxine 75 mcg PO qd  Constipation: continue Senokot-S.  Depression:  - zoloft switched to wellbutrin per psych  Diet:  - heart healthy  Code Status: DNR, DNI. -Defer to [place] [phone] for decisions -Confirmed on admission that patient would want to be kept comfortable should she decline  further  Dispo: - stays in nursing home [facility] [place]   This is a Careers information officer Note.  The care of the patient was discussed with Dr. Posey Pronto and the assessment and plan formulated with their assistance.  Please see their attached note for official documentation of the daily encounter.   LOS: 2 days   Billy Fischer, Med Student 03/22/2014, 3:38 PM

## 2014-03-22 NOTE — Progress Notes (Signed)
Bladder scan on pt revealed 821ml of urine in bladder. MD paged and made aware. Order to one time in and out cath to be done. 800 ml of urine after cath. Will continue to monitor.

## 2014-03-22 NOTE — Care Management Note (Unsigned)
    Page 1 of 1   03/26/2014     1:55:42 PM CARE MANAGEMENT NOTE 03/26/2014  Patient:  [name], [name]   Account Number:  [id number]  Date Initiated:  03/21/2014  Documentation initiated by:  [facility]  Subjective/Objective Assessment:   PNA, UTI     Action/Plan:   from SNF  [facility]   Anticipated DC Date:  03/27/2014   Anticipated DC Plan:  SKILLED NURSING FACILITY  In-house referral  Clinical Social Worker      DC Planning Services  CM consult      Choice offered to / List presented to:             Status of service:  In process, will continue to follow Medicare Important Message given?  YES (If response is "NO", the following Medicare IM given date fields will be blank) Date Medicare IM given:  03/22/2014 Medicare IM given by:  [name] Date Additional Medicare IM given:  03/26/2014 Additional Medicare IM given by:  [name]  Discharge Disposition:  ASSISTED LIVING  Per UR Regulation:  Reviewed for med. necessity/level of care/duration of stay  If discussed at [place] of Stay Meetings, dates discussed:    Comments:  03/27/14 [place], BSN [phone] patient is from [place] , [place] following.  03/21/2014 1150 Chart reviewed. UR complete. Jonnie Finner RN CCM Case Mgmt phone [phone]

## 2014-03-22 NOTE — Progress Notes (Signed)
Pt morning Bp 153/146mmHg, no complaints made at this time. MD made aware, no new orders given. We will continue to monitor.

## 2014-03-22 NOTE — Clinical Documentation Improvement (Signed)
Supporting Information: Patient with AMS per 01/19 progress notes.  Labs: 01/19:  Lactic acid: 2.53.   Possible Clinical Conditions: . Bacteremia (positive blood cultures only) . Specify sepsis with UTI, versus UTI only . Sepsis-specify causative organism if known . Severe sepsis-sepsis with organ dysfunction --Specify organ dysfunction Respiratory failure Encephalopathy Acute kidney failure Other (specify) . SIRS (Systemic Inflammatory Response Syndrome --With or without organ dysfunction . Document septic shock if present . Document any associated diagnoses/conditions   Thank Sherian Maroon Documentation Specialist [phone] Lindalee [email or web address]

## 2014-03-22 NOTE — Consult Note (Signed)
[facility] Consult   Reason for Consult:  Dementia with AMS and medication managment Referring Physician:  Dr. Posey Pronto  Patient Identification: [name] MRN:  [id number] Principal Diagnosis: Senile dementia, uncomplicated Diagnosis:   Patient Active Problem List   Diagnosis Date Noted  . Altered mental status [R41.82]   . HCAP (healthcare-associated pneumonia) [J18.9] 03/20/2014  . Weakness [R53.1] 03/20/2014  . Intertrochanteric fracture of right hip [S72.141A] 10/30/2013  . Fracture of right humerus [S42.301A] 10/30/2013  . Essential hypertension, benign [I10] 10/30/2013  . CHF, chronic [I50.9] 10/30/2013  . Type II or unspecified type diabetes mellitus with unspecified complication, uncontrolled [E11.8] 10/30/2013  . Localized swelling of both lower legs [R22.43] 09/11/2013  . Atrial fibrillation [I48.91] 08/21/2013  . Iron deficiency anemia, unspecified [D50.9] 07/15/2012  . Falls [phone].[phone]] 04/07/2011  . Senile dementia, uncomplicated [U72.53] 66/44/0347  . OSTEOARTHRITIS [M19.90] 10/17/2006  . HYPOTHYROIDISM [E03.9] 10/13/2006  . ANXIETY [F41.1] 10/13/2006  . DEPRESSION [F32.9] 10/13/2006  . COPD with chronic bronchitis [J44.9] 10/13/2006  . HIATAL HERNIA WITH REFLUX [K44.9] 10/13/2006  . DIVERTICULOSIS, COLON [K57.30] 10/13/2006  . SLEEP DISORDER [G47.9] 07/20/2006    Total Time spent with patient: 45 minutes  Subjective:   [name] is a 81 y.o. female patient admitted with dementia with AMS.  HPI:  [name] is a 81 yo female seen for psychiatric consultation and evaluation of dementia with the decreased psychomotor activities, altered mental status and deterioration of cognition since he was placed in a skilled nursing facility that was 02/20/2014. Information for this evaluation of pain from the available medical records and face-to-face evaluation with patient. Reportedly her baseline is able to care for herself and feed herself which  was deteriorated over the last few weeks. Patient was treated with Depakote for agitation and other behavioral problems which made her more slowed down. Patient is a poor historian she is oriented to only her name. Patient has been a poor communicator and difficult to understand because of garbled language. No family members are available at at bedside. Patient has no previous acute psychiatric hospitalization as per the electronic health records.  Medical history: Patient with a PMHx of Atrial Fibrillation, CHF, dementia, hypothyroidism, anemia, GERD, depression, COPD, degenerative joint disease with several falls and fractures who presents today from a nursing home with weakness (as reported by nursing home). She is having difficulty forming words and is confused. She denies abdominal pain, leg pain. She says that she feels dizzy on occasion. Her answers to most questions are nonsensible. Pt is unable to provide a good history due to dementia.  According to a family friend, [name], her cognition is declined from baseline. She last saw her two weekends ago and she was confused then, but not as much as she is currently. Her friend says that at her baseline, she is very up to date with current events and can carry on great conversation. She thinks that she started to decline after her last fracture (in early December).   According to a nurse at [facility] [phone]), she has has had a general decline in cognitive function since she was admitted on 02/20/2014 after sustaining a L hip facture. However, starting over the weekend (1/15), she has had an altered mental status. Normally, she would feed herself, take herself to the bathroom, carry on conversations (although they were sometimes nonsensical), but yesterday the staff had to feed hear and she would barely eat. She did not recognize staff members and was  whimpering, crying, and incoherent. She alternates between being continent and incontinent  since she came in. The nurse did not see any documented fevers, diarrhea, vomiting. A PA yesterday noted that she was lethargic and encouraged the staff to push fluids. She was started on 10 mg zyrtec daily for 5 days. She had 1 dose yesterday. They also noted that she had worsening facial edema yesterday and mucous membranes that were dry and pale in color.   She was treated for a UTI a week ago with Amoxicillin. She was started on 125 mg Depakote sprinkles on 1/12 for behavior management (would start sundowning around 5-6 pm), but it was D/Ced on 1/18. She had a rash on her back starting on 1/13 that was thought to be a reaction to amoxicillin or depakote, but it was noted to have improved yesterday. They have had to crush her medications and in put it in her food as she is no longer swallowing pills.  She had surgery on 02/01/14 for a hip fracture. She was put on Lovenox. On 12/25, her Lovenox was decreased to qd then it was D/Ced on 1/14. She has had a history of falls with 3 significant Fractures. She has broken her hip twice.  She was given Levaquin IV 750 mg in ED due to CXR that was concerning for pneumonia   HPI Elements:  Location:  Altered mental status and dementia. Quality:  Poor. Severity:  Cognitive deterioration. Timing:  Placed in skilled nursing facility since fracture of hip.  Past Medical History:  Past Medical History  Diagnosis Date  . Hypertension   . Diabetes mellitus   . Hypothyroidism   . Vertigo   . Anemia   . Osteoarthritis   . GERD (gastroesophageal reflux disease)   . Diverticulosis   . Depression   . H/O: hysterectomy     at age 69  . COPD (chronic obstructive pulmonary disease)   . History of DVT (deep vein thrombosis)   . Anxiety   . Dyspnea   . Enlarged heart   . Fibromyalgia   . Degenerative joint disease   . Degenerative disk disease   . Tremor   . Falls   . Iron deficiency anemia, unspecified 07/15/2012  . CHF (congestive heart failure)   .  Cancer     basal cell  . Atrial fibrillation 08/21/2013  . HIATAL HERNIA WITH REFLUX 10/13/2006    Qualifier: Diagnosis of  By: Silvio Pate MD, [facility]   . COPD with chronic bronchitis 10/13/2006    Never a primary smoker, but secondhand smoke from husband by history. Emphysema and chronic bronchitis, mild.    . Senile dementia, uncomplicated 5/32/9924    Qualifier: Diagnosis of  By: Copland MD, Frederico Hamman    . VERTIGO 10/02/2008    Qualifier: Diagnosis of  By: Lorelei Pont MD, Frederico Hamman    . TREMOR, ESSENTIAL 10/13/2006    Qualifier: Diagnosis of  By: Silvio Pate MD, [facility]   . POSITIVE PPD 06/02/2007    Qualifier: Diagnosis of  By: Julien Girt CMA, Marliss Czar    . COLONIC POLYPS, HX OF 10/13/2006    Qualifier: Diagnosis of  By: Silvio Pate MD, [facility] Cancer     Past Surgical History  Procedure Laterality Date  . Cholecystectomy  1944  . Appendectomy  1952  . Ovarian cyst removal  1982    secondary ventral hematoma  . Bladder tack  1997  . Cataract extraction, bilateral    . Kyphosis surgery    . Total abdominal  hysterectomy    . Salpingoophorectomy      unilateral salpingo-oophorectomy  . Orif right hip     Family History:  Family History  Problem Relation Age of Onset  . Stroke Mother   . Cancer Father     Pancreatic Cancer  . Kidney disease Sister   . Heart disease Brother   . Stroke Brother   . Heart disease Brother    Social History:  History  Alcohol Use No     History  Drug Use No    History   Social History  . Marital Status: Widowed    Spouse Name: N/A    Number of Children: N/A  . Years of Education: N/A   Social History Main Topics  . Smoking status: Never Smoker   . Smokeless tobacco: Never Used  . Alcohol Use: No  . Drug Use: No  . Sexual Activity: No   Other Topics Concern  . None   Social History Narrative   Additional Social History:                          Allergies:   Allergies  Allergen Reactions  . Aricept [Donepezil Hydrochloride]     REACTION:  delusions  . Depakote [Divalproex [place] stated they suspect Pt is having an allergic reaction to med  . Hydrocodone-Acetaminophen     REACTION: U/K  . Metoprolol     REACTION: dizzy  . Nitrofurantoin   . Sulfonamide Derivatives     REACTION: unspecified    Vitals: Blood pressure 153/109, pulse 97, temperature 98.1 F (36.7 C), temperature source Oral, resp. rate 18, weight 42.5 kg (93 lb 11.1 oz), SpO2 94 %.  Risk to Self: Is patient at risk for suicide?: No Risk to Others:   Prior Inpatient Therapy:   Prior Outpatient Therapy:    Current Facility-Administered Medications  Medication Dose Route Frequency Provider Last Rate Last Dose  . dextrose 5 %-0.9 % sodium chloride infusion   Intravenous Continuous Ejiroghene Arlyce Dice, MD 50 mL/hr at 03/22/14 0941    . enoxaparin (LOVENOX) injection 30 mg  30 mg Subcutaneous Q24H Ejiroghene E Emokpae, MD   30 mg at 03/21/14 2316  . famotidine (PEPCID) tablet 20 mg  20 mg Oral BID Ejiroghene E Denton Brick, MD   20 mg at 03/22/14 1008  . feeding supplement (ENSURE COMPLETE) (ENSURE COMPLETE) liquid 237 mL  237 mL Oral BID BM Charlott Rakes, MD   237 mL at 03/22/14 1008  . insulin aspart (novoLOG) injection 0-9 Units  0-9 Units Subcutaneous TID WC Kelby Aline, MD   0 Units at 03/22/14 0800  . levothyroxine (SYNTHROID, LEVOTHROID) tablet 75 mcg  75 mcg Oral Daily Ejiroghene Arlyce Dice, MD   75 mcg at 03/22/14 1008  . metoprolol (LOPRESSOR) tablet 50 mg  50 mg Oral BID Ejiroghene Arlyce Dice, MD   50 mg at 03/22/14 1008  . potassium chloride SA (K-DUR,KLOR-CON) CR tablet 40 mEq  40 mEq Oral BID Lora Poteet Seay, RPH   40 mEq at 03/22/14 1008  . senna-docusate (Senokot-S) tablet 2 tablet  2 tablet Oral QHS Ejiroghene Arlyce Dice, MD   2 tablet at 03/20/14 2200  . sertraline (ZOLOFT) tablet 50 mg  50 mg Oral Daily Ejiroghene E Emokpae, MD   50 mg at 03/22/14 1008  . tiotropium (SPIRIVA) inhalation capsule 18 mcg  18 mcg Inhalation Daily  Ejiroghene Arlyce Dice, MD  18 mcg at 03/21/14 0744    Musculoskeletal: Strength & Muscle Tone: decreased Gait & Station: unable to stand Patient leans: N/A  Psychiatric Specialty Exam: Physical Exam as per the history and physical   ROS generalized weakness, cognitive deterioration and poor historian   Blood pressure 153/109, pulse 97, temperature 98.1 F (36.7 C), temperature source Oral, resp. rate 18, weight 42.5 kg (93 lb 11.1 oz), SpO2 94 %.Body mass index is 20.27 kg/(m^2).  General Appearance: Guarded  Eye Contact::  Fair  Speech:  Garbled and Slow  Volume:  Decreased  Mood:  Anxious and Depressed  Affect:  Depressed  Thought Process:  Irrelevant and Loose  Orientation:  Other:  Oriented to her name only  Thought Content:  Rumination  Suicidal Thoughts:  No  Homicidal Thoughts:  No  Memory:  Immediate;   Poor Recent;   Poor  Judgement:  Impaired  Insight:  Shallow  Psychomotor Activity:  Decreased  Concentration:  Poor  Recall:  Poor  Fund of Knowledge:Poor  Language: Fair  Akathisia:  NA  Handed:  Right  AIMS (if indicated):     Assets:  Catering manager Housing Leisure Time Social Support  ADL's:  Impaired  Cognition: Impaired,  Severe  Sleep:      Medical Decision Making: Self-Limited or Minor (1), New problem, with additional work up planned, Review of Psycho-Social Stressors (1), Review or order clinical lab tests (1), Discuss test with performing physician (1), Established Problem, Worsening (2), Review or order medicine tests (1), Independent Review of image, tracing or specimen (2), Review of Medication Regimen & Side Effects (2) and Review of New Medication or Change in Dosage (2)  Treatment Plan Summary: Daily contact with patient to assess and evaluate symptoms and progress in treatment and Medication management  Plan:  Discontinue Zoloft which is not helpful We will start Wellbutrin SR 100 mg daily and risperidone 0.5 mg twice daily  for agitationSupportive therapy provided about ongoing stressors.   Disposition: Patient may return to skilled nursing facility when medically stable  Ezariah Nace,JANARDHAHA R. 03/22/2014 11:15 AM

## 2014-03-22 NOTE — Progress Notes (Signed)
Internal Medicine Attending  Date: 03/22/2014  Patient name: [name] Medical record number: [id number] Date of birth: 27-Aug-1924 Age: 81 y.o. Gender: female  I saw and evaluated the patient, and discussed her care with resident on A.M rounds.  I reviewed the resident's note by Dr. Posey Pronto and I agree with the resident's findings and plans as documented in his note.

## 2014-03-22 NOTE — Progress Notes (Addendum)
Subjective: This AM, she appeared stable from yesterday. She reported wanting to eat breakfast. Per her nursing home, sitter would not disqualify her from coming back as she is already on the memory unit.   Objective: Vital signs in last 24 hours: Filed Vitals:   03/22/14 0513 03/22/14 0523 03/22/14 1441 03/22/14 1448  BP: 156/102 153/109 149/119 140/98  Pulse: 97  101   Temp: 98.1 F (36.7 C)  97.3 F (36.3 C)   TempSrc: Oral  Axillary   Resp: 18  20   Weight:      SpO2: 94%  98%    Weight change:   Intake/Output Summary (Last 24 hours) at 03/22/14 1548 Last data filed at 03/22/14 1442  Gross per 24 hour  Intake 379.17 ml  Output    300 ml  Net  79.17 ml   General: resting in bed, NAD HEENT: PERRL, EOMI, no scleral icterus Cardiac: RRR, no rubs, murmurs or gallops Pulm: clear to auscultation bilaterally, no wheezes, rales, or rhonchi in the anterior lung fields Abd: soft, distended though difficult to assess how tender it actually is her expressions fluctuate rapidly Ext: warm and well perfused, DP pulses present bilaterally L>R, no pedal edema Neuro: responds to questions appropriately; moving all extremities freely    Lab Results: Basic Metabolic Panel:  Recent Labs Lab 03/20/14 2051 03/22/14 0950  NA 137 136  K 3.4* 3.7  CL 101 103  CO2 25 20  GLUCOSE 86 100*  BUN 13 9  CREATININE 0.63 0.65  CALCIUM 9.0 9.0   Liver Function Tests:  Recent Labs Lab 03/20/14 1251  AST 33  ALT 18  ALKPHOS 116  BILITOT 1.3*  PROT 6.4  ALBUMIN 3.5   CBC:  Recent Labs Lab 03/20/14 1251  WBC 7.2  NEUTROABS 3.9  HGB 13.8  HCT 41.8  MCV 83.3  PLT 250   Cardiac Enzymes:  Recent Labs Lab 03/20/14 2051  TROPONINI <0.03   CBG:  Recent Labs Lab 03/20/14 1701 03/21/14 1212 03/21/14 1702 03/21/14 2220 03/22/14 0755 03/22/14 1212  GLUCAP 83 105* 137* 89 82 154*   Thyroid Function Tests:  Recent Labs Lab 03/20/14 2051  TSH 3.291    Urinalysis:  Recent Labs Lab 03/20/14 1300  COLORURINE AMBER*  LABSPEC 1.025  PHURINE 7.0  GLUCOSEU NEGATIVE  HGBUR NEGATIVE  BILIRUBINUR NEGATIVE  KETONESUR 15*  PROTEINUR 100*  UROBILINOGEN 1.0  NITRITE NEGATIVE  LEUKOCYTESUR TRACE*     Micro Results: Recent Results (from the past 240 hour(s))  Urine culture     Status: None   Collection Time: 03/20/14  1:00 PM  Result Value Ref Range Status   Specimen Description URINE, RANDOM  Final   Special Requests NONE  Final   Colony Count   Final    70,000 COLONIES/ML Performed at [facility]    Culture   Final    Multiple bacterial morphotypes present, none predominant. Suggest appropriate recollection if clinically indicated. Performed at [facility]    Report Status 03/22/2014 FINAL  Final  MRSA PCR Screening     Status: None   Collection Time: 03/20/14  8:37 PM  Result Value Ref Range Status   MRSA by PCR NEGATIVE NEGATIVE Final    Comment:        The GeneXpert MRSA Assay (FDA approved for NASAL specimens only), is one component of a comprehensive MRSA colonization surveillance program. It is not intended to diagnose MRSA infection nor to guide or monitor treatment  for MRSA infections.    Studies/Results: X-ray Chest Pa And Lateral  03/21/2014   CLINICAL DATA:  Weakness, dementia  EXAM: CHEST  2 VIEW  COMPARISON:  03/20/2014  FINDINGS: Cardiac shadow is again enlarged. Aortic calcifications are again seen. Diffuse interstitial changes are again noted and stable. Persistent but improving left basilar density slight increase in right upper lobe density is noted which is likely related to atelectasis is seen.  IMPRESSION: Improved aeration in the left base with some slight increased density in the right apex. Continued followup is recommended.   Electronically Signed   By: Inez Catalina M.D.   On: 03/21/2014 08:11   Dg Abd Portable 2v  03/22/2014   CLINICAL DATA:  Abdominal distention.  EXAM:  PORTABLE ABDOMEN - 2 VIEW  COMPARISON:  None.  FINDINGS: No evidence of dilated bowel loops or air-fluid levels. No evidence of free intraperitoneal air. Pelvic phleboliths noted bilaterally. Internal fixation hardware noted in both hips. Severe thoracolumbar degenerative changes and levoscoliosis noted as well as previous T12 vertebroplasty.  IMPRESSION: Unremarkable bowel gas pattern.  No acute findings.   Electronically Signed   By: Earle Gell M.D.   On: 03/22/2014 11:15   Medications: I have reviewed the patient's current medications. Scheduled Meds: . buPROPion  100 mg Oral Daily  . enoxaparin (LOVENOX) injection  30 mg Subcutaneous Q24H  . famotidine  20 mg Oral BID  . feeding supplement (ENSURE COMPLETE)  237 mL Oral BID BM  . insulin aspart  0-9 Units Subcutaneous TID WC  . [START ON 03/23/2014] levofloxacin (LEVAQUIN) IV  750 mg Intravenous Q48H  . levothyroxine  75 mcg Oral Daily  . metoprolol  50 mg Oral BID  . potassium chloride  40 mEq Oral BID  . risperiDONE  0.25 mg Oral BID  . senna-docusate  2 tablet Oral QHS  . tiotropium  18 mcg Inhalation Daily   Continuous Infusions: . dextrose 5 % and 0.9% NaCl 50 mL/hr at 03/22/14 0941   PRN Meds:. Assessment/Plan: Abdominal distension: Last BM on 1/19 [day of admission].  Abdominal XR unremarkable for bowel obstruction though notable for scoliosis which could exaggerate abdominal wall protrusion. -Check abdominal US  Altered mental status: CXR findings suggestive of PNA as are UA findings for a UTI. Urine culture with polymicrobial growth. Received Levaquin x 2. Consulted psychiatry as well for additional medication recs. Of note, Aricept was noted as an allergy though should be clarified where exactly it was noted as she had delusions. Per RN, she didn't need a sitter this AM so will discontinue.  -Abx Day 3: Continue Levaquin 750mg  IV -Start Risperdal & Wellbutrin per Psych recs -Continue sitter for fall risk -Update  HCPOA  Chronic a-fib: Last seen by Dr. Rockey Situ [Cardiology] on 01/19/14 at which time anticoagulation was deferred given her fall risk. Echo with EF 55-60% with mild LVH, L atrial enlargement, pulmonary HTN in September 2011.  -Continue metoprolol 50mg  BID  DM2: A1c 7.7, April 2011. CBGs trending mostly 100s though a few overnight events of 82 and 89. -Check CBGs TID WC & SSI-S   Hypothyroidism: Continue Synthroid 30mcg.  COPD: Continue Spiriva   GERD: Continue Pepcid   Constipation: Continue Senokot-S.  Depression: Continue Zoloft.  #FEN:  -Diet: Heart Healthy  #DVT prophylaxis: Lovenox  #CODE STATUS: DNR/DNI -Defer to [place] [phone] for decisions -Confirmed on admission that patient would want to be kept comfortable should she decline further  Dispo: Disposition is deferred at this time,  awaiting improvement of current medical problems.     The patient does have a current PCP [place] Pink, MD) and does not need an [facility] hospital follow-up appointment after discharge.  The patient does not have transportation limitations that hinder transportation to clinic appointments.  .Services Needed at time of discharge: Y = Yes, Blank = No PT:   OT:   RN:   Equipment:   Other:     LOS: 2 days   Charlott Rakes, MD 03/22/2014, 3:48 PM

## 2014-03-22 NOTE — Progress Notes (Signed)
INITIAL NUTRITION ASSESSMENT  DOCUMENTATION CODES Per approved criteria  -Severe malnutrition in the context of chronic illness  Pt meets criteria for severe MALNUTRITION in the context of chronic illness as evidenced by 21% wt loss in <3 months and moderate fat and muscle wasting.  INTERVENTION: - Once diet advanced, recommend Ensure Complete po BID, each supplement provides 350 kcal and 13 grams of protein - Magic cup TID with meals, each supplement provides 290 kcal and 9 grams of protein - RD will continue to monitor  NUTRITION DIAGNOSIS: Inadequate oral intake related to dementia as evidenced by poor po and wt loss.   Goal: Pt to meet >/= 90% of their estimated nutrition needs   Monitor:  Weight trend, po intake, acceptance of supplements, labs  Reason for Assessment: MST  81 y.o. female  Admitting Dx: Senile dementia, uncomplicated  ASSESSMENT: 81 yo female with a PMHx of Atrial Fibrillation, CHF, dementia, hypothyroidism, anemia, GERD, depression, COPD, degenerative joint disease with several falls and fractures who presents today from a nursing home with weakness (as reported by nursing home).  - Pt confused during RD visit.  - Per RN, pt ate bites of breakfast this morning and sips of ensure and bites of applesauce this afternoon. Pt currently NPO for abdominal ultrasound.  - Pt needs feeding assist.  - Per chart history, pt has had 21% weight loss since November 2015.   Nutrition Focused Physical Exam:  Subcutaneous Fat:  Orbital Region: moderate depletion Upper Arm Region: moderate depletion Thoracic and Lumbar Region: mild depletion  Muscle:  Temple Region: mild to moderate depletion Clavicle Bone Region: moderate depletion Clavicle and Acromion Bone Region: moderate depletion Scapular Bone Region: WNL Dorsal Hand: moderate depletion Patellar Region: moderate depletion Anterior Thigh Region: mild to moderate depletion Posterior Calf Region: moderate  depletion  Edema: none  Labs Reviewed  Height: Ht Readings from Last 1 Encounters:  01/19/14 4\' 9"  (1.448 m)    Weight: Wt Readings from Last 1 Encounters:  03/20/14 93 lb 11.1 oz (42.5 kg)    Ideal Body Weight: 85 lbs  % Ideal Body Weight: 109%  Wt Readings from Last 10 Encounters:  03/20/14 93 lb 11.1 oz (42.5 kg)  01/19/14 117 lb (53.071 kg)  11/16/13 120 lb (54.432 kg)  11/02/13 120 lb (54.432 kg)  10/30/13 121 lb (54.885 kg)  10/13/13 121 lb 8 oz (55.112 kg)  10/12/13 121 lb 6.4 oz (55.067 kg)  09/11/13 119 lb 8 oz (54.205 kg)  08/21/13 120 lb 8 oz (54.658 kg)  07/10/13 128 lb (58.06 kg)    Usual Body Weight: 117 lbs  % Usual Body Weight: 79%  BMI:  Body mass index is 20.27 kg/(m^2).  Estimated Nutritional Needs: Kcal: 1200-1400 Protein: 60-70 g Fluid: 1.4 L/day  Skin: Intact  Diet Order: Diet Heart  EDUCATION NEEDS: -Education needs addressed   Intake/Output Summary (Last 24 hours) at 03/22/14 1521 Last data filed at 03/22/14 1442  Gross per 24 hour  Intake 379.17 ml  Output    300 ml  Net  79.17 ml    Last BM: Prior to admission   Labs:   Recent Labs Lab 03/20/14 1251 03/20/14 2051 03/22/14 0950  NA 138 137 136  K 3.8 3.4* 3.7  CL 101 101 103  CO2 23 25 20   BUN 18 13 9   CREATININE 0.70 0.63 0.65  CALCIUM 9.2 9.0 9.0  GLUCOSE 113* 86 100*    CBG (last 3)   Recent Labs  03/21/14  2220 03/22/14 0755 03/22/14 1212  GLUCAP 89 82 154*    Scheduled Meds: . enoxaparin (LOVENOX) injection  30 mg Subcutaneous Q24H  . famotidine  20 mg Oral BID  . feeding supplement (ENSURE COMPLETE)  237 mL Oral BID BM  . insulin aspart  0-9 Units Subcutaneous TID WC  . [START ON 03/23/2014] levofloxacin (LEVAQUIN) IV  750 mg Intravenous Q48H  . levothyroxine  75 mcg Oral Daily  . metoprolol  50 mg Oral BID  . potassium chloride  40 mEq Oral BID  . senna-docusate  2 tablet Oral QHS  . sertraline  50 mg Oral Daily  . tiotropium  18 mcg  Inhalation Daily    Continuous Infusions: . dextrose 5 % and 0.9% NaCl 50 mL/hr at 03/22/14 0941    Past Medical History  Diagnosis Date  . Hypertension   . Diabetes mellitus   . Hypothyroidism   . Vertigo   . Anemia   . Osteoarthritis   . GERD (gastroesophageal reflux disease)   . Diverticulosis   . Depression   . H/O: hysterectomy     at age 17  . COPD (chronic obstructive pulmonary disease)   . History of DVT (deep vein thrombosis)   . Anxiety   . Dyspnea   . Enlarged heart   . Fibromyalgia   . Degenerative joint disease   . Degenerative disk disease   . Tremor   . Falls   . Iron deficiency anemia, unspecified 07/15/2012  . CHF (congestive heart failure)   . Cancer     basal cell  . Atrial fibrillation 08/21/2013  . HIATAL HERNIA WITH REFLUX 10/13/2006    Qualifier: Diagnosis of  By: Silvio Pate MD, [facility] Cancer   . COPD with chronic bronchitis 10/13/2006    Never a primary smoker, but secondhand smoke from husband by history. Emphysema and chronic bronchitis, mild.    . Senile dementia, uncomplicated 08/21/2977    Qualifier: Diagnosis of  By: Copland MD, Frederico Hamman    . VERTIGO 10/02/2008    Qualifier: Diagnosis of  By: Lorelei Pont MD, Frederico Hamman    . TREMOR, ESSENTIAL 10/13/2006    Qualifier: Diagnosis of  By: Silvio Pate MD, [facility] Cancer   . POSITIVE PPD 06/02/2007    Qualifier: Diagnosis of  By: Julien Girt CMA, Marliss Czar    . COLONIC POLYPS, HX OF 10/13/2006    Qualifier: Diagnosis of  By: Silvio Pate MD, Baird Cancer     Past Surgical History  Procedure Laterality Date  . Cholecystectomy  1944  . Appendectomy  1952  . Ovarian cyst removal  1982    secondary ventral hematoma  . Bladder tack  1997  . Cataract extraction, bilateral    . Kyphosis surgery    . Total abdominal hysterectomy    . Salpingoophorectomy      unilateral salpingo-oophorectomy  . Orif right hip      Laurette Schimke MS, RD, LDN

## 2014-03-23 DIAGNOSIS — R339 Retention of urine, unspecified: Secondary | ICD-10-CM | POA: Insufficient documentation

## 2014-03-23 LAB — GLUCOSE, CAPILLARY
GLUCOSE-CAPILLARY: 135 mg/dL — AB (ref 70–99)
GLUCOSE-CAPILLARY: 147 mg/dL — AB (ref 70–99)
GLUCOSE-CAPILLARY: 120 mg/dL — AB (ref 70–99)
Glucose-Capillary: 113 mg/dL — ABNORMAL HIGH (ref 70–99)

## 2014-03-23 NOTE — Progress Notes (Signed)
Internal Medicine Attending  Date: 03/23/2014  Patient name: [name] Medical record number: [id number] Date of birth: 1924/09/22 Age: 81 y.o. Gender: female  I saw and evaluated the patient. I discussed patient and reviewed the resident's note by Dr. Posey Pronto, and I agree with the resident's findings and plans as documented in his note, with the following additional comments.  Abdominal ultrasound was done yesterday to evaluate a distended tender area in patient's lower abdomen and showed moderate urinary bladder distention; in-and-out urinary catheterization returned 800 mL's of urine.  Patient's urinary retention has likely contributed to her episodes of agitation.  A bladder scan today showed 400 mL's of urine; the plan is to manage in the short-term with in-and-out urinary catheterization in conjunction with regular bladder scans.  If her urinary retention does not resolve, then she will urologic evaluation.

## 2014-03-23 NOTE — Progress Notes (Addendum)
Subjective: Abdominal US with moderately distended bladder.  This AM, she was asleep on exam. I attempted to wake her up though she was not agreeable to it. Overnight, her systolic BP was elevated 235T-614E. She was also found to have bladder scan with ~890mL of urine with I&O cath of 818mL.   Objective: Vital signs in last 24 hours: Filed Vitals:   03/22/14 1448 03/22/14 2115 03/23/14 0556 03/23/14 0619  BP: 140/98 148/80 181/106 174/95  Pulse:  99 78   Temp:  98.5 F (36.9 C) 97.5 F (36.4 C)   TempSrc:  Oral Tympanic   Resp:  20 15   Weight:      SpO2:  97% 100%    Weight change:   Intake/Output Summary (Last 24 hours) at 03/23/14 0843 Last data filed at 03/23/14 0413  Gross per 24 hour  Intake 674.17 ml  Output    300 ml  Net 374.17 ml   General: resting in bed, NAD HEENT: PERRL, EOMI, no scleral icterus Cardiac: RRR, no rubs, murmurs or gallops Pulm: clear to auscultation bilaterally, no wheezes, rales, or rhonchi in the anterior lung fields Abd: unable to exam given that she was not agreeable to it Ext: warm and well perfused, DP pulses present bilaterally L>R, no pedal edema Neuro: responds to questions appropriately; moving all extremities freely    Lab Results: Basic Metabolic Panel:  Recent Labs Lab 03/20/14 2051 03/22/14 0950  NA 137 136  K 3.4* 3.7  CL 101 103  CO2 25 20  GLUCOSE 86 100*  BUN 13 9  CREATININE 0.63 0.65  CALCIUM 9.0 9.0   Liver Function Tests:  Recent Labs Lab 03/20/14 1251  AST 33  ALT 18  ALKPHOS 116  BILITOT 1.3*  PROT 6.4  ALBUMIN 3.5   CBC:  Recent Labs Lab 03/20/14 1251  WBC 7.2  NEUTROABS 3.9  HGB 13.8  HCT 41.8  MCV 83.3  PLT 250   Cardiac Enzymes:  Recent Labs Lab 03/20/14 2051  TROPONINI <0.03   CBG:  Recent Labs Lab 03/21/14 2220 03/22/14 0755 03/22/14 1212 03/22/14 1736 03/22/14 2104 03/23/14 0748  GLUCAP 89 82 154* 102* 111* 135*   Thyroid Function Tests:  Recent Labs Lab  03/20/14 2051  TSH 3.291   Urinalysis:  Recent Labs Lab 03/20/14 1300  COLORURINE AMBER*  LABSPEC 1.025  PHURINE 7.0  GLUCOSEU NEGATIVE  HGBUR NEGATIVE  BILIRUBINUR NEGATIVE  KETONESUR 15*  PROTEINUR 100*  UROBILINOGEN 1.0  NITRITE NEGATIVE  LEUKOCYTESUR TRACE*     Micro Results: Recent Results (from the past 240 hour(s))  Urine culture     Status: None   Collection Time: 03/20/14  1:00 PM  Result Value Ref Range Status   Specimen Description URINE, RANDOM  Final   Special Requests NONE  Final   Colony Count   Final    70,000 COLONIES/ML Performed at [facility]    Culture   Final    Multiple bacterial morphotypes present, none predominant. Suggest appropriate recollection if clinically indicated. Performed at [facility]    Report Status 03/22/2014 FINAL  Final  MRSA PCR Screening     Status: None   Collection Time: 03/20/14  8:37 PM  Result Value Ref Range Status   MRSA by PCR NEGATIVE NEGATIVE Final    Comment:        The GeneXpert MRSA Assay (FDA approved for NASAL specimens only), is one component of a comprehensive MRSA colonization surveillance program. It  is not intended to diagnose MRSA infection nor to guide or monitor treatment for MRSA infections.    Studies/Results: US Abdomen Complete  03/22/2014   CLINICAL DATA:  Abdominal distention.  EXAM: ULTRASOUND ABDOMEN COMPLETE  COMPARISON:  Ultrasound of December 11, 2013.  FINDINGS: Gallbladder: Status post cholecystectomy.  Common bile duct: Diameter: Not visualized due to overlying bowel gas and lack of patient cooperation.  Liver: Limited visualization is noted, but no focal abnormality is seen.  IVC: Not visualized due to overlying bowel gas and lack of patient cooperation.  Pancreas: Not visualized due to overlying bowel gas and lack of patient cooperation.  Spleen: Not visualized due to overlying bowel gas and lack of patient cooperation.  Right Kidney: Not visualized due to lack  of patient cooperation.  Left Kidney: Measures 7.9 cm in length. Not well visualized due to lack of patient cooperation.  Abdominal aorta: Not visualized due to lack of patient cooperation.  Other findings: Moderate urinary bladder distention is noted.  IMPRESSION: Moderate urinary bladder distention. No definite abnormality seen in the visualized portion of the liver. Mild left renal atrophy is noted. Status post cholecystectomy. Remaining structures are not visualized due to combination of overlying bowel gas and lack of patient cooperation.   Electronically Signed   By: Sabino Dick M.D.   On: 03/22/2014 16:54   Dg Abd Portable 2v  03/22/2014   CLINICAL DATA:  Abdominal distention.  EXAM: PORTABLE ABDOMEN - 2 VIEW  COMPARISON:  None.  FINDINGS: No evidence of dilated bowel loops or air-fluid levels. No evidence of free intraperitoneal air. Pelvic phleboliths noted bilaterally. Internal fixation hardware noted in both hips. Severe thoracolumbar degenerative changes and levoscoliosis noted as well as previous T12 vertebroplasty.  IMPRESSION: Unremarkable bowel gas pattern.  No acute findings.   Electronically Signed   By: Earle Gell M.D.   On: 03/22/2014 11:15   Medications: I have reviewed the patient's current medications. Scheduled Meds: . buPROPion  100 mg Oral Daily  . enoxaparin (LOVENOX) injection  30 mg Subcutaneous Q24H  . famotidine  20 mg Oral BID  . feeding supplement (ENSURE COMPLETE)  237 mL Oral BID BM  . insulin aspart  0-9 Units Subcutaneous TID WC  . levofloxacin (LEVAQUIN) IV  750 mg Intravenous Q48H  . levothyroxine  75 mcg Oral Daily  . metoprolol  50 mg Oral BID  . potassium chloride  40 mEq Oral BID  . risperiDONE  0.25 mg Oral BID  . senna-docusate  2 tablet Oral QHS  . tiotropium  18 mcg Inhalation Daily   Continuous Infusions: . dextrose 5 % and 0.9% NaCl 50 mL/hr at 03/22/14 0941   PRN Meds:. Assessment/Plan: Abdominal distension: Abdominal US remarkable for  distended bladder. Unable to assess if it's resolved and will require positioning her properly given scoliosis.  -Reassess later today when she is awake  Altered mental status: CXR findings suggestive of PNA as are UA findings for a UTI. Urine culture with polymicrobial growth. Received Levaquin x 2 though will be dosed q48h per Pharmacy given her creatinine clearance. Reordered sitter overnight in light of new medication changes.   -Abx Day 3: Continue Levaquin 750mg  IV tomorrow -Continue Risperdal & Wellbutrin per Psych recs -Continue sitter for fall risk -Update HCPOA  Chronic a-fib: Last seen by Dr. Rockey Situ [Cardiology] on 01/19/14 at which time anticoagulation was deferred given her fall risk. Echo with EF 55-60% with mild LVH, L atrial enlargement, pulmonary HTN in September 2011.  -Continue  metoprolol 50mg  BID  DM2: A1c 7.7, April 2011. CBGs trending mostly 100s. -Check CBGs TID WC & SSI-S   Hypothyroidism: Continue Synthroid 79mcg.  COPD: Continue Spiriva   GERD: Continue Pepcid   Constipation: Continue Senokot-S.  Depression: Continue Zoloft.  #FEN:  -Diet: Heart Healthy  #DVT prophylaxis: Lovenox  #CODE STATUS: DNR/DNI -Defer to [place] [phone] for decisions -Confirmed on admission that patient would want to be kept comfortable should she decline further  Dispo: Disposition is deferred at this time, awaiting improvement of current medical problems.     The patient does have a current PCP [place] Pink, MD) and does not need an [facility] hospital follow-up appointment after discharge.  The patient does not have transportation limitations that hinder transportation to clinic appointments.  .Services Needed at time of discharge: Y = Yes, Blank = No PT:   OT:   RN:   Equipment:   Other:     LOS: 3 days   Charlott Rakes, MD 03/23/2014, 8:43 AM

## 2014-03-24 LAB — BASIC METABOLIC PANEL
Anion gap: 10 (ref 5–15)
BUN: 11 mg/dL (ref 6–23)
CALCIUM: 8.9 mg/dL (ref 8.4–10.5)
CHLORIDE: 104 mmol/L (ref 96–112)
CO2: 23 mmol/L (ref 19–32)
Creatinine, Ser: 0.65 mg/dL (ref 0.50–1.10)
GFR calc Af Amer: 90 mL/min — ABNORMAL LOW (ref >90)
GFR, EST NON AFRICAN AMERICAN: 78 mL/min — AB (ref >90)
GLUCOSE: 119 mg/dL — AB (ref 70–99)
Potassium: 3.5 mmol/L (ref 3.5–5.1)
SODIUM: 137 mmol/L (ref 135–145)

## 2014-03-24 LAB — GLUCOSE, CAPILLARY
Glucose-Capillary: 130 mg/dL — ABNORMAL HIGH (ref 70–99)
Glucose-Capillary: 109 mg/dL — ABNORMAL HIGH (ref 70–99)
Glucose-Capillary: 146 mg/dL — ABNORMAL HIGH (ref 70–99)
Glucose-Capillary: 141 mg/dL — ABNORMAL HIGH (ref 70–99)

## 2014-03-24 MED ORDER — RISPERIDONE 0.25 MG PO TABS
0.2500 mg | ORAL_TABLET | Freq: Every day | ORAL | Status: DC
  Administered 2014-03-24: 0.25 mg via ORAL
  Filled 2014-03-24 (×2): qty 1

## 2014-03-24 NOTE — Progress Notes (Signed)
Bladder scan showed 361cc of urine in bladder. RN obtained 400cc of urine via in and out cath. Will continue to monitor.

## 2014-03-24 NOTE — Progress Notes (Signed)
Subjective: This AM, she was arousable on exam though preferred to stay asleep. She answers my questions with mainly yes/no and did open her eyes somewhat.   Objective: Vital signs in last 24 hours: Filed Vitals:   03/23/14 0619 03/23/14 1311 03/23/14 2149 03/24/14 0555  BP: 174/95 123/90 154/112 148/79  Pulse:   74 77  Temp:  98.4 F (36.9 C) 97.8 F (36.6 C) 98.4 F (36.9 C)  TempSrc:  Oral    Resp:  18  15  Weight:      SpO2:  96% 96% 97%   Weight change:   Intake/Output Summary (Last 24 hours) at 03/24/14 0949 Last data filed at 03/24/14 0700  Gross per 24 hour  Intake 1490.84 ml  Output    800 ml  Net 690.84 ml   General: resting in bed, NAD HEENT: PERRL, EOMI, no scleral icterus Cardiac: RRR, no rubs, murmurs or gallops Pulm: clear to auscultation bilaterally, no wheezes, rales, or rhonchi in the anterior lung fields Abd:  Ext: warm and well perfused, DP pulses present bilaterally L>R, no pedal edema Neuro: responds to questions with yes/no; unable to assess orientation this AM; moving all extremities freely    Lab Results: Basic Metabolic Panel:  Recent Labs Lab 03/22/14 0950 03/24/14 0434  NA 136 137  K 3.7 3.5  CL 103 104  CO2 20 23  GLUCOSE 100* 119*  BUN 9 11  CREATININE 0.65 0.65  CALCIUM 9.0 8.9   Liver Function Tests:  Recent Labs Lab 03/20/14 1251  AST 33  ALT 18  ALKPHOS 116  BILITOT 1.3*  PROT 6.4  ALBUMIN 3.5   CBC:  Recent Labs Lab 03/20/14 1251  WBC 7.2  NEUTROABS 3.9  HGB 13.8  HCT 41.8  MCV 83.3  PLT 250   Cardiac Enzymes:  Recent Labs Lab 03/20/14 2051  TROPONINI <0.03   CBG:  Recent Labs Lab 03/22/14 2104 03/23/14 0748 03/23/14 1204 03/23/14 1648 03/23/14 2143 03/24/14 0846  GLUCAP 111* 135* 113* 147* 120* 130*   Thyroid Function Tests:  Recent Labs Lab 03/20/14 2051  TSH 3.291   Urinalysis:  Recent Labs Lab 03/20/14 1300  COLORURINE AMBER*  LABSPEC 1.025  PHURINE 7.0  GLUCOSEU  NEGATIVE  HGBUR NEGATIVE  BILIRUBINUR NEGATIVE  KETONESUR 15*  PROTEINUR 100*  UROBILINOGEN 1.0  NITRITE NEGATIVE  LEUKOCYTESUR TRACE*     Micro Results: Recent Results (from the past 240 hour(s))  Urine culture     Status: None   Collection Time: 03/20/14  1:00 PM  Result Value Ref Range Status   Specimen Description URINE, RANDOM  Final   Special Requests NONE  Final   Colony Count   Final    70,000 COLONIES/ML Performed at [facility]    Culture   Final    Multiple bacterial morphotypes present, none predominant. Suggest appropriate recollection if clinically indicated. Performed at [facility]    Report Status 03/22/2014 FINAL  Final  MRSA PCR Screening     Status: None   Collection Time: 03/20/14  8:37 PM  Result Value Ref Range Status   MRSA by PCR NEGATIVE NEGATIVE Final    Comment:        The GeneXpert MRSA Assay (FDA approved for NASAL specimens only), is one component of a comprehensive MRSA colonization surveillance program. It is not intended to diagnose MRSA infection nor to guide or monitor treatment for MRSA infections.    Studies/Results: US Abdomen Complete  03/22/2014  CLINICAL DATA:  Abdominal distention.  EXAM: ULTRASOUND ABDOMEN COMPLETE  COMPARISON:  Ultrasound of December 11, 2013.  FINDINGS: Gallbladder: Status post cholecystectomy.  Common bile duct: Diameter: Not visualized due to overlying bowel gas and lack of patient cooperation.  Liver: Limited visualization is noted, but no focal abnormality is seen.  IVC: Not visualized due to overlying bowel gas and lack of patient cooperation.  Pancreas: Not visualized due to overlying bowel gas and lack of patient cooperation.  Spleen: Not visualized due to overlying bowel gas and lack of patient cooperation.  Right Kidney: Not visualized due to lack of patient cooperation.  Left Kidney: Measures 7.9 cm in length. Not well visualized due to lack of patient cooperation.  Abdominal aorta:  Not visualized due to lack of patient cooperation.  Other findings: Moderate urinary bladder distention is noted.  IMPRESSION: Moderate urinary bladder distention. No definite abnormality seen in the visualized portion of the liver. Mild left renal atrophy is noted. Status post cholecystectomy. Remaining structures are not visualized due to combination of overlying bowel gas and lack of patient cooperation.   Electronically Signed   By: Sabino Dick M.D.   On: 03/22/2014 16:54   Dg Abd Portable 2v  03/22/2014   CLINICAL DATA:  Abdominal distention.  EXAM: PORTABLE ABDOMEN - 2 VIEW  COMPARISON:  None.  FINDINGS: No evidence of dilated bowel loops or air-fluid levels. No evidence of free intraperitoneal air. Pelvic phleboliths noted bilaterally. Internal fixation hardware noted in both hips. Severe thoracolumbar degenerative changes and levoscoliosis noted as well as previous T12 vertebroplasty.  IMPRESSION: Unremarkable bowel gas pattern.  No acute findings.   Electronically Signed   By: Earle Gell M.D.   On: 03/22/2014 11:15   Medications: I have reviewed the patient's current medications. Scheduled Meds: . buPROPion  100 mg Oral Daily  . enoxaparin (LOVENOX) injection  30 mg Subcutaneous Q24H  . famotidine  20 mg Oral BID  . feeding supplement (ENSURE COMPLETE)  237 mL Oral BID BM  . insulin aspart  0-9 Units Subcutaneous TID WC  . levofloxacin (LEVAQUIN) IV  750 mg Intravenous Q48H  . levothyroxine  75 mcg Oral Daily  . metoprolol  50 mg Oral BID  . risperiDONE  0.25 mg Oral QHS  . senna-docusate  2 tablet Oral QHS  . tiotropium  18 mcg Inhalation Daily   Continuous Infusions: . dextrose 5 % and 0.9% NaCl 50 mL/hr at 03/23/14 1028   PRN Meds:. Assessment/Plan: Acute urinary retention: Per nursing home and HCPOA yesterday, she has been able to void independently despite her progressive dementia, and her inability to void is a new change in functional status for her. Spoke with Dr. Alinda Money  [Urology] yesterday who recommended continuing to bladder scan with I&O cath if >400cc urine and to consider Foley with outpatient urodynamic studies if this indeed is a new change for her. On exam, no signs of grimace/distress elicited with abdominal palpation is reassuring. Possible etiology includes medications, anatomical, trauma. She has been on SSRI for a number of years and was only recently transitioned to Wellbutrin and is less contributory. No recent history of trauma. Anatomical causes cannot be ruled out without a pelvic exam however which is often made difficult in the inpatient setting. -Continue bladder scan q6-8h with I&O for >400cc of urine  Altered mental status: CXR findings suggestive of PNA as are UA findings for a UTI. Urine culture with polymicrobial growth. Received Levaquin x 2 though will be dosed q48h  per Pharmacy given her creatinine clearance. She does appear more sedated than on admission which is likely from her Risperdal. -Abx Dose 3: Continue Levaquin 750mg  IV q48h -Hold AM dose of Risperdal and reassess -Continue Wellbutrin per Psych recs -Continue sitter for fall risk at night -Update HCPOA  Chronic a-fib: Last seen by Dr. Rockey Situ [Cardiology] on 01/19/14 at which time anticoagulation was deferred given her fall risk. Echo with EF 55-60% with mild LVH, L atrial enlargement, pulmonary HTN in September 2011.  -Continue metoprolol 50mg  BID  DM2: A1c 7.7, April 2011. CBGs trending mostly 100s. -Check CBGs TID WC & SSI-S   Hypothyroidism: Continue Synthroid 48mcg.  COPD: Continue Spiriva   GERD: Continue Pepcid   Constipation: Continue Senokot-S.  Depression: Continue Zoloft.  #FEN:  -Diet: Heart Healthy  #DVT prophylaxis: Lovenox  #CODE STATUS: DNR/DNI -Defer to [facility] [phone] for decisions -Confirmed on admission that patient would want to be kept comfortable should she decline further  Dispo: Disposition is deferred  at this time, awaiting improvement of current medical problems.     The patient does have a current PCP [place] Pink, MD) and does not need an [facility] hospital follow-up appointment after discharge.  The patient does not have transportation limitations that hinder transportation to clinic appointments.  .Services Needed at time of discharge: Y = Yes, Blank = No PT:   OT:   RN:   Equipment:   Other:     LOS: 4 days   Charlott Rakes, MD 03/24/2014, 9:49 AM

## 2014-03-24 NOTE — Progress Notes (Signed)
Bladder scan showed 249cc urine in bladder. Will continue to monitor.

## 2014-03-24 NOTE — Progress Notes (Signed)
RN notified by NT of swelling in pt's 4th finger (ring) on L hand. RN notified MD on call. MD will be on unit to look at pt's finger. Will continue to monitor.

## 2014-03-24 NOTE — Progress Notes (Signed)
Internal Medicine Attending  Date: 03/24/2014  Patient name: [name] Medical record number: [id number] Date of birth: 1925/02/02 Age: 81 y.o. Gender: female  I saw and evaluated the patient. I discussed patient and reviewed the resident's note by Dr. Posey Pronto, and I agree with the resident's findings and plans as documented in his note.

## 2014-03-25 ENCOUNTER — Inpatient Hospital Stay (HOSPITAL_COMMUNITY): Payer: Medicare PPO

## 2014-03-25 DIAGNOSIS — R339 Retention of urine, unspecified: Secondary | ICD-10-CM

## 2014-03-25 DIAGNOSIS — M25431 Effusion, right wrist: Secondary | ICD-10-CM

## 2014-03-25 DIAGNOSIS — M7989 Other specified soft tissue disorders: Secondary | ICD-10-CM

## 2014-03-25 LAB — CBC WITH DIFFERENTIAL/PLATELET
Basophils Absolute: 0 10*3/uL (ref 0.0–0.1)
Basophils Relative: 0 % (ref 0–1)
EOS PCT: 1 % (ref 0–5)
Eosinophils Absolute: 0.1 10*3/uL (ref 0.0–0.7)
HCT: 39.7 % (ref 36.0–46.0)
HEMOGLOBIN: 13 g/dL (ref 12.0–15.0)
LYMPHS ABS: 1.9 10*3/uL (ref 0.7–4.0)
LYMPHS PCT: 21 % (ref 12–46)
MCH: 26.9 pg (ref 26.0–34.0)
MCHC: 32.7 g/dL (ref 30.0–36.0)
MCV: 82 fL (ref 78.0–100.0)
Monocytes Absolute: 1.4 10*3/uL — ABNORMAL HIGH (ref 0.1–1.0)
Monocytes Relative: 15 % — ABNORMAL HIGH (ref 3–12)
NEUTROS ABS: 5.5 10*3/uL (ref 1.7–7.7)
Neutrophils Relative %: 63 % (ref 43–77)
PLATELETS: 236 10*3/uL (ref 150–400)
RBC: 4.84 MIL/uL (ref 3.87–5.11)
RDW: 18.5 % — ABNORMAL HIGH (ref 11.5–15.5)
WBC: 8.9 10*3/uL (ref 4.0–10.5)

## 2014-03-25 LAB — GLUCOSE, CAPILLARY
GLUCOSE-CAPILLARY: 131 mg/dL — AB (ref 70–99)
GLUCOSE-CAPILLARY: 187 mg/dL — AB (ref 70–99)
GLUCOSE-CAPILLARY: 96 mg/dL (ref 70–99)
GLUCOSE-CAPILLARY: 180 mg/dL — AB (ref 70–99)

## 2014-03-25 LAB — URIC ACID: Uric Acid, Serum: 4 mg/dL (ref 2.4–7.0)

## 2014-03-25 MED ORDER — IBUPROFEN 600 MG PO TABS
600.0000 mg | ORAL_TABLET | Freq: Four times a day (QID) | ORAL | Status: DC | PRN
  Administered 2014-03-25: 600 mg via ORAL
  Filled 2014-03-25 (×2): qty 1

## 2014-03-25 MED ORDER — LEVOFLOXACIN 750 MG PO TABS
750.0000 mg | ORAL_TABLET | ORAL | Status: AC
  Administered 2014-03-25: 750 mg via ORAL
  Filled 2014-03-25: qty 1

## 2014-03-25 MED ORDER — RISPERIDONE 0.25 MG PO TABS
0.2500 mg | ORAL_TABLET | Freq: Every day | ORAL | Status: DC
  Filled 2014-03-25: qty 1

## 2014-03-25 NOTE — Progress Notes (Signed)
Subjective: Overnight, she had an episode of incontinence but has so far had 129cc, 249cc of urine on bladder scan. This AM, she was arousable on exam though preferred to stay asleep. She answers my questions with mainly yes/no and did open her eyes somewhat.   Objective: Vital signs in last 24 hours: Filed Vitals:   03/24/14 0555 03/24/14 1540 03/24/14 2133 03/25/14 0515  BP: 148/79 135/92 161/98 153/81  Pulse: 77 75 102 73  Temp: 98.4 F (36.9 C) 98.6 F (37 C) 97.8 F (36.6 C) 98 F (36.7 C)  TempSrc:      Resp: 15 16 18 18   Weight:      SpO2: 97% 96% 100% 96%   Weight change:   Intake/Output Summary (Last 24 hours) at 03/25/14 1205 Last data filed at 03/25/14 1010  Gross per 24 hour  Intake    220 ml  Output      0 ml  Net    220 ml   General: resting in bed, NAD HEENT: PERRL, EOMI, no scleral icterus Cardiac: RRR, no rubs, murmurs or gallops Pulm: clear to auscultation bilaterally, no wheezes, rales, or rhonchi in the anterior lung fields Abd: soft, nontender, nondistended, BS present Ext: warm and well perfused, DP pulses present bilaterally, L 4th PIP with redness and swelling and tenderness on palpation, R wrist tender to touch with IV, no pedal edema Neuro: responds to questions with yes/no; unable to assess orientation this AM; moving all extremities freely  Lab Results: Basic Metabolic Panel:  Recent Labs Lab 03/22/14 0950 03/24/14 0434  NA 136 137  K 3.7 3.5  CL 103 104  CO2 20 23  GLUCOSE 100* 119*  BUN 9 11  CREATININE 0.65 0.65  CALCIUM 9.0 8.9   Liver Function Tests:  Recent Labs Lab 03/20/14 1251  AST 33  ALT 18  ALKPHOS 116  BILITOT 1.3*  PROT 6.4  ALBUMIN 3.5   CBC:  Recent Labs Lab 03/20/14 1251 03/25/14 0425  WBC 7.2 8.9  NEUTROABS 3.9 5.5  HGB 13.8 13.0  HCT 41.8 39.7  MCV 83.3 82.0  PLT 250 236   Cardiac Enzymes:  Recent Labs Lab 03/20/14 2051  TROPONINI <0.03   CBG:  Recent Labs Lab 03/24/14 0846  03/24/14 1204 03/24/14 1721 03/24/14 2124 03/25/14 0748 03/25/14 1134  GLUCAP 130* 109* 146* 141* 131* 187*   Thyroid Function Tests:  Recent Labs Lab 03/20/14 2051  TSH 3.291   Urinalysis:  Recent Labs Lab 03/20/14 1300  COLORURINE AMBER*  LABSPEC 1.025  PHURINE 7.0  GLUCOSEU NEGATIVE  HGBUR NEGATIVE  BILIRUBINUR NEGATIVE  KETONESUR 15*  PROTEINUR 100*  UROBILINOGEN 1.0  NITRITE NEGATIVE  LEUKOCYTESUR TRACE*     Micro Results: Recent Results (from the past 240 hour(s))  Urine culture     Status: None   Collection Time: 03/20/14  1:00 PM  Result Value Ref Range Status   Specimen Description URINE, RANDOM  Final   Special Requests NONE  Final   Colony Count   Final    70,000 COLONIES/ML Performed at [facility]    Culture   Final    Multiple bacterial morphotypes present, none predominant. Suggest appropriate recollection if clinically indicated. Performed at [facility]    Report Status 03/22/2014 FINAL  Final  MRSA PCR Screening     Status: None   Collection Time: 03/20/14  8:37 PM  Result Value Ref Range Status   MRSA by PCR NEGATIVE NEGATIVE Final  Comment:        The GeneXpert MRSA Assay (FDA approved for NASAL specimens only), is one component of a comprehensive MRSA colonization surveillance program. It is not intended to diagnose MRSA infection nor to guide or monitor treatment for MRSA infections.    Studies/Results: Dg Wrist 2 Views Right  03/25/2014   CLINICAL DATA:  Swelling of wrist.  EXAM: RIGHT WRIST - 2 VIEW  COMPARISON:  None.  FINDINGS: The bones appear osteopenic. No acute fracture or subluxation identified. Diffuse soft tissue swelling noted. Soft tissue calcification identified adjacent to the ulnar styloid.  IMPRESSION: 1. No acute bone abnormality. 2. Soft tissue swelling.   Electronically Signed   By: Kerby Moors M.D.   On: 03/25/2014 08:06   Dg Finger Ring Left  03/25/2014   CLINICAL DATA:  Increased  swelling of ring finger.  EXAM: LEFT RING FINGER 2+V  COMPARISON:  None.  FINDINGS: The staff was unable to remove the patient's rings limiting exam. There is diffuse soft tissue swelling. The bones appear osteopenic. Moderate degenerative changes involve the PIP and DIP joints of the fourth and fifth digits. No fractures or subluxations identified.  IMPRESSION: 1. Soft tissue swelling. 2. Osteoarthritis and osteopenia.   Electronically Signed   By: Kerby Moors M.D.   On: 03/25/2014 08:03   Medications: I have reviewed the patient's current medications. Scheduled Meds: . buPROPion  100 mg Oral Daily  . enoxaparin (LOVENOX) injection  30 mg Subcutaneous Q24H  . famotidine  20 mg Oral BID  . feeding supplement (ENSURE COMPLETE)  237 mL Oral BID BM  . insulin aspart  0-9 Units Subcutaneous TID WC  . levothyroxine  75 mcg Oral Daily  . metoprolol  50 mg Oral BID  . risperiDONE  0.25 mg Oral QHS  . senna-docusate  2 tablet Oral QHS  . tiotropium  18 mcg Inhalation Daily   Continuous Infusions:   PRN Meds:. Assessment/Plan: Acute urinary retention: Appears to be improving given decreasing post-void residuals and episode of incontinence overnight likely from oversedating effects of Risperdal. Per nursing home and HCPOA, she has been able to void independently despite her progressive dementia, and she will need to be mobilized out of bed. Possible etiologies still includes medication or anatomical. She has been on SSRI for a number of years and was only recently transitioned to Wellbutrin and is less contributory. Anatomical causes cannot be ruled out without a pelvic exam however which is often made difficult in the inpatient setting. -Continue bladder scan q6-8h with I&O for >400cc of urine -Order PT consult to mobilize out of bed -Stop IV fluids for now  Altered mental status: CXR findings suggestive of PNA as are UA findings for a UTI, and she will receive her last dose of Levaquin today for  total 7-day course of antibiotics given q48h dosing.She does appear more sedated than on admission which is likely from her Risperdal. -Abx Day 6/7: Continue Levaquin 750mg  IV q48h -Hold PM dose of Risperdal and reassess with Psychiatry tomorrow for considering this medication as PRN dosing -Continue Wellbutrin per Psych recs -Continue sitter for fall risk at night -Update HCPOA  Finger/wrist swelling: R wrist swelling/pain likely 2/2 occluded IV. L finger possibly 2/2 mispositioned while sleeping. XR unremarkable for acute process. Only new medication changes are Risperdal & Wellbutrin - both of which are not typically associated with these findings. -Reassess tomorrow  Chronic a-fib: Last seen by Dr. Rockey Situ [Cardiology] on 01/19/14 at which time anticoagulation was  deferred given her fall risk. Echo with EF 55-60% with mild LVH, L atrial enlargement, pulmonary HTN in September 2011.  -Continue metoprolol 50mg  BID  DM2: A1c 7.7, April 2011. CBGs trending mostly 100s. -Check CBGs TID WC & SSI-S   Hypothyroidism: Continue Synthroid 70mcg.  COPD: Continue Spiriva   GERD: Continue Pepcid   Constipation: Continue Senokot-S.  Depression: Continue Zoloft.  #FEN:  -Diet: Heart Healthy  #DVT prophylaxis: Lovenox  #CODE STATUS: DNR/DNI -Defer to [facility] [phone] for decisions -Confirmed on admission that patient would want to be kept comfortable should she decline further  Dispo: Disposition is deferred at this time, awaiting improvement of current medical problems.     The patient does have a current PCP [place] Pink, MD) and does not need an [facility] hospital follow-up appointment after discharge.  The patient does not have transportation limitations that hinder transportation to clinic appointments.  .Services Needed at time of discharge: Y = Yes, Blank = No PT:   OT:   RN:   Equipment:   Other:     LOS: 5 days   Charlott Rakes, MD 03/25/2014,  12:05 PM`

## 2014-03-25 NOTE — Progress Notes (Signed)
Internal Medicine Attending  Date: 03/25/2014  Patient name: [name] Medical record number: [id number] Date of birth: March 11, 1924 Age: 81 y.o. Gender: female  I saw and evaluated the patient. I discussed patient and reviewed the resident's note by Dr. Posey Pronto, and I agree with the resident's findings and plans as documented in his note.

## 2014-03-25 NOTE — Progress Notes (Signed)
Noticed pt is grimacing when her BUE is touched. Pt unable to tell when she is in pain. BUE appeared swollen and red, applied ice pack to R arm. MD made aware of pt condition. Ordered advil and carried out. We will continue to monitor.

## 2014-03-26 ENCOUNTER — Encounter (HOSPITAL_COMMUNITY): Payer: Self-pay

## 2014-03-26 ENCOUNTER — Inpatient Hospital Stay (HOSPITAL_COMMUNITY): Payer: Medicare PPO

## 2014-03-26 LAB — GLUCOSE, CAPILLARY
GLUCOSE-CAPILLARY: 105 mg/dL — AB (ref 70–99)
GLUCOSE-CAPILLARY: 123 mg/dL — AB (ref 70–99)
Glucose-Capillary: 101 mg/dL — ABNORMAL HIGH (ref 70–99)
Glucose-Capillary: 108 mg/dL — ABNORMAL HIGH (ref 70–99)

## 2014-03-26 NOTE — Consult Note (Signed)
Psychiatry Consult follow-up note  Reason for Consult:  Dementia with AMS and medication managment Referring Physician:  Dr. Posey Pronto  Patient Identification: [name] MRN:  [id number] Principal Diagnosis: Senile dementia, uncomplicated Diagnosis:   Patient Active Problem List   Diagnosis Date Noted  . Urinary retention [R33.9]   . Altered mental status [R41.82]   . HCAP (healthcare-associated pneumonia) [J18.9] 03/20/2014  . Weakness [R53.1] 03/20/2014  . Intertrochanteric fracture of right hip [S72.141A] 10/30/2013  . Fracture of right humerus [S42.301A] 10/30/2013  . Essential hypertension, benign [I10] 10/30/2013  . CHF, chronic [I50.9] 10/30/2013  . Type II or unspecified type diabetes mellitus with unspecified complication, uncontrolled [E11.8] 10/30/2013  . Localized swelling of both lower legs [R22.43] 09/11/2013  . Atrial fibrillation [I48.91] 08/21/2013  . Iron deficiency anemia, unspecified [D50.9] 07/15/2012  . Falls [id number]] 04/07/2011  . Senile dementia, uncomplicated [T46.56] 81/27/5170  . OSTEOARTHRITIS [M19.90] 10/17/2006  . HYPOTHYROIDISM [E03.9] 10/13/2006  . ANXIETY [F41.1] 10/13/2006  . DEPRESSION [F32.9] 10/13/2006  . COPD with chronic bronchitis [J44.9] 10/13/2006  . HIATAL HERNIA WITH REFLUX [K44.9] 10/13/2006  . DIVERTICULOSIS, COLON [K57.30] 10/13/2006  . SLEEP DISORDER [G47.9] 07/20/2006    Total Time spent with patient: 30 minutes  Subjective:   [name] is a 81 y.o. female patient admitted with dementia with AMS.  HPI:  [name] is a 81 yo female seen for psychiatric consultation and evaluation of dementia with the decreased psychomotor activities, altered mental status and deterioration of cognition since he was placed in a skilled nursing facility that was 02/20/2014. Information for this evaluation of pain from the available medical records and face-to-face evaluation with patient. Reportedly her baseline is able to care for  herself and feed herself which was deteriorated over the last few weeks. Patient was treated with Depakote for agitation and other behavioral problems which made her more slowed down. Patient is a poor historian she is oriented to only her name. Patient has been a poor communicator and difficult to understand because of garbled language. No family members are available at at bedside. Patient has no previous acute psychiatric hospitalization as per the electronic health records.  Interval history: Patient continued to have decreased psychomotor activity, altered mental status and less responsive per the verbal stimuli. Patient medication risperidone was decreased and has no major changes in her responses. Patient will be getting CT scan and chest x-ray to rule out any medical problems at this time. Patient will be followed up by psychiatric consultation services as needed clinically. Meanwhile ADD with the discontinuation of risperidone and continuation of Wellbutrin SR 100 mg. Recommended minimize sedating medication.   Past Medical History:  Past Medical History  Diagnosis Date  . Hypertension   . Diabetes mellitus   . Hypothyroidism   . Vertigo   . Anemia   . Osteoarthritis   . GERD (gastroesophageal reflux disease)   . Diverticulosis   . Depression   . H/O: hysterectomy     at age 11  . COPD (chronic obstructive pulmonary disease)   . History of DVT (deep vein thrombosis)   . Anxiety   . Dyspnea   . Enlarged heart   . Fibromyalgia   . Degenerative joint disease   . Degenerative disk disease   . Tremor   . Falls   . Iron deficiency anemia, unspecified 07/15/2012  . CHF (congestive heart failure)   . Cancer     basal cell  . Atrial fibrillation 08/21/2013  . HIATAL  HERNIA WITH REFLUX 10/13/2006    Qualifier: Diagnosis of  By: Silvio Pate MD, Baird Cancer   . COPD with chronic bronchitis 10/13/2006    Never a primary smoker, but secondhand smoke from husband by history. Emphysema and  chronic bronchitis, mild.    . Senile dementia, uncomplicated 4/74/2595    Qualifier: Diagnosis of  By: Copland MD, Frederico Hamman    . VERTIGO 10/02/2008    Qualifier: Diagnosis of  By: Lorelei Pont MD, Frederico Hamman    . TREMOR, ESSENTIAL 10/13/2006    Qualifier: Diagnosis of  By: Silvio Pate MD, Baird Cancer   . POSITIVE PPD 06/02/2007    Qualifier: Diagnosis of  By: Julien Girt CMA, Marliss Czar    . COLONIC POLYPS, HX OF 10/13/2006    Qualifier: Diagnosis of  By: Silvio Pate MD, Baird Cancer     Past Surgical History  Procedure Laterality Date  . Cholecystectomy  1944  . Appendectomy  1952  . Ovarian cyst removal  1982    secondary ventral hematoma  . Bladder tack  1997  . Cataract extraction, bilateral    . Kyphosis surgery    . Total abdominal hysterectomy    . Salpingoophorectomy      unilateral salpingo-oophorectomy  . Orif right hip     Family History:  Family History  Problem Relation Age of Onset  . Stroke Mother   . Cancer Father     Pancreatic Cancer  . Kidney disease Sister   . Heart disease Brother   . Stroke Brother   . Heart disease Brother    Social History:  History  Alcohol Use No     History  Drug Use No    History   Social History  . Marital Status: Widowed    Spouse Name: N/A    Number of Children: N/A  . Years of Education: N/A   Social History Main Topics  . Smoking status: Never Smoker   . Smokeless tobacco: Never Used  . Alcohol Use: No  . Drug Use: No  . Sexual Activity: No   Other Topics Concern  . None   Social History Narrative   Additional Social History:                          Allergies:   Allergies  Allergen Reactions  . Aricept [Donepezil Hydrochloride]     REACTION: delusions  . Depakote [Divalproex [place] stated they suspect Pt is having an allergic reaction to med  . Hydrocodone-Acetaminophen     REACTION: U/K  . Metoprolol     REACTION: dizzy  . Nitrofurantoin   . Sulfonamide Derivatives     REACTION: unspecified     Vitals: Blood pressure 124/84, pulse 79, temperature 97.6 F (36.4 C), temperature source Oral, resp. rate 18, height 5' (1.524 m), weight 42.5 kg (93 lb 11.1 oz), SpO2 95 %.  Risk to Self: Is patient at risk for suicide?: No Risk to Others:   Prior Inpatient Therapy:   Prior Outpatient Therapy:    Current Facility-Administered Medications  Medication Dose Route Frequency Provider Last Rate Last Dose  . buPROPion [facility] SR) 12 hr tablet 100 mg  100 mg Oral Daily Durward Parcel, MD   Stopped at 03/26/14 1116  . enoxaparin (LOVENOX) injection 30 mg  30 mg Subcutaneous Q24H Ejiroghene E Emokpae, MD   30 mg at 03/25/14 2231  . famotidine (PEPCID) tablet 20 mg  20 mg Oral BID Ejiroghene  Arlyce Dice, MD   Stopped at 03/26/14 1115  . feeding supplement (ENSURE COMPLETE) (ENSURE COMPLETE) liquid 237 mL  237 mL Oral BID BM Charlott Rakes, MD   237 mL at 03/25/14 0919  . ibuprofen (ADVIL,MOTRIN) tablet 600 mg  600 mg Oral Q6H PRN Kelby Aline, MD   600 mg at 03/25/14 2344  . insulin aspart (novoLOG) injection 0-9 Units  0-9 Units Subcutaneous TID WC Kelby Aline, MD   2 Units at 03/25/14 1205  . levothyroxine (SYNTHROID, LEVOTHROID) tablet 75 mcg  75 mcg Oral Daily Bethena Roys, MD   75 mcg at 03/26/14 0801  . metoprolol (LOPRESSOR) tablet 50 mg  50 mg Oral BID Bethena Roys, MD   Stopped at 03/26/14 1116  . senna-docusate (Senokot-S) tablet 2 tablet  2 tablet Oral QHS Bethena Roys, MD   2 tablet at 03/25/14 2231  . tiotropium (SPIRIVA) inhalation capsule 18 mcg  18 mcg Inhalation Daily Bethena Roys, MD   18 mcg at 03/21/14 0744    Musculoskeletal: Strength & Muscle Tone: decreased Gait & Station: unable to stand Patient leans: N/A  Psychiatric Specialty Exam: Physical Exam as per the history and physical   ROS generalized weakness, cognitive deterioration and poor historian   Blood pressure 124/84, pulse 79, temperature 97.6 F (36.4 C),  temperature source Oral, resp. rate 18, height 5' (1.524 m), weight 42.5 kg (93 lb 11.1 oz), SpO2 95 %.Body mass index is 18.3 kg/(m^2).  General Appearance: Guarded  Eye Contact::  Fair  Speech:  Garbled and Slow  Volume:  Decreased  Mood:  Anxious and Depressed  Affect:  Depressed  Thought Process:  Irrelevant and Loose  Orientation:  Other:  Oriented to her name only  Thought Content:  Rumination  Suicidal Thoughts:  No  Homicidal Thoughts:  No  Memory:  Immediate;   Poor Recent;   Poor  Judgement:  Impaired  Insight:  Shallow  Psychomotor Activity:  Decreased  Concentration:  Poor  Recall:  Poor  Fund of Knowledge:Poor  Language: Fair  Akathisia:  NA  Handed:  Right  AIMS (if indicated):     Assets:  Catering manager Housing Leisure Time Social Support  ADL's:  Impaired  Cognition: Impaired,  Severe  Sleep:      Medical Decision Making: Self-Limited or Minor (1), New problem, with additional work up planned, Review of Psycho-Social Stressors (1), Review or order clinical lab tests (1), Discuss test with performing physician (1), Established Problem, Worsening (2), Review or order medicine tests (1), Independent Review of image, tracing or specimen (2), Review of Medication Regimen & Side Effects (2) and Review of New Medication or Change in Dosage (2)  Treatment Plan Summary: Daily contact with patient to assess and evaluate symptoms and progress in treatment and Medication management  Plan:  Continue Wellbutrin SR 100 mg daily  Discontinue risperidone 0.5 mg due to increased sedation Supportive therapy provided about ongoing stressors.   Disposition: Patient may return to skilled nursing facility when medically stable  Erving Sassano,JANARDHAHA R. 03/26/2014 1:32 PM

## 2014-03-26 NOTE — Progress Notes (Signed)
Subjective: Overnight, bladder scan with readings for 175 & 212 with documented incontinence as well. She also required ibuprofen overnight for R arm tenderness. This AM, she was mildly arousable at bedside.   Objective: Vital signs in last 24 hours: Filed Vitals:   03/25/14 1402 03/25/14 2212 03/26/14 0623 03/26/14 1100  BP: 132/83 149/86 124/84   Pulse: 74 79    Temp: 98.1 F (36.7 C) 99.4 F (37.4 C) 97.6 F (36.4 C)   TempSrc: Oral Oral Oral   Resp: 24 18 18    Height:    5' (1.524 m)  Weight:      SpO2: 98% 96% 95%    Weight change:   Intake/Output Summary (Last 24 hours) at 03/26/14 1444 Last data filed at 03/26/14 1438  Gross per 24 hour  Intake    100 ml  Output      0 ml  Net    100 ml   General: resting in bed, mildly arousable, NAD HEENT: pupil exam deferred, no scleral icterus Cardiac: RRR, no rubs, murmurs or gallops Pulm: clear to auscultation bilaterally, no wheezes, rales, or rhonchi in the anterior lung fields Abd: soft, nontender, nondistended, BS present Ext: warm and well perfused, DP pulses present bilaterally, L 4th PIP with improved redness and swelling and no grimace on palpation, R arm swollen and not tender to touch though grimace elicited with passive movement, no pedal edema Neuro: unable to assess orientation this AM; moving all extremities freely albeit slowly  Lab Results: Basic Metabolic Panel:  Recent Labs Lab 03/22/14 0950 03/24/14 0434  NA 136 137  K 3.7 3.5  CL 103 104  CO2 20 23  GLUCOSE 100* 119*  BUN 9 11  CREATININE 0.65 0.65  CALCIUM 9.0 8.9   Liver Function Tests:  Recent Labs Lab 03/20/14 1251  AST 33  ALT 18  ALKPHOS 116  BILITOT 1.3*  PROT 6.4  ALBUMIN 3.5   CBC:  Recent Labs Lab 03/20/14 1251 03/25/14 0425  WBC 7.2 8.9  NEUTROABS 3.9 5.5  HGB 13.8 13.0  HCT 41.8 39.7  MCV 83.3 82.0  PLT 250 236   Cardiac Enzymes:  Recent Labs Lab 03/20/14 2051  TROPONINI <0.03   CBG:  Recent  Labs Lab 03/25/14 0748 03/25/14 1134 03/25/14 1641 03/25/14 2208 03/26/14 0739 03/26/14 1152  GLUCAP 131* 187* 96 180* 101* 105*   Thyroid Function Tests:  Recent Labs Lab 03/20/14 2051  TSH 3.291   Urinalysis:  Recent Labs Lab 03/20/14 1300  COLORURINE AMBER*  LABSPEC 1.025  PHURINE 7.0  GLUCOSEU NEGATIVE  HGBUR NEGATIVE  BILIRUBINUR NEGATIVE  KETONESUR 15*  PROTEINUR 100*  UROBILINOGEN 1.0  NITRITE NEGATIVE  LEUKOCYTESUR TRACE*     Micro Results: Recent Results (from the past 240 hour(s))  Urine culture     Status: None   Collection Time: 03/20/14  1:00 PM  Result Value Ref Range Status   Specimen Description URINE, RANDOM  Final   Special Requests NONE  Final   Colony Count   Final    70,000 COLONIES/ML Performed at [facility]    Culture   Final    Multiple bacterial morphotypes present, none predominant. Suggest appropriate recollection if clinically indicated. Performed at [facility]    Report Status 03/22/2014 FINAL  Final  MRSA PCR Screening     Status: None   Collection Time: 03/20/14  8:37 PM  Result Value Ref Range Status   MRSA by PCR NEGATIVE NEGATIVE Final  Comment:        The GeneXpert MRSA Assay (FDA approved for NASAL specimens only), is one component of a comprehensive MRSA colonization surveillance program. It is not intended to diagnose MRSA infection nor to guide or monitor treatment for MRSA infections.    Studies/Results: Dg Wrist 2 Views Right  03/25/2014   CLINICAL DATA:  Swelling of wrist.  EXAM: RIGHT WRIST - 2 VIEW  COMPARISON:  None.  FINDINGS: The bones appear osteopenic. No acute fracture or subluxation identified. Diffuse soft tissue swelling noted. Soft tissue calcification identified adjacent to the ulnar styloid.  IMPRESSION: 1. No acute bone abnormality. 2. Soft tissue swelling.   Electronically Signed   By: Kerby Moors M.D.   On: 03/25/2014 08:06   Ct Head Wo Contrast  03/26/2014    CLINICAL DATA:  Altered mental status.  Confusion.  EXAM: CT HEAD WITHOUT CONTRAST  TECHNIQUE: Contiguous axial images were obtained from the base of the skull through the vertex without intravenous contrast.  COMPARISON:  03/20/2014  FINDINGS: There is no intracranial hemorrhage, mass or evidence of acute infarction. There is moderate generalized atrophy. There is chronic microvascular ischemic disease. No acute findings are evident. No interval change is evident.  IMPRESSION: Chronic ischemic appearing changes.  No acute findings.   Electronically Signed   By: Andreas Newport M.D.   On: 03/26/2014 13:56   Dg Chest Port 1 View  03/26/2014   CLINICAL DATA:  Pneumonia.  EXAM: PORTABLE CHEST - 1 VIEW  COMPARISON:  03/21/2014.  FINDINGS: Mediastinum and hilar structures normal. Cardiomegaly with normal pulmonary vascularity. Mild pulmonary interstitial prominence noted suggesting mild CHF and/or pneumonitis. Small right pleural effusion cannot be excluded. No pneumothorax.  IMPRESSION: 1. Cardiomegaly. 2. Mild pulmonary interstitial prominence consistent with mild interstitial edema and/or pneumonitis. Small right pleural effusion cannot be excluded .   Electronically Signed   By: Marcello Moores  Register   On: 03/26/2014 11:21   Dg Finger Ring Left  03/25/2014   CLINICAL DATA:  Increased swelling of ring finger.  EXAM: LEFT RING FINGER 2+V  COMPARISON:  None.  FINDINGS: The staff was unable to remove the patient's rings limiting exam. There is diffuse soft tissue swelling. The bones appear osteopenic. Moderate degenerative changes involve the PIP and DIP joints of the fourth and fifth digits. No fractures or subluxations identified.  IMPRESSION: 1. Soft tissue swelling. 2. Osteoarthritis and osteopenia.   Electronically Signed   By: Kerby Moors M.D.   On: 03/25/2014 08:03   Medications: I have reviewed the patient's current medications. Scheduled Meds: . buPROPion  100 mg Oral Daily  . enoxaparin (LOVENOX)  injection  30 mg Subcutaneous Q24H  . famotidine  20 mg Oral BID  . feeding supplement (ENSURE COMPLETE)  237 mL Oral BID BM  . insulin aspart  0-9 Units Subcutaneous TID WC  . levothyroxine  75 mcg Oral Daily  . metoprolol  50 mg Oral BID  . senna-docusate  2 tablet Oral QHS  . tiotropium  18 mcg Inhalation Daily   Continuous Infusions:   PRN Meds:. Assessment/Plan: Altered mental status: CXR findings suggestive of PNA which appear mildly improved today on repeat imaging. Received her last dose of Levaquin yesterday for total 7-day course of antibiotics given q48h dosing. She does appear more sedated than on admission which is likely from her Risperdal though Levaquin may have been contributory per Dr. Louretta Shorten [Psychiatry]. -Check head CT to r/o acute process -Continue Wellbutrin per Psych recs -Discontinue Risperdal  for now -Landscape architect for fall risk at night -Update HCPOA  Acute urinary retention: Appears to be improving given decreasing post-void residuals and episode of incontinence overnight likely from side effect of Risperdal. Per nursing home and HCPOA, she has been able to void independently despite her progressive dementia, and she will need to be mobilized out of bed. Anatomical causes cannot be ruled out without a pelvic exam however which is often made difficult in the inpatient setting. -Continue bladder scan q6-8h with I&O for >400cc of urine  Finger/wrist swelling: R wrist swelling/pain likely 2/2 occluded IV and should resolve. Infectious causes less likely given that she just completed abx therapy unless she was infected with an opportunistic infection though less suggestive given that arm was not tender or warm to touch on exam - only with movement. L finger swelling looks improved today and likely 2/2 mispositioning.  -Continue reassesing  Chronic a-fib: Last seen by Dr. Rockey Situ [Cardiology] on 01/19/14 at which time anticoagulation was deferred given her fall  risk. Echo with EF 55-60% with mild LVH, L atrial enlargement, pulmonary HTN in September 2011.  -Continue metoprolol 50mg  BID  DM2: A1c 7.7, April 2011. CBGs trending mostly 100s. -Check CBGs TID WC & SSI-S   Hypothyroidism: Continue Synthroid 82mcg.  COPD: Continue Spiriva   GERD: Continue Pepcid   Constipation: Continue Senokot-S.  Depression: Continue Zoloft.  #FEN:  -Diet: Heart Healthy  #DVT prophylaxis: Lovenox  #CODE STATUS: DNR/DNI -Defer to [place] [phone] for decisions -Confirmed on admission that patient would want to be kept comfortable should she decline further  Dispo: Disposition is deferred at this time, awaiting improvement of current medical problems.     The patient does have a current PCP [place] Pink, MD) and does not need an [facility] hospital follow-up appointment after discharge.  The patient does not have transportation limitations that hinder transportation to clinic appointments.  .Services Needed at time of discharge: Y = Yes, Blank = No PT:   OT:   RN:   Equipment:   Other:     LOS: 6 days   Charlott Rakes, MD 03/26/2014, 2:44 PM`

## 2014-03-26 NOTE — Evaluation (Addendum)
Physical Therapy Evaluation Patient Details Name: [name] MRN: [id number] DOB: 07-14-1924 Today's Date: 03/26/2014   History of Present Illness  81 yo female with a PMHx of Atrial Fibrillation, CHF, dementia, hypothyroidism, anemia, GERD, depression, COPD, degenerative joint disease with several falls and fractures who presents today from a nursing home with weakness (as reported by nursing home). She is having difficulty forming words and is confused  Clinical Impression  Patient demonstrates deficits in functional mobility as indicated below. Will need continued skilled PT to address deficits and maximize function. Will see as indicated and progress as tolerated. Unable to fully assess OOB mobility at this time secondary to lethargy and AMS. Will follow progress and update accordingly.    Follow Up Recommendations SNF;Supervision/Assistance - 24 hour    Equipment Recommendations  Other (comment) (tbd)    Recommendations for Other Services       Precautions / Restrictions Precautions Precautions: Fall Restrictions Weight Bearing Restrictions: No Other Position/Activity Restrictions: frail with hx of multiple fractures related to falls      Mobility  Bed Mobility Overal bed mobility: Needs Assistance Bed Mobility: Supine to Sit;Sit to Sidelying;Rolling Rolling: Max assist   Supine to sit: Max assist   Sit to sidelying: Mod assist General bed mobility comments: patient required max to total assist to come to sitting EOB, but was able to initate Right sidelying to return to supine, difficulty moveing LEs in bed and could not reposition without physical assist  Transfers                 General transfer comment: did not attempt  Ambulation/Gait             General Gait Details: did not attempt  Stairs            Wheelchair Mobility    Modified Rankin (Stroke Patients Only)       Balance Overall balance assessment: Needs assistance;History  of Falls Sitting-balance support: Feet supported;Single extremity supported Sitting balance-Leahy Scale: Poor Sitting balance - Comments: patient with continuous right lateral lean, propping herself on her right elbow, was able to breifly maintain upright position but could not maintain it consistantly with out tactile cues for repositioning. Postural control: Right lateral lean                                   Pertinent Vitals/Pain Pain Assessment: Faces Faces Pain Scale: Hurts a little bit Pain Location: unknown Pain Descriptors / Indicators: Grimacing Pain Intervention(s): Repositioned;Limited activity within patient's tolerance;Monitored during session    Home Living Family/patient expects to be discharged to:: Assisted living                      Prior Function           Comments: per chart history : Normally, she would feed herself, take herself to the bathroom, carry on conversations      Hand Dominance        Extremity/Trunk Assessment               Lower Extremity Assessment: Generalized weakness;Difficult to assess due to impaired cognition      Cervical / Trunk Assessment: Kyphotic  Communication      Cognition Arousal/Alertness: Lethargic Behavior During Therapy: Flat affect Overall Cognitive Status: Difficult to assess Area of Impairment: Orientation;Following commands Orientation Level: Disoriented to;Place;Time;Situation     Following Commands: Follows  one step commands consistently (able to follow commands to winggle fingers and toes)       General Comments: patient was able to follow simple one step commands while sitting EOB such as wiggle toes and wiggle fingers, but demonstrates difficulty carrying out higher level tasks such as holding leg out against resistance    General Comments General comments (skin integrity, edema, etc.): patient with various bruising over extremities, mainly UEs.    Exercises         Assessment/Plan    PT Assessment Patient needs continued PT services  PT Diagnosis Difficulty walking;Generalized weakness;Acute pain;Altered mental status   PT Problem List Decreased strength;Decreased range of motion;Decreased activity tolerance;Decreased balance;Decreased mobility;Decreased cognition;Pain  PT Treatment Interventions DME instruction;Gait training;Functional mobility training;Therapeutic activities;Therapeutic exercise;Balance training;Cognitive remediation;Patient/family education   PT Goals (Current goals can be found in the Care Plan section) Acute Rehab PT Goals Patient Stated Goal: none stated PT Goal Formulation: With patient Time For Goal Achievement: 04/09/14 Potential to Achieve Goals: Fair    Frequency Min 2X/week   Barriers to discharge        Co-evaluation               End of Session   Activity Tolerance: Patient limited by fatigue Patient left: in bed;with call bell/phone within reach;with bed alarm set Nurse Communication: Mobility status         Time: 8921-1941 PT Time Calculation (min) (ACUTE ONLY): 17 min   Charges:   PT Evaluation $Initial PT Evaluation Tier I: 1 Procedure     PT G CodesDuncan Dull April 02, 2014, 9:39 AM  Alben Deeds, PT DPT  [phone]

## 2014-03-26 NOTE — Clinical Social Work Note (Signed)
CSW continues to follow for DC needs. Patient will return to [facility] at discharge.   Liz Beach MSW, [place], [place], [phone]

## 2014-03-26 NOTE — Progress Notes (Signed)
Internal Medicine Attending  Date: 03/26/2014  Patient name: [name] Medical record number: [id number] Date of birth: August 06, 1924 Age: 81 y.o. Gender: female  I saw and evaluated the patient, and discussed her care with resident on A.M rounds.  I reviewed the resident's note by Dr. Posey Pronto and I agree with the resident's findings and plans as documented in his note.

## 2014-03-27 DIAGNOSIS — M25439 Effusion, unspecified wrist: Secondary | ICD-10-CM

## 2014-03-27 LAB — BASIC METABOLIC PANEL
Anion gap: 13 (ref 5–15)
BUN: 20 mg/dL (ref 6–23)
CHLORIDE: 104 mmol/L (ref 96–112)
CO2: 22 mmol/L (ref 19–32)
CREATININE: 0.72 mg/dL (ref 0.50–1.10)
Calcium: 8.9 mg/dL (ref 8.4–10.5)
GFR, EST AFRICAN AMERICAN: 87 mL/min — AB (ref >90)
GFR, EST NON AFRICAN AMERICAN: 75 mL/min — AB (ref >90)
Glucose, Bld: 135 mg/dL — ABNORMAL HIGH (ref 70–99)
POTASSIUM: 3.2 mmol/L — AB (ref 3.5–5.1)
SODIUM: 139 mmol/L (ref 135–145)

## 2014-03-27 LAB — GLUCOSE, CAPILLARY
Glucose-Capillary: 95 mg/dL (ref 70–99)
Glucose-Capillary: 123 mg/dL — ABNORMAL HIGH (ref 70–99)
Glucose-Capillary: 156 mg/dL — ABNORMAL HIGH (ref 70–99)
Glucose-Capillary: 116 mg/dL — ABNORMAL HIGH (ref 70–99)

## 2014-03-27 MED ORDER — SODIUM CHLORIDE 0.9 % IV SOLN: INTRAVENOUS | Status: DC

## 2014-03-27 MED ORDER — FLUOXETINE HCL 10 MG PO CAPS
10.0000 mg | ORAL_CAPSULE | Freq: Every day | ORAL | Status: DC
  Administered 2014-03-27 – 2014-03-28 (×2): 10 mg via ORAL
  Filled 2014-03-27 (×2): qty 1

## 2014-03-27 MED ORDER — RISPERIDONE 0.25 MG PO TABS
0.2500 mg | ORAL_TABLET | Freq: Every day | ORAL | Status: DC
  Administered 2014-03-27: 0.25 mg via ORAL
  Filled 2014-03-27 (×2): qty 1

## 2014-03-27 MED ORDER — SODIUM CHLORIDE 0.9 % IV SOLN
INTRAVENOUS | Status: AC
  Administered 2014-03-27: 22:00:00 via INTRAVENOUS

## 2014-03-27 MED ORDER — POTASSIUM CHLORIDE CRYS ER 20 MEQ PO TBCR
40.0000 meq | EXTENDED_RELEASE_TABLET | Freq: Once | ORAL | Status: AC
  Administered 2014-03-27: 40 meq via ORAL
  Filled 2014-03-27: qty 2

## 2014-03-27 NOTE — Progress Notes (Signed)
Pt voided putting out 400 cc. Bladderscan done post-void showed 360cc. In & out cath done with 450cc output. Will continue to monitor pt and offer bathroom assistance frequently.

## 2014-03-27 NOTE — Progress Notes (Signed)
Pt with bladderscan showing 530 cc. Paged provider on call. Pt to get I & O cath. Paged provider about pt not having a bm since 1/24. With no further orders given but to continue to monitor pt overnight.

## 2014-03-27 NOTE — Progress Notes (Addendum)
Physical Therapy Treatment Patient Details Name: [name] MRN: [id number] DOB: 05-20-1924 Today's Date: 03/27/2014    History of Present Illness 81 yo female with a PMHx of Atrial Fibrillation, CHF, dementia, hypothyroidism, anemia, GERD, depression, COPD, degenerative joint disease with several falls and fractures who presents today from a nursing home with weakness (as reported by nursing home). She is having difficulty forming words and is confused    PT Comments    Pt appeared sad/distraught through out treatment, crying the whole time up and resistant once asked her to try standing.  Pt did not follow commands while sitting on the side of bed  Follow Up Recommendations  SNF;Supervision/Assistance - 24 hour     Equipment Recommendations  Other (comment)    Recommendations for Other Services       Precautions / Restrictions Precautions Precautions: Fall Restrictions Weight Bearing Restrictions: No    Mobility  Bed Mobility Overal bed mobility: Needs Assistance Bed Mobility: Supine to Sit;Sit to Supine     Supine to sit: Max assist Sit to supine: Max assist   General bed mobility comments: guiding cues and assist to sit EOB.  Although not resistive, pt cried through all activity.  Transfers                 General transfer comment: started set up to stand and pt leaned back on bed and resisted further attempts to sit up.    Ambulation/Gait             General Gait Details: did not attempt   Stairs            Wheelchair Mobility    Modified Rankin (Stroke Patients Only)       Balance Overall balance assessment: Needs assistance Sitting-balance support: Single extremity supported;Feet supported Sitting balance-Leahy Scale: Poor Sitting balance - Comments: posterior and right lean into therapist                            Cognition Arousal/Alertness: Awake/alert Behavior During Therapy:  (tearful and crying) Overall  Cognitive Status: History of cognitive impairments - at baseline Area of Impairment: Orientation;Following commands Orientation Level: Disoriented to;Place;Time;Situation     Following Commands: Follows one step commands with increased time;Follows one step commands inconsistently       General Comments: pt did not follow the majority of commands due to appeared distraught (and crying)    Exercises      General Comments        Pertinent Vitals/Pain Pain Assessment: Faces (based on crying may have been in pain on movement) Pain Intervention(s): Monitored during session;Repositioned;Limited activity within patient's tolerance    Home Living                      Prior Function            PT Goals (current goals can now be found in the care plan section) Acute Rehab PT Goals Patient Stated Goal: none stated PT Goal Formulation: With patient Time For Goal Achievement: 04/09/14 Potential to Achieve Goals: Fair Progress towards PT goals: Not progressing toward goals - comment (not able to participate today due to crying)    Frequency  Min 2X/week    PT Plan Current plan remains appropriate    Co-evaluation             End of Session   Activity Tolerance: Other (comment) (pt mildly resistant  to and crying through out session) Patient left: in bed;with call bell/phone within reach     Time: 1221-1240 PT Time Calculation (min) (ACUTE ONLY): 19 min  Charges:  $Therapeutic Activity: 8-22 mins                    G Codes:      Torrin Crihfield, Tessie Fass 03/27/2014, 3:53 PM       03/27/2014  Donnella Sham, Linton Hall [phone]  (pager)

## 2014-03-27 NOTE — Progress Notes (Addendum)
Subjective: Overnight, bladder scan with readings for 530cc after which she voided roughly 400cc. Repeat bladder scan showed 360cc from which another 450cc was obtained by I&O cath. This AM, she was awake though was very tearful.   Objective: Vital signs in last 24 hours: Filed Vitals:   03/27/14 0806 03/27/14 1422 03/27/14 1425 03/27/14 1430  BP:  153/91    Pulse:  66 77   Temp:  98.9 F (37.2 C)    TempSrc:  Oral    Resp:  32    Height:      Weight:      SpO2: 98% 86% 80% 95%   Weight change:   Intake/Output Summary (Last 24 hours) at 03/27/14 1442 Last data filed at 03/27/14 1345  Gross per 24 hour  Intake    350 ml  Output   1500 ml  Net  -1150 ml   General: resting in bed, mildly arousable, NAD HEENT: pupil exam deferred, no scleral icterus Cardiac: RRR, no rubs, murmurs or gallops Pulm: clear to auscultation bilaterally, no wheezes, rales, or rhonchi in the anterior lung fields Abd: soft, nontender, nondistended, BS present Ext: warm and well perfused, DP pulses present bilaterally, R arm swollen and not tender to touch nor with passive movement, no pedal edema Neuro: tearful on interview  Lab Results: Basic Metabolic Panel:  Recent Labs Lab 03/22/14 0950 03/24/14 0434  NA 136 137  K 3.7 3.5  CL 103 104  CO2 20 23  GLUCOSE 100* 119*  BUN 9 11  CREATININE 0.65 0.65  CALCIUM 9.0 8.9   CBC:  Recent Labs Lab 03/25/14 0425  WBC 8.9  NEUTROABS 5.5  HGB 13.0  HCT 39.7  MCV 82.0  PLT 236   Cardiac Enzymes:  Recent Labs Lab 03/20/14 2051  TROPONINI <0.03   CBG:  Recent Labs Lab 03/26/14 0739 03/26/14 1152 03/26/14 1646 03/26/14 2144 03/27/14 0755 03/27/14 1158  GLUCAP 101* 105* 123* 108* 95 123*   Thyroid Function Tests:  Recent Labs Lab 03/20/14 2051  TSH 3.291    Micro Results: Recent Results (from the past 240 hour(s))  Urine culture     Status: None   Collection Time: 03/20/14  1:00 PM  Result Value Ref Range Status   Specimen Description URINE, RANDOM  Final   Special Requests NONE  Final   Colony Count   Final    70,000 COLONIES/ML Performed at [facility]    Culture   Final    Multiple bacterial morphotypes present, none predominant. Suggest appropriate recollection if clinically indicated. Performed at [facility]    Report Status 03/22/2014 FINAL  Final  MRSA PCR Screening     Status: None   Collection Time: 03/20/14  8:37 PM  Result Value Ref Range Status   MRSA by PCR NEGATIVE NEGATIVE Final    Comment:        The GeneXpert MRSA Assay (FDA approved for NASAL specimens only), is one component of a comprehensive MRSA colonization surveillance program. It is not intended to diagnose MRSA infection nor to guide or monitor treatment for MRSA infections.    Studies/Results: Ct Head Wo Contrast  03/26/2014   CLINICAL DATA:  Altered mental status.  Confusion.  EXAM: CT HEAD WITHOUT CONTRAST  TECHNIQUE: Contiguous axial images were obtained from the base of the skull through the vertex without intravenous contrast.  COMPARISON:  03/20/2014  FINDINGS: There is no intracranial hemorrhage, mass or evidence of acute infarction. There is moderate  generalized atrophy. There is chronic microvascular ischemic disease. No acute findings are evident. No interval change is evident.  IMPRESSION: Chronic ischemic appearing changes.  No acute findings.   Electronically Signed   By: Andreas Newport M.D.   On: 03/26/2014 13:56   Dg Chest Port 1 View  03/26/2014   CLINICAL DATA:  Pneumonia.  EXAM: PORTABLE CHEST - 1 VIEW  COMPARISON:  03/21/2014.  FINDINGS: Mediastinum and hilar structures normal. Cardiomegaly with normal pulmonary vascularity. Mild pulmonary interstitial prominence noted suggesting mild CHF and/or pneumonitis. Small right pleural effusion cannot be excluded. No pneumothorax.  IMPRESSION: 1. Cardiomegaly. 2. Mild pulmonary interstitial prominence consistent with mild interstitial  edema and/or pneumonitis. Small right pleural effusion cannot be excluded .   Electronically Signed   By: Marcello Moores  Register   On: 03/26/2014 11:21   Medications: I have reviewed the patient's current medications. Scheduled Meds: . buPROPion  100 mg Oral Daily  . enoxaparin (LOVENOX) injection  30 mg Subcutaneous Q24H  . famotidine  20 mg Oral BID  . feeding supplement (ENSURE COMPLETE)  237 mL Oral BID BM  . insulin aspart  0-9 Units Subcutaneous TID WC  . levothyroxine  75 mcg Oral Daily  . metoprolol  50 mg Oral BID  . senna-docusate  2 tablet Oral QHS  . tiotropium  18 mcg Inhalation Daily   Continuous Infusions: . sodium chloride     PRN Meds:. Assessment/Plan: Altered mental status: Improving today though not back to baseline as she is not voiding independently. Infection less likely as she just finished treatment for pneumonia. Head CT unremarkable for acute process. Poor PO intake in the setting of her altered mental status these last 2 days. IV access resulted in the R arm ecchymosis so need to consider risks & benefits. -Check BMET and if BUN/Crt > 20, will give NS @ 50cc/hr x 10 hours tonight -Continue Wellbutrin per Psych recs -Continue sitter for fall risk -Reassess with PT for today -Speak with HCPOA & SNF to collect more information about her baseline -Give Risperdal 0.25mg  QHS today  Acute urinary retention: Appears fluctuating though will need to see if she can try today given her increased level of alertness.  -Continue bladder scan q6-8h with I&O for >400cc of urine  Finger/wrist swelling: R wrist swelling/pain likely 2/2 occluded IV and should resolve though appears stable today. Infectious causes less likely given that she just completed abx therapy. Opportunistic infection possible though would likely cause worsening symptoms which she does not have. Thrombosis unlikely given that she is on Lovenox. -Continue reassesing  Chronic a-fib: Last seen by Dr. Rockey Situ  [Cardiology] on 01/19/14 at which time anticoagulation was deferred given her fall risk. Echo with EF 55-60% with mild LVH, L atrial enlargement, pulmonary HTN in September 2011.  -Continue metoprolol 50mg  BID  DM2: A1c 7.7, April 2011. CBGs trending mostly 100s. -Check CBGs TID WC & SSI-S   Hypothyroidism: Continue Synthroid 29mcg.  COPD: Continue Spiriva   GERD: Continue Pepcid   Constipation: Continue Senokot-S.  Depression: Continue Zoloft.  #FEN:  -Diet: Heart Healthy  #DVT prophylaxis: Lovenox  #CODE STATUS: DNR/DNI -Defer to [place] [phone] for decisions -Confirmed on admission that patient would want to be kept comfortable should she decline further  Dispo: Disposition is deferred at this time, awaiting improvement of current medical problems.     The patient does have a current PCP [place] Pink, MD) and does not need an [facility] hospital follow-up appointment after discharge.  The patient does not have transportation limitations that hinder transportation to clinic appointments.  .Services Needed at time of discharge: Y = Yes, Blank = No PT:   OT:   RN:   Equipment:   Other:     LOS: 7 days   Charlott Rakes, MD 03/27/2014, 2:42 PM`

## 2014-03-27 NOTE — Progress Notes (Signed)
Internal Medicine Attending  Date: 03/27/2014  Patient name: [name] Medical record number: [id number] Date of birth: 09-Aug-1924 Age: 81 y.o. Gender: female  I saw and evaluated the patient on AM rounds. I discussed patient and reviewed the resident's note by Dr. Posey Pronto, and I agree with the resident's findings and plans as documented in his note, with the following additional comments.  Yesterday patient was extremely somnolent, possibly due to persisting effects of the risperidone; she reportedly became much more alert yesterday evening, and this morning is alert but distraught and tearful.  Exam otherwise shows no significant change.  She has an ecchymotic area on her right arm which may be a complication of previous IV; this area is nontender, with no mass effect, and her right radial pulse is intact.  We are waiting additional input from psychiatry regarding management of her distress/tearfulness.

## 2014-03-27 NOTE — Consult Note (Signed)
Psychiatry Consult follow-up note  Reason for Consult:  Dementia with AMS and medication managment Referring Physician:  Dr. Posey Pronto  Patient Identification: [name] MRN:  [id number] Principal Diagnosis: Senile dementia, uncomplicated Diagnosis:   Patient Active Problem List   Diagnosis Date Noted  . Urinary retention [R33.9]   . Altered mental status [R41.82]   . HCAP (healthcare-associated pneumonia) [J18.9] 03/20/2014  . Weakness [R53.1] 03/20/2014  . Intertrochanteric fracture of right hip [S72.141A] 10/30/2013  . Fracture of right humerus [S42.301A] 10/30/2013  . Essential hypertension, benign [I10] 10/30/2013  . CHF, chronic [I50.9] 10/30/2013  . Type II or unspecified type diabetes mellitus with unspecified complication, uncontrolled [E11.8] 10/30/2013  . Localized swelling of both lower legs [R22.43] 09/11/2013  . Atrial fibrillation [I48.91] 08/21/2013  . Iron deficiency anemia, unspecified [D50.9] 07/15/2012  . Falls [id number]] 04/07/2011  . Senile dementia, uncomplicated [V61.60] 73/71/0626  . OSTEOARTHRITIS [M19.90] 10/17/2006  . HYPOTHYROIDISM [E03.9] 10/13/2006  . ANXIETY [F41.1] 10/13/2006  . DEPRESSION [F32.9] 10/13/2006  . COPD with chronic bronchitis [J44.9] 10/13/2006  . HIATAL HERNIA WITH REFLUX [K44.9] 10/13/2006  . DIVERTICULOSIS, COLON [K57.30] 10/13/2006  . SLEEP DISORDER [G47.9] 07/20/2006    Total Time spent with patient: 30 minutes  Subjective:   [name] is a 81 y.o. female patient admitted with dementia with AMS.  HPI:  [name] is a 81 yo female seen for psychiatric consultation and evaluation of dementia with the decreased psychomotor activities, altered mental status and deterioration of cognition since he was placed in a skilled nursing facility that was 02/20/2014. Information for this evaluation of pain from the available medical records and face-to-face evaluation with patient. Reportedly her baseline is able to care for  herself and feed herself which was deteriorated over the last few weeks. Patient was treated with Depakote for agitation and other behavioral problems which made her more slowed down. Patient is a poor historian she is oriented to only her name. Patient has been a poor communicator and difficult to understand because of garbled language. No family members are available at at bedside. Patient has no previous acute psychiatric hospitalization as per the electronic health records.  Interval history: Patient appeared lying down in her room, awake but not alert or oriented to her surroundings. Patient seems to be emotional and tearful for unknown stresses. Patient speech was not comprehensible during this evaluation. Case discussed with the Dr. Posey Pronto who stated that patient clinical workup is negative and positively responded rate using her medication risperidone. Patient continued to be emotionally disturbed and agitated from time to time so recommended to provide risperidone 0.25 mg at bedtime and also restart fluoxetine 10 mg daily instead of Wellbutrin which was not helpful.    Past Medical History:  Past Medical History  Diagnosis Date  . Hypertension   . Diabetes mellitus   . Hypothyroidism   . Vertigo   . Anemia   . Osteoarthritis   . GERD (gastroesophageal reflux disease)   . Diverticulosis   . Depression   . H/O: hysterectomy     at age 70  . COPD (chronic obstructive pulmonary disease)   . History of DVT (deep vein thrombosis)   . Anxiety   . Dyspnea   . Enlarged heart   . Fibromyalgia   . Degenerative joint disease   . Degenerative disk disease   . Tremor   . Falls   . Iron deficiency anemia, unspecified 07/15/2012  . CHF (congestive heart failure)   . Cancer  basal cell  . Atrial fibrillation 08/21/2013  . HIATAL HERNIA WITH REFLUX 10/13/2006    Qualifier: Diagnosis of  By: Silvio Pate MD, [facility] Cancer   . COPD with chronic bronchitis 10/13/2006    Never a primary smoker, but  secondhand smoke from husband by history. Emphysema and chronic bronchitis, mild.    . Senile dementia, uncomplicated 0/97/3532    Qualifier: Diagnosis of  By: Copland MD, Frederico Hamman    . VERTIGO 10/02/2008    Qualifier: Diagnosis of  By: Lorelei Pont MD, Frederico Hamman    . TREMOR, ESSENTIAL 10/13/2006    Qualifier: Diagnosis of  By: Silvio Pate MD, [facility]aird Cancer   . POSITIVE PPD 06/02/2007    Qualifier: Diagnosis of  By: Julien Girt CMA, Marliss Czar    . COLONIC POLYPS, HX OF 10/13/2006    Qualifier: Diagnosis of  By: Silvio Pate MD, Baird Cancer     Past Surgical History  Procedure Laterality Date  . Cholecystectomy  1944  . Appendectomy  1952  . Ovarian cyst removal  1982    secondary ventral hematoma  . Bladder tack  1997  . Cataract extraction, bilateral    . Kyphosis surgery    . Total abdominal hysterectomy    . Salpingoophorectomy      unilateral salpingo-oophorectomy  . Orif right hip     Family History:  Family History  Problem Relation Age of Onset  . Stroke Mother   . Cancer Father     Pancreatic Cancer  . Kidney disease Sister   . Heart disease Brother   . Stroke Brother   . Heart disease Brother    Social History:  History  Alcohol Use No     History  Drug Use No    History   Social History  . Marital Status: Widowed    Spouse Name: N/A    Number of Children: N/A  . Years of Education: N/A   Social History Main Topics  . Smoking status: Never Smoker   . Smokeless tobacco: Never Used  . Alcohol Use: No  . Drug Use: No  . Sexual Activity: No   Other Topics Concern  . None   Social History Narrative   Additional Social History:                          Allergies:   Allergies  Allergen Reactions  . Aricept [Donepezil Hydrochloride]     REACTION: delusions  . Depakote [Divalproex [place] stated they suspect Pt is having an allergic reaction to med  . Hydrocodone-Acetaminophen     REACTION: U/K  . Metoprolol     REACTION: dizzy  . Nitrofurantoin   .  Sulfonamide Derivatives     REACTION: unspecified    Vitals: Blood pressure 153/91, pulse 77, temperature 98.9 F (37.2 C), temperature source Oral, resp. rate 32, height 5' (1.524 m), weight 42.5 kg (93 lb 11.1 oz), SpO2 95 %.  Risk to Self: Is patient at risk for suicide?: No Risk to Others:   Prior Inpatient Therapy:   Prior Outpatient Therapy:    Current Facility-Administered Medications  Medication Dose Route Frequency Provider Last Rate Last Dose  . enoxaparin (LOVENOX) injection 30 mg  30 mg Subcutaneous Q24H Ejiroghene E Emokpae, MD   30 mg at 03/26/14 2221  . famotidine (PEPCID) tablet 20 mg  20 mg Oral BID Bethena Roys, MD   20 mg at 03/27/14 0957  . feeding supplement (ENSURE  COMPLETE) (ENSURE COMPLETE) liquid 237 mL  237 mL Oral BID BM Charlott Rakes, MD   237 mL at 03/27/14 1430  . FLUoxetine (PROZAC) capsule 10 mg  10 mg Oral Daily Durward Parcel, MD      . ibuprofen (ADVIL,MOTRIN) tablet 600 mg  600 mg Oral Q6H PRN Kelby Aline, MD   600 mg at 03/25/14 2344  . insulin aspart (novoLOG) injection 0-9 Units  0-9 Units Subcutaneous TID WC Kelby Aline, MD   1 Units at 03/27/14 1301  . levothyroxine (SYNTHROID, LEVOTHROID) tablet 75 mcg  75 mcg Oral Daily Bethena Roys, MD   75 mcg at 03/27/14 0957  . metoprolol (LOPRESSOR) tablet 50 mg  50 mg Oral BID Bethena Roys, MD   50 mg at 03/27/14 0957  . risperiDONE (RISPERDAL) tablet 0.25 mg  0.25 mg Oral QHS Charlott Rakes, MD      . senna-docusate (Senokot-S) tablet 2 tablet  2 tablet Oral QHS Bethena Roys, MD   2 tablet at 03/26/14 2221  . tiotropium (SPIRIVA) inhalation capsule 18 mcg  18 mcg Inhalation Daily Bethena Roys, MD   18 mcg at 03/27/14 0806    Musculoskeletal: Strength & Muscle Tone: decreased Gait & Station: unable to stand Patient leans: N/A  Psychiatric Specialty Exam: Physical Exam as per the history and physical   ROS generalized weakness, cognitive  deterioration and poor historian   Blood pressure 153/91, pulse 77, temperature 98.9 F (37.2 C), temperature source Oral, resp. rate 32, height 5' (1.524 m), weight 42.5 kg (93 lb 11.1 oz), SpO2 95 %.Body mass index is 18.3 kg/(m^2).  General Appearance: Guarded  Eye Contact::  Fair  Speech:  Garbled and Slow  Volume:  Decreased  Mood:  Anxious and Depressed  Affect:  Tearful  Thought Process:  Disorganized and Irrelevant  Orientation:  Other:  Oriented to her name only  Thought Content:  Rumination  Suicidal Thoughts:  No  Homicidal Thoughts:  No  Memory:  Immediate;   Poor Recent;   Poor  Judgement:  Impaired  Insight:  Shallow  Psychomotor Activity:  Decreased  Concentration:  Poor  Recall:  Poor  Fund of Knowledge:Poor  Language: Fair  Akathisia:  NA  Handed:  Right  AIMS (if indicated):     Assets:  Catering manager Housing Leisure Time Social Support  ADL's:  Impaired  Cognition: Impaired,  Severe  Sleep:      Medical Decision Making: Self-Limited or Minor (1), New problem, with additional work up planned, Review of Psycho-Social Stressors (1), Review or order clinical lab tests (1), Discuss test with performing physician (1), Established Problem, Worsening (2), Review or order medicine tests (1), Independent Review of image, tracing or specimen (2), Review of Medication Regimen & Side Effects (2) and Review of New Medication or Change in Dosage (2)  Treatment Plan Summary: Daily contact with patient to assess and evaluate symptoms and progress in treatment and Medication management  Plan:  Discontinue Wellbutrin SR 100 mg daily -not helpful We will restart fluoxetine 10 mg daily for depression  Decreased risperidone 0.25 mgat bedtime for increased agitation and insomnia  Supportive therapy provided about ongoing stressors.   Disposition: Patient may return to skilled nursing facility when medically stable  Cielle Aguila,JANARDHAHA R. 03/27/2014 5:03  PM

## 2014-03-27 NOTE — Clinical Social Work Note (Signed)
Clinical Social Worker continuing to follow patient and family for support and discharge planning needs.  Patient is from [facility] and will return at discharge.  CSW spoke with [employer] who is going to submit clinicals for possible insurance authorization but feel certain patient will be denied and be private pay.  CSW spoke with patient legal guardian who is understanding of the process and agreeable with plan.  Patient legal guardian states that patient is able to return financially even as private pay if needed.  CSW remains available for support and to facilitate patient discharge needs.  Barbette Or, [place]

## 2014-03-28 DIAGNOSIS — R58 Hemorrhage, not elsewhere classified: Secondary | ICD-10-CM

## 2014-03-28 LAB — GLUCOSE, CAPILLARY
GLUCOSE-CAPILLARY: 103 mg/dL — AB (ref 70–99)
Glucose-Capillary: 137 mg/dL — ABNORMAL HIGH (ref 70–99)
Glucose-Capillary: 146 mg/dL — ABNORMAL HIGH (ref 70–99)

## 2014-03-28 MED ORDER — ENSURE PUDDING PO PUDG
1.0000 | Freq: Three times a day (TID) | ORAL | Status: DC
  Administered 2014-03-28: 1 via ORAL

## 2014-03-28 MED ORDER — ENSURE COMPLETE PO LIQD: 237.0000 mL | Freq: Two times a day (BID) | ORAL | Status: DC

## 2014-03-28 MED ORDER — RISPERIDONE 0.25 MG PO TABS: ORAL_TABLET | ORAL | Status: DC

## 2014-03-28 NOTE — Progress Notes (Signed)
Report Called to [place] at [facility], all questions answered.

## 2014-03-28 NOTE — Progress Notes (Signed)
NUTRITION FOLLOW UP  Pt meets criteria for severe MALNUTRITION in the context of chronic illness as evidenced by 21% wt loss in <3 months and moderate fat and muscle wasting.  Intervention:   -Ensure Pudding po TID, each supplement provides 170 kcal and 4 grams of protein -Continue Ensure Complete po BID, each supplement provides 350 kcal and 13 grams of protein -Continue Magic Cup TID with meals   Nutrition Dx:   Inadequate oral intake related to dementia as evidenced by poor po and wt loss; ongoing  Goal:   Pt to meet >/= 90% of their estimated nutrition needs; unmet  Monitor:   Weight trend, po intake, acceptance of supplements, labs   Assessment:   81 yo female with a PMHx of Atrial Fibrillation, CHF, dementia, hypothyroidism, anemia, GERD, depression, COPD, degenerative joint disease with several falls and fractures who presents today from a nursing home with weakness (as reported by nursing home).  Pt unable to provide hx due to confusion. Appetite remains poor. Noted 30-50% meal completion, with some refusal of meals.  Pt continues with Ensure Complete, however, has only taken a few sips out of cup at bedside. Noted that nursing has also been administering Ensure Pudding with meds- will order to help maximize nutritional adequacy.  Psychiatry following due to AMS.  CSW following. Plan is to discharge to [facility] once medically stable.  Labs reviewed. K: 3.2, Glucose: 135.   Height: Ht Readings from Last 1 Encounters:  03/26/14 5' (1.524 m)    Weight Status:   Wt Readings from Last 1 Encounters:  03/20/14 93 lb 11.1 oz (42.5 kg)    Re-estimated needs:  Kcal: 1200-1400 Protein: 60-70 g Fluid: 1.4 L/day  Skin: skin tears on rt and lt arms; abrasions; ecchymosis  Diet Order: Diet Heart   Intake/Output Summary (Last 24 hours) at 03/28/14 0911 Last data filed at 03/27/14 2156  Gross per 24 hour  Intake    150 ml  Output    800 ml  Net   -650 ml     Last BM: 03/22/14   Labs:   Recent Labs Lab 03/22/14 0950 03/24/14 0434 03/27/14 1916  NA 136 137 139  K 3.7 3.5 3.2*  CL 103 104 104  CO2 20 23 22   BUN 9 11 20   CREATININE 0.65 0.65 0.72  CALCIUM 9.0 8.9 8.9  GLUCOSE 100* 119* 135*    CBG (last 3)   Recent Labs  03/27/14 1711 03/27/14 2242 03/28/14 0813  GLUCAP 156* 116* 103*    Scheduled Meds: . enoxaparin (LOVENOX) injection  30 mg Subcutaneous Q24H  . famotidine  20 mg Oral BID  . feeding supplement (ENSURE COMPLETE)  237 mL Oral BID BM  . FLUoxetine  10 mg Oral Daily  . insulin aspart  0-9 Units Subcutaneous TID WC  . levothyroxine  75 mcg Oral Daily  . metoprolol  50 mg Oral BID  . risperiDONE  0.25 mg Oral QHS  . senna-docusate  2 tablet Oral QHS  . tiotropium  18 mcg Inhalation Daily    Continuous Infusions:   Samaj Wessells A. Jimmye Norman, RD, LDN, CDE Pager: [phone] After hours Pager: [phone]

## 2014-03-28 NOTE — Clinical Social Work Note (Signed)
Per MD patient ready to DC back to [facility]. RN, patient/family (messages left for legal guardian, CSW also spoke with daughter by phone who states she will make sure guardian is aware), and facility notified of patient's DC. RN given number for report. DC packet on patient's chart. Ambulance transport requested for patient. CSW signing off at this time.   Liz Beach MSW, [place], [place], [phone]

## 2014-03-28 NOTE — Progress Notes (Signed)
[name] to be D/C'd Skilled nursing facility per MD order.  Discussed with the patient and all questions fully answered.    Medication List    STOP taking these medications        cetirizine 10 MG tablet  Commonly known as:  ZYRTEC     ibuprofen 200 MG tablet  Commonly known as:  ADVIL,MOTRIN     omeprazole 20 MG capsule  Commonly known as:  PRILOSEC     ranitidine 75 MG tablet  Commonly known as:  ZANTAC     traMADol 50 MG tablet  Commonly known as:  ULTRAM      TAKE these medications        acetaminophen 325 MG tablet  Commonly known as:  TYLENOL  Take 650 mg by mouth every 4 (four) hours as needed for mild pain.     aspirin 81 MG chewable tablet  Chew 81 mg by mouth daily.     clorazepate 3.75 MG tablet  Commonly known as:  TRANXENE  Take 1 tablet (3.75 mg total) by mouth at bedtime.     famotidine 20 MG tablet  Commonly known as:  PEPCID  Take 20 mg by mouth 2 (two) times daily.     feeding supplement (ENSURE COMPLETE) Liqd  Take 237 mLs by mouth 2 (two) times daily between meals.     furosemide 20 MG tablet  Commonly known as:  LASIX  Take 1 tablet (20 mg total) by mouth 2 (two) times daily as needed. For weight gain of 4 pounds or more daily     hydrOXYzine 10 MG tablet  Commonly known as:  ATARAX/VISTARIL  Take 10 mg by mouth every 8 (eight) hours as needed.     levothyroxine 75 MCG tablet  Commonly known as:  SYNTHROID, LEVOTHROID  Take 75 mcg by mouth daily.     magnesium oxide 400 MG tablet  Commonly known as:  MAG-OX  Take 400 mg by mouth daily.     metFORMIN 500 MG tablet  Commonly known as:  GLUCOPHAGE  Take 500 mg by mouth 2 (two) times daily with a meal.     metoprolol 50 MG tablet  Commonly known as:  LOPRESSOR  Take 50 mg by mouth 2 (two) times daily.     MULTIVITAMIN PO  Take 1 tablet by mouth daily.     potassium chloride 10 MEQ tablet  Commonly known as:  K-DUR  Take 1 tablet (10 mEq total) by mouth 2 (two) times  daily as needed. To take with lasix     risperiDONE 0.25 MG tablet  Commonly known as:  RISPERDAL  Take 1 tablet in early evening [6-7pm].     sennosides-docusate sodium 8.6-50 MG tablet  Commonly known as:  SENOKOT-S  Take 2 tablets by mouth at bedtime.     sertraline 50 MG tablet  Commonly known as:  ZOLOFT  Take 50 mg by mouth daily.     tapentadol 50 MG Tabs tablet  Commonly known as:  NUCYNTA  Take one tablet by mouth every 6 hours as needed for severe pain     tiotropium 18 MCG inhalation capsule  Commonly known as:  SPIRIVA  Place 1 capsule (18 mcg total) into inhaler and inhale daily.     Vitamin D (Ergocalciferol) 50000 UNITS Caps capsule  Commonly known as:  DRISDOL  Take 50,000 Units by mouth every 7 (seven) days.        [facility]. Pt escorted via  [facility].    Delman Cheadle 03/28/2014 5:43 PM

## 2014-03-28 NOTE — Clinical Social Work Note (Signed)
CSW has made legal guardian [name] aware of patient's DC back to [facility] today.   Liz Beach MSW, [place], [place], [phone]

## 2014-03-28 NOTE — Progress Notes (Signed)
Internal Medicine Attending  Date: 03/28/2014  Patient name: [name] Medical record number: [id number] Date of birth: 05-10-1924 Age: 81 y.o. Gender: female  I saw and evaluated the patient, and discussed her care with resident on A.M rounds.  I reviewed the resident's note by Dr. Posey Pronto and I agree with the resident's findings and plans as documented in his note, with the following additional comments.  Patient is doing well, alert and calm, pleasant and interactive.  She is stable for discharge back to her skilled nursing facility today.  The largest bladder scan volume today has been 188 mL's.  She will need attention at the skilled nursing facility to make sure she does not again develop urinary retention, but this appears to have resolved.

## 2014-03-28 NOTE — Progress Notes (Addendum)
Subjective: Overnight, BUN/Crt ratio was >20 so she was given IV fluids. K was also repleted. This AM, she appeared pleasant and interactive on exam. AM bladder scan with 150 cc of urine. She reports feeling better and good.  Objective: Vital signs in last 24 hours: Filed Vitals:   03/27/14 1425 03/27/14 1430 03/27/14 2227 03/28/14 0613  BP:   141/78 131/85  Pulse: 77   70  Temp:    98 F (36.7 C)  TempSrc:    Oral  Resp:   20 16  Height:      Weight:      SpO2: 80% 95%  97%   Weight change:   Intake/Output Summary (Last 24 hours) at 03/28/14 1235 Last data filed at 03/28/14 0938  Gross per 24 hour  Intake     60 ml  Output    300 ml  Net   -240 ml   General: resting in bed, smiling, no acute distress HEENT: pupil exam deferred, no scleral icterus Cardiac: RRR, no rubs, murmurs or gallops Pulm: clear to auscultation bilaterally, no wheezes, rales, or rhonchi in the anterior lung fields Abd: soft, nontender, nondistended, BS present Ext: warm and well perfused, DP pulses present bilaterally, R arm with ecchymosis and not tender to touch nor with passive movement, no pedal edema Neuro: smiling and answering questions as directed  Lab Results: Basic Metabolic Panel:  Recent Labs Lab 03/24/14 0434 03/27/14 1916  NA 137 139  K 3.5 3.2*  CL 104 104  CO2 23 22  GLUCOSE 119* 135*  BUN 11 20  CREATININE 0.65 0.72  CALCIUM 8.9 8.9   CBC:  Recent Labs Lab 03/25/14 0425  WBC 8.9  NEUTROABS 5.5  HGB 13.0  HCT 39.7  MCV 82.0  PLT 236   CBG:  Recent Labs Lab 03/27/14 0755 03/27/14 1158 03/27/14 1711 03/27/14 2242 03/28/14 0813 03/28/14 1220  GLUCAP 95 123* 156* 116* 103* 137*   Micro Results: Recent Results (from the past 240 hour(s))  Urine culture     Status: None   Collection Time: 03/20/14  1:00 PM  Result Value Ref Range Status   Specimen Description URINE, RANDOM  Final   Special Requests NONE  Final   Colony Count   Final    70,000  COLONIES/ML Performed at [facility]    Culture   Final    Multiple bacterial morphotypes present, none predominant. Suggest appropriate recollection if clinically indicated. Performed at [facility]    Report Status 03/22/2014 FINAL  Final  MRSA PCR Screening     Status: None   Collection Time: 03/20/14  8:37 PM  Result Value Ref Range Status   MRSA by PCR NEGATIVE NEGATIVE Final    Comment:        The GeneXpert MRSA Assay (FDA approved for NASAL specimens only), is one component of a comprehensive MRSA colonization surveillance program. It is not intended to diagnose MRSA infection nor to guide or monitor treatment for MRSA infections.    Studies/Results: Ct Head Wo Contrast  03/26/2014   CLINICAL DATA:  Altered mental status.  Confusion.  EXAM: CT HEAD WITHOUT CONTRAST  TECHNIQUE: Contiguous axial images were obtained from the base of the skull through the vertex without intravenous contrast.  COMPARISON:  03/20/2014  FINDINGS: There is no intracranial hemorrhage, mass or evidence of acute infarction. There is moderate generalized atrophy. There is chronic microvascular ischemic disease. No acute findings are evident. No interval change is evident.  IMPRESSION: Chronic ischemic appearing changes.  No acute findings.   Electronically Signed   By: Andreas Newport M.D.   On: 03/26/2014 13:56   Medications: I have reviewed the patient's current medications. Scheduled Meds: . enoxaparin (LOVENOX) injection  30 mg Subcutaneous Q24H  . famotidine  20 mg Oral BID  . feeding supplement (ENSURE COMPLETE)  237 mL Oral BID BM  . FLUoxetine  10 mg Oral Daily  . insulin aspart  0-9 Units Subcutaneous TID WC  . levothyroxine  75 mcg Oral Daily  . metoprolol  50 mg Oral BID  . risperiDONE  0.25 mg Oral QHS  . senna-docusate  2 tablet Oral QHS  . tiotropium  18 mcg Inhalation Daily   Continuous Infusions:   PRN Meds:. Assessment/Plan: Altered mental status: Improved  today than on admission. Risperdal helped though required dosing earlier in the evening. Switched back to Zoloft per Psychiatry given minimal improvement on Wellbutrin.  -Update HCPOA of her improvement  Acute urinary retention: Bladder scan with 150cc of urine. Will need to be followed at the SNF with possible Urology follow-up and Foley placement should be no longer be able to void.  R arm ecchymosis: Likely 2/2 occluded IV and continuing to improve.   Chronic a-fib: Last seen by Dr. Rockey Situ [Cardiology] on 01/19/14 at which time anticoagulation was deferred given her fall risk. Echo with EF 55-60% with mild LVH, L atrial enlargement, pulmonary HTN in September 2011.  -Discharge on metoprolol 50mg  BID  DM2: A1c 7.7, April 2011. CBGs trending mostly 100s. -Resume home metformin on discharge  Hypothyroidism: Continue Synthroid 49mcg.  COPD: Continue Spiriva   GERD: Continue Pepcid   Constipation: Continue Senokot-S.  Depression: Continue Zoloft.  #FEN:  -Diet: Heart Healthy  #DVT prophylaxis: Lovenox  #CODE STATUS: DNR/DNI -Defer to [place] [phone] for decisions -Confirmed on admission that patient would want to be kept comfortable should she decline further  Dispo: Stable for discharge today.   The patient does have a current PCP [place] Pink, MD) and does not need an [facility] hospital follow-up appointment after discharge.  The patient does not have transportation limitations that hinder transportation to clinic appointments.  .Services Needed at time of discharge: Y = Yes, Blank = No PT:   OT:   RN:   Equipment:   Other:     LOS: 8 days   Charlott Rakes, MD 03/28/2014, 12:35 PM`

## 2014-04-09 ENCOUNTER — Other Ambulatory Visit: Payer: Self-pay | Admitting: Family

## 2014-05-28 ENCOUNTER — Inpatient Hospital Stay (HOSPITAL_COMMUNITY)
Admission: EM | Admit: 2014-05-28 | Discharge: 2014-05-31 | DRG: 071 | Disposition: A | Discharge status: Hospice | Payer: Medicare PPO | Attending: Internal Medicine | Admitting: Internal Medicine

## 2014-05-28 ENCOUNTER — Encounter (HOSPITAL_COMMUNITY): Payer: Self-pay | Admitting: Emergency Medicine

## 2014-05-28 DIAGNOSIS — Z823 Family history of stroke: Secondary | ICD-10-CM | POA: Diagnosis not present

## 2014-05-28 DIAGNOSIS — G250 Essential tremor: Secondary | ICD-10-CM | POA: Diagnosis present

## 2014-05-28 DIAGNOSIS — Z880 Allergy status to penicillin: Secondary | ICD-10-CM | POA: Diagnosis not present

## 2014-05-28 DIAGNOSIS — Z66 Do not resuscitate: Secondary | ICD-10-CM | POA: Diagnosis present

## 2014-05-28 DIAGNOSIS — I4891 Unspecified atrial fibrillation: Secondary | ICD-10-CM | POA: Diagnosis present

## 2014-05-28 DIAGNOSIS — Z7982 Long term (current) use of aspirin: Secondary | ICD-10-CM

## 2014-05-28 DIAGNOSIS — E876 Hypokalemia: Secondary | ICD-10-CM

## 2014-05-28 DIAGNOSIS — G9340 Encephalopathy, unspecified: Secondary | ICD-10-CM | POA: Diagnosis not present

## 2014-05-28 DIAGNOSIS — F411 Generalized anxiety disorder: Secondary | ICD-10-CM | POA: Diagnosis present

## 2014-05-28 DIAGNOSIS — I10 Essential (primary) hypertension: Secondary | ICD-10-CM | POA: Diagnosis present

## 2014-05-28 DIAGNOSIS — E861 Hypovolemia: Secondary | ICD-10-CM | POA: Diagnosis present

## 2014-05-28 DIAGNOSIS — E039 Hypothyroidism, unspecified: Secondary | ICD-10-CM | POA: Diagnosis present

## 2014-05-28 DIAGNOSIS — E870 Hyperosmolality and hypernatremia: Secondary | ICD-10-CM | POA: Diagnosis present

## 2014-05-28 DIAGNOSIS — Z86718 Personal history of other venous thrombosis and embolism: Secondary | ICD-10-CM

## 2014-05-28 DIAGNOSIS — G9341 Metabolic encephalopathy: Principal | ICD-10-CM | POA: Diagnosis present

## 2014-05-28 DIAGNOSIS — Z8701 Personal history of pneumonia (recurrent): Secondary | ICD-10-CM

## 2014-05-28 DIAGNOSIS — Z8249 Family history of ischemic heart disease and other diseases of the circulatory system: Secondary | ICD-10-CM

## 2014-05-28 DIAGNOSIS — N390 Urinary tract infection, site not specified: Secondary | ICD-10-CM | POA: Diagnosis present

## 2014-05-28 DIAGNOSIS — Z888 Allergy status to other drugs, medicaments and biological substances status: Secondary | ICD-10-CM | POA: Diagnosis not present

## 2014-05-28 DIAGNOSIS — Z87440 Personal history of urinary (tract) infections: Secondary | ICD-10-CM | POA: Diagnosis not present

## 2014-05-28 DIAGNOSIS — I509 Heart failure, unspecified: Secondary | ICD-10-CM | POA: Diagnosis present

## 2014-05-28 DIAGNOSIS — J449 Chronic obstructive pulmonary disease, unspecified: Secondary | ICD-10-CM | POA: Diagnosis present

## 2014-05-28 DIAGNOSIS — Z885 Allergy status to narcotic agent status: Secondary | ICD-10-CM

## 2014-05-28 DIAGNOSIS — E860 Dehydration: Secondary | ICD-10-CM | POA: Diagnosis present

## 2014-05-28 DIAGNOSIS — Z515 Encounter for palliative care: Secondary | ICD-10-CM

## 2014-05-28 DIAGNOSIS — Z800 Family history of malignant neoplasm of digestive organs: Secondary | ICD-10-CM

## 2014-05-28 DIAGNOSIS — Z79899 Other long term (current) drug therapy: Secondary | ICD-10-CM | POA: Diagnosis not present

## 2014-05-28 DIAGNOSIS — G3183 Dementia with Lewy bodies: Secondary | ICD-10-CM | POA: Diagnosis present

## 2014-05-28 DIAGNOSIS — R627 Adult failure to thrive: Secondary | ICD-10-CM | POA: Diagnosis present

## 2014-05-28 DIAGNOSIS — K219 Gastro-esophageal reflux disease without esophagitis: Secondary | ICD-10-CM | POA: Diagnosis present

## 2014-05-28 DIAGNOSIS — R6251 Failure to thrive (child): Secondary | ICD-10-CM

## 2014-05-28 DIAGNOSIS — Z86010 Personal history of colonic polyps: Secondary | ICD-10-CM | POA: Diagnosis not present

## 2014-05-28 DIAGNOSIS — N179 Acute kidney failure, unspecified: Secondary | ICD-10-CM | POA: Diagnosis present

## 2014-05-28 DIAGNOSIS — M1990 Unspecified osteoarthritis, unspecified site: Secondary | ICD-10-CM | POA: Diagnosis present

## 2014-05-28 DIAGNOSIS — Z882 Allergy status to sulfonamides status: Secondary | ICD-10-CM

## 2014-05-28 DIAGNOSIS — R4189 Other symptoms and signs involving cognitive functions and awareness: Secondary | ICD-10-CM

## 2014-05-28 DIAGNOSIS — E119 Type 2 diabetes mellitus without complications: Secondary | ICD-10-CM

## 2014-05-28 DIAGNOSIS — M797 Fibromyalgia: Secondary | ICD-10-CM | POA: Diagnosis present

## 2014-05-28 DIAGNOSIS — R531 Weakness: Secondary | ICD-10-CM | POA: Diagnosis present

## 2014-05-28 DIAGNOSIS — Z90710 Acquired absence of both cervix and uterus: Secondary | ICD-10-CM

## 2014-05-28 LAB — COMPREHENSIVE METABOLIC PANEL
ALT: 28 U/L (ref 0–35)
AST: 39 U/L — ABNORMAL HIGH (ref 0–37)
Albumin: 3.3 g/dL — ABNORMAL LOW (ref 3.5–5.2)
Alkaline Phosphatase: 112 U/L (ref 39–117)
Anion gap: 12 (ref 5–15)
BUN: 58 mg/dL — ABNORMAL HIGH (ref 6–23)
CALCIUM: 10.3 mg/dL (ref 8.4–10.5)
CO2: 28 mmol/L (ref 19–32)
CREATININE: 0.63 mg/dL (ref 0.50–1.10)
Chloride: 114 mmol/L — ABNORMAL HIGH (ref 96–112)
GFR calc Af Amer: >90 mL/min (ref >90)
GFR calc non Af Amer: 78 mL/min — ABNORMAL LOW (ref >90)
GLUCOSE: 187 mg/dL — AB (ref 70–99)
Potassium: 2.9 mmol/L — ABNORMAL LOW (ref 3.5–5.1)
SODIUM: 154 mmol/L — AB (ref 135–145)
Total Bilirubin: 2.2 mg/dL — ABNORMAL HIGH (ref 0.3–1.2)
Total Protein: 7.2 g/dL (ref 6.0–8.3)

## 2014-05-28 LAB — MRSA PCR SCREENING: MRSA BY PCR: NEGATIVE

## 2014-05-28 LAB — CBC WITH DIFFERENTIAL/PLATELET
Basophils Absolute: 0 10*3/uL (ref 0.0–0.1)
Basophils Relative: 0 % (ref 0–1)
EOS ABS: 0.1 10*3/uL (ref 0.0–0.7)
EOS PCT: 1 % (ref 0–5)
HCT: 45.9 % (ref 36.0–46.0)
HEMOGLOBIN: 14.7 g/dL (ref 12.0–15.0)
LYMPHS ABS: 1.8 10*3/uL (ref 0.7–4.0)
LYMPHS PCT: 17 % (ref 12–46)
MCH: 26.2 pg (ref 26.0–34.0)
MCHC: 32 g/dL (ref 30.0–36.0)
MCV: 81.8 fL (ref 78.0–100.0)
Monocytes Absolute: 0.9 10*3/uL (ref 0.1–1.0)
Monocytes Relative: 8 % (ref 3–12)
Neutro Abs: 7.8 10*3/uL — ABNORMAL HIGH (ref 1.7–7.7)
Neutrophils Relative %: 74 % (ref 43–77)
PLATELETS: 253 10*3/uL (ref 150–400)
RBC: 5.61 MIL/uL — AB (ref 3.87–5.11)
RDW: 19.4 % — ABNORMAL HIGH (ref 11.5–15.5)
WBC: 10.5 10*3/uL (ref 4.0–10.5)

## 2014-05-28 LAB — URINALYSIS, ROUTINE W REFLEX MICROSCOPIC
Bilirubin Urine: NEGATIVE
GLUCOSE, UA: NEGATIVE mg/dL
Hgb urine dipstick: NEGATIVE
KETONES UR: 15 mg/dL — AB
Nitrite: NEGATIVE
Protein, ur: >300 mg/dL — AB
SPECIFIC GRAVITY, URINE: 1.019 (ref 1.005–1.030)
Urobilinogen, UA: 1 mg/dL (ref 0.0–1.0)
pH: 8 (ref 5.0–8.0)

## 2014-05-28 LAB — GLUCOSE, CAPILLARY
Glucose-Capillary: 124 mg/dL — ABNORMAL HIGH (ref 70–99)
Glucose-Capillary: 125 mg/dL — ABNORMAL HIGH (ref 70–99)
Glucose-Capillary: 137 mg/dL — ABNORMAL HIGH (ref 70–99)
Glucose-Capillary: 98 mg/dL (ref 70–99)

## 2014-05-28 LAB — MAGNESIUM: Magnesium: 1.8 mg/dL (ref 1.5–2.5)

## 2014-05-28 LAB — I-STAT CHEM 8, ED
BUN: 51 mg/dL — AB (ref 6–23)
Calcium, Ion: 1.32 mmol/L — ABNORMAL HIGH (ref 1.13–1.30)
Chloride: 112 mmol/L (ref 96–112)
Creatinine, Ser: 0.6 mg/dL (ref 0.50–1.10)
GLUCOSE: 187 mg/dL — AB (ref 70–99)
HCT: 49 % — ABNORMAL HIGH (ref 36.0–46.0)
HEMOGLOBIN: 16.7 g/dL — AB (ref 12.0–15.0)
Potassium: 2.8 mmol/L — ABNORMAL LOW (ref 3.5–5.1)
Sodium: 154 mmol/L — ABNORMAL HIGH (ref 135–145)
TCO2: 24 mmol/L (ref 0–100)

## 2014-05-28 LAB — BASIC METABOLIC PANEL
Anion gap: 13 (ref 5–15)
BUN: 58 mg/dL — ABNORMAL HIGH (ref 6–23)
CO2: 25 mmol/L (ref 19–32)
CREATININE: 0.49 mg/dL — AB (ref 0.50–1.10)
Calcium: 9.9 mg/dL (ref 8.4–10.5)
Chloride: 114 mmol/L — ABNORMAL HIGH (ref 96–112)
GFR calc Af Amer: >90 mL/min (ref >90)
GFR calc non Af Amer: 85 mL/min — ABNORMAL LOW (ref >90)
Glucose, Bld: 123 mg/dL — ABNORMAL HIGH (ref 70–99)
Potassium: 3.4 mmol/L — ABNORMAL LOW (ref 3.5–5.1)
Sodium: 152 mmol/L — ABNORMAL HIGH (ref 135–145)

## 2014-05-28 LAB — URINE MICROSCOPIC-ADD ON

## 2014-05-28 MED ORDER — INSULIN ASPART 100 UNIT/ML ~~LOC~~ SOLN
0.0000 [IU] | SUBCUTANEOUS | Status: DC
  Administered 2014-05-28 – 2014-05-31 (×4): 1 [IU] via SUBCUTANEOUS

## 2014-05-28 MED ORDER — HEPARIN SODIUM (PORCINE) 5000 UNIT/ML IJ SOLN
5000.0000 [IU] | Freq: Three times a day (TID) | INTRAMUSCULAR | Status: DC
  Administered 2014-05-28 – 2014-05-31 (×8): 5000 [IU] via SUBCUTANEOUS
  Filled 2014-05-28 (×13): qty 1

## 2014-05-28 MED ORDER — LEVOTHYROXINE SODIUM 75 MCG PO TABS
75.0000 ug | ORAL_TABLET | Freq: Every day | ORAL | Status: DC
  Filled 2014-05-28: qty 1

## 2014-05-28 MED ORDER — SODIUM CHLORIDE 0.9 % IV SOLN
1.0000 g | Freq: Four times a day (QID) | INTRAVENOUS | Status: AC
  Administered 2014-05-28 – 2014-05-30 (×8): 1 g via INTRAVENOUS
  Filled 2014-05-28 (×10): qty 1000

## 2014-05-28 MED ORDER — SODIUM CHLORIDE 0.9 % IV BOLUS (SEPSIS)
500.0000 mL | Freq: Once | INTRAVENOUS | Status: AC
  Administered 2014-05-28: 500 mL via INTRAVENOUS

## 2014-05-28 MED ORDER — METOPROLOL TARTRATE 1 MG/ML IV SOLN: 5.0000 mg | INTRAVENOUS | Status: DC | PRN

## 2014-05-28 MED ORDER — ONDANSETRON HCL 4 MG/2ML IJ SOLN: 4.0000 mg | Freq: Four times a day (QID) | INTRAMUSCULAR | Status: DC | PRN

## 2014-05-28 MED ORDER — ONDANSETRON HCL 4 MG PO TABS: 4.0000 mg | ORAL_TABLET | Freq: Four times a day (QID) | ORAL | Status: DC | PRN

## 2014-05-28 MED ORDER — SODIUM CHLORIDE 0.9 % IV SOLN
INTRAVENOUS | Status: DC
  Administered 2014-05-28 – 2014-05-29 (×2): via INTRAVENOUS

## 2014-05-28 MED ORDER — BACID PO TABS: 1.0000 | ORAL_TABLET | Freq: Two times a day (BID) | ORAL | Status: DC

## 2014-05-28 MED ORDER — NITROFURANTOIN MONOHYD MACRO 100 MG PO CAPS: 100.0000 mg | ORAL_CAPSULE | Freq: Two times a day (BID) | ORAL | Status: DC

## 2014-05-28 MED ORDER — ASPIRIN 300 MG RE SUPP
300.0000 mg | Freq: Every day | RECTAL | Status: DC
  Administered 2014-05-28 – 2014-05-31 (×4): 300 mg via RECTAL
  Filled 2014-05-28 (×4): qty 1

## 2014-05-28 MED ORDER — LEVOTHYROXINE SODIUM 100 MCG IV SOLR
37.5000 ug | Freq: Every day | INTRAVENOUS | Status: DC
  Administered 2014-05-29 – 2014-05-31 (×3): 37.5 ug via INTRAVENOUS
  Filled 2014-05-28 (×3): qty 5

## 2014-05-28 MED ORDER — MAGNESIUM SULFATE 2 GM/50ML IV SOLN
2.0000 g | Freq: Once | INTRAVENOUS | Status: AC
  Administered 2014-05-28: 2 g via INTRAVENOUS
  Filled 2014-05-28: qty 50

## 2014-05-28 MED ORDER — SODIUM CHLORIDE 0.9 % IV SOLN
1.0000 g | INTRAVENOUS | Status: AC
  Administered 2014-05-28: 1 g via INTRAVENOUS
  Filled 2014-05-28: qty 1000

## 2014-05-28 MED ORDER — METOPROLOL TARTRATE 25 MG PO TABS: 50.0000 mg | ORAL_TABLET | Freq: Two times a day (BID) | ORAL | Status: DC

## 2014-05-28 MED ORDER — POTASSIUM CHLORIDE 10 MEQ/100ML IV SOLN
10.0000 meq | INTRAVENOUS | Status: AC
  Administered 2014-05-28 (×4): 10 meq via INTRAVENOUS
  Filled 2014-05-28 (×2): qty 100

## 2014-05-28 MED ORDER — LORAZEPAM 2 MG/ML IJ SOLN
0.2500 mg | Freq: Four times a day (QID) | INTRAMUSCULAR | Status: DC | PRN
  Administered 2014-05-28 – 2014-05-29 (×2): 0.25 mg via INTRAVENOUS
  Filled 2014-05-28 (×2): qty 1

## 2014-05-28 MED ORDER — ACETAMINOPHEN 325 MG PO TABS: 650.0000 mg | ORAL_TABLET | Freq: Four times a day (QID) | ORAL | Status: DC | PRN

## 2014-05-28 MED ORDER — ACETAMINOPHEN 650 MG RE SUPP
650.0000 mg | Freq: Four times a day (QID) | RECTAL | Status: DC | PRN
  Administered 2014-05-29 – 2014-05-31 (×5): 650 mg via RECTAL
  Filled 2014-05-28 (×5): qty 1

## 2014-05-28 MED ORDER — LABETALOL HCL 5 MG/ML IV SOLN
10.0000 mg | INTRAVENOUS | Status: DC | PRN
  Administered 2014-05-28 – 2014-05-30 (×10): 10 mg via INTRAVENOUS
  Filled 2014-05-28 (×10): qty 4

## 2014-05-28 MED ORDER — POTASSIUM CHLORIDE 10 MEQ/100ML IV SOLN
10.0000 meq | Freq: Once | INTRAVENOUS | Status: AC
  Administered 2014-05-28: 10 meq via INTRAVENOUS
  Filled 2014-05-28: qty 100

## 2014-05-28 MED ORDER — METOPROLOL TARTRATE 1 MG/ML IV SOLN
5.0000 mg | Freq: Four times a day (QID) | INTRAVENOUS | Status: DC
  Administered 2014-05-28 – 2014-05-31 (×12): 5 mg via INTRAVENOUS
  Filled 2014-05-28 (×16): qty 5

## 2014-05-28 NOTE — Progress Notes (Signed)
ANTIBIOTIC CONSULT NOTE - INITIAL  Pharmacy Consult for Ampicillin Indication: UTI  Allergies  Allergen Reactions  . Amoxicillin     Unknown reaction - Per MAR   . Aricept [Donepezil Hydrochloride] Other (See Comments)    Delusions  . Demerol [Meperidine]     Unknown reaction - Per MAR   . Depakote [Divalproex Dawson stated they suspect Pt is having an allergic reaction to med  . Hydrocodone-Acetaminophen     Unknown reaction - Per MAR   . Metoprolol Other (See Comments)    Dizzy   . Nitrofurantoin     Unknown reaction   . Oxycodone     Unknown reaction - Per MAR   . Sulfonamide Derivatives     Unknown reaction - Per MAR     Patient Measurements: Height: 5\' 1"  (154.9 cm) Weight: 90 lb (40.824 kg) IBW/kg (Calculated) : 47.8  Vital Signs: Temp Source: Rectal (03/28 1146) BP: 178/118 mmHg (03/28 1146) Pulse Rate: 108 (03/28 1146) Intake/Output from previous day:    Labs:  Recent Labs  05/28/14 1220 05/28/14 1231  WBC 10.5  --   HGB 14.7 16.7*  PLT 253  --   CREATININE 0.63 0.60   Estimated Creatinine Clearance: 31.9 mL/min (by C-G formula based on Cr of 0.6). No results for input(s): VANCOTROUGH, VANCOPEAK, VANCORANDOM, GENTTROUGH, GENTPEAK, GENTRANDOM, TOBRATROUGH, TOBRAPEAK, TOBRARND, AMIKACINPEAK, AMIKACINTROU, AMIKACIN in the last 72 hours.   Microbiology: No results found for this or any previous visit (from the past 720 hour(s)).  Medical History: Past Medical History  Diagnosis Date  . Hypertension   . Diabetes mellitus   . Hypothyroidism   . Vertigo   . Anemia   . Osteoarthritis   . GERD (gastroesophageal reflux disease)   . Diverticulosis   . Depression   . H/O: hysterectomy     at age 81  . COPD (chronic obstructive pulmonary disease)   . History of DVT (deep vein thrombosis)   . Anxiety   . Dyspnea   . Enlarged heart   . Fibromyalgia   . Degenerative joint disease   . Degenerative disk disease   . Tremor   . Falls    . Iron deficiency anemia, unspecified 07/15/2012  . CHF (congestive heart failure)   . Cancer     basal cell  . Atrial fibrillation 08/21/2013  . HIATAL HERNIA WITH REFLUX 10/13/2006    Qualifier: Diagnosis of  By: Silvio Pate MD, [facility]   . COPD with chronic bronchitis 10/13/2006    Never a primary smoker, but secondhand smoke from husband by history. Emphysema and chronic bronchitis, mild.    . Senile dementia, uncomplicated 3/97/6734    Qualifier: Diagnosis of  By: Copland MD, Frederico Hamman    . VERTIGO 10/02/2008    Qualifier: Diagnosis of  By: Lorelei Pont MD, Frederico Hamman    . TREMOR, ESSENTIAL 10/13/2006    Qualifier: Diagnosis of  By: Silvio Pate MD, [facility]   . POSITIVE PPD 06/02/2007    Qualifier: Diagnosis of  By: Julien Girt CMA, Marliss Czar    . COLONIC POLYPS, HX OF 10/13/2006    Qualifier: Diagnosis of  By: Silvio Pate MD, [facility]     Assessment: 81 yoF brought to ED via EMS from nursing facility on 3/28 with lethargy, recent UTI, and FTT.She was started on Macrobid PTA on 3/23.Pharmacy is consulted to dose Ampicillin for UTI with planned stop date 3/30 to complete 7 day course. Noted amoxicillin allergy (unknown rxn).  [facility]  nursing staff reports pt developing AMS near the start time of depakote.  Both are now listed as potential allergies.  No reported breathing complications or rash.  3/28 >> Ampicillin >> (3/30)  Today, 05/28/2014:  Tmax: 99  WBCs: 10.5  Renal: SCr 0.6, CrCl ~ 40 ml/min   Goal of Therapy:  Appropriate abx dosing, eradication of infection.   Plan:   Ampicillin 1g IV q6h  Follow up renal fxn, culture results, and clinical course.   Gretta Arab PharmD, BCPS Pager [phone] 05/28/2014 2:10 PM

## 2014-05-28 NOTE — ED Notes (Signed)
Per EMS- recently treated for UTI-since treatment started patient has gotten "worse and worse." Hx dementia. Increasing lethargy with less activity and less verbal. VS: BP 160/96 HR 104 RR 18. Resides at [place] at [facility].

## 2014-05-28 NOTE — ED Provider Notes (Signed)
CSN: [id number]     Arrival date & time 05/28/14  1140 History   First MD Initiated Contact with Patient 05/28/14 1201     Chief Complaint  Patient presents with  . Failure To Thrive  . Recent UTI      (Consider location/radiation/quality/duration/timing/severity/associated sxs/prior Treatment) Patient is a 81 y.o. female presenting with weakness. The history is provided by the nursing home (pt has not been eating or drinking for 2 days and has become more lethargic).  Weakness This is a recurrent problem. The current episode started more than 2 days ago. The problem occurs constantly. The problem has not changed since onset.Pertinent negatives include no chest pain. Nothing aggravates the symptoms. Nothing relieves the symptoms.    Past Medical History  Diagnosis Date  . Hypertension   . Diabetes mellitus   . Hypothyroidism   . Vertigo   . Anemia   . Osteoarthritis   . GERD (gastroesophageal reflux disease)   . Diverticulosis   . Depression   . H/O: hysterectomy     at age 70  . COPD (chronic obstructive pulmonary disease)   . History of DVT (deep vein thrombosis)   . Anxiety   . Dyspnea   . Enlarged heart   . Fibromyalgia   . Degenerative joint disease   . Degenerative disk disease   . Tremor   . Falls   . Iron deficiency anemia, unspecified 07/15/2012  . CHF (congestive heart failure)   . Cancer     basal cell  . Atrial fibrillation 08/21/2013  . HIATAL HERNIA WITH REFLUX 10/13/2006    Qualifier: Diagnosis of  By: Silvio Pate MD, [facility]   . COPD with chronic bronchitis 10/13/2006    Never a primary smoker, but secondhand smoke from husband by history. Emphysema and chronic bronchitis, mild.    . Senile dementia, uncomplicated 0/17/7939    Qualifier: Diagnosis of  By: Copland MD, Frederico Hamman    . VERTIGO 10/02/2008    Qualifier: Diagnosis of  By: Lorelei Pont MD, Frederico Hamman    . TREMOR, ESSENTIAL 10/13/2006    Qualifier: Diagnosis of  By: Silvio Pate MD, [facility]aird Cancer   . POSITIVE PPD  06/02/2007    Qualifier: Diagnosis of  By: Julien Girt CMA, Marliss Czar    . COLONIC POLYPS, HX OF 10/13/2006    Qualifier: Diagnosis of  By: Silvio Pate MD, Baird Cancer    Past Surgical History  Procedure Laterality Date  . Cholecystectomy  1944  . Appendectomy  1952  . Ovarian cyst removal  1982    secondary ventral hematoma  . Bladder tack  1997  . Cataract extraction, bilateral    . Kyphosis surgery    . Total abdominal hysterectomy    . Salpingoophorectomy      unilateral salpingo-oophorectomy  . Orif right hip     Family History  Problem Relation Age of Onset  . Stroke Mother   . Cancer Father     Pancreatic Cancer  . Kidney disease Sister   . Heart disease Brother   . Stroke Brother   . Heart disease Brother    History  Substance Use Topics  . Smoking status: Never Smoker   . Smokeless tobacco: Never Used  . Alcohol Use: No   OB History    No data available     Review of Systems  Unable to perform ROS: Dementia  Cardiovascular: Negative for chest pain.  Neurological: Positive for weakness.      Allergies  Amoxicillin; Aricept; Demerol; Depakote;  Hydrocodone-acetaminophen; Metoprolol; Nitrofurantoin; Oxycodone; and Sulfonamide derivatives  Home Medications   Prior to Admission medications   Medication Sig Start Date End Date Taking? Authorizing Provider  acetaminophen (TYLENOL) 325 MG tablet Take 650 mg by mouth every 4 (four) hours as needed for mild pain.   Yes Historical Provider, MD  aspirin 81 MG chewable tablet Chew 81 mg by mouth daily.   Yes Historical Provider, MD  clorazepate (TRANXENE) 3.75 MG tablet Take 1 tablet (3.75 mg total) by mouth at bedtime. 10/30/13  Yes Tiffany L Reed, DO  famotidine (PEPCID) 20 MG tablet Take 20 mg by mouth 2 (two) times daily.   Yes Historical Provider, MD  feeding supplement, ENSURE COMPLETE, (ENSURE COMPLETE) LIQD Take 237 mLs by mouth 2 (two) times daily between meals. 03/28/14  Yes Rushil Sherrye Payor, MD  furosemide (LASIX) 20 MG  tablet Take 1 tablet (20 mg total) by mouth 2 (two) times daily as needed. For weight gain of 4 pounds or more daily 01/19/14  Yes Minna Merritts, MD  hydrOXYzine (ATARAX/VISTARIL) 10 MG tablet Take 10 mg by mouth every 8 (eight) hours as needed for itching (or rash).    Yes Historical Provider, MD  lactobacillus acidophilus (BACID) TABS tablet Take 1 tablet by mouth 2 (two) times daily. For 7 days 05/23/14  Yes Historical Provider, MD  levothyroxine (SYNTHROID, LEVOTHROID) 75 MCG tablet Take 75 mcg by mouth daily.     Yes Historical Provider, MD  magnesium oxide (MAG-OX) 400 MG tablet Take 400 mg by mouth 2 (two) times daily.    Yes Historical Provider, MD  metFORMIN (GLUCOPHAGE) 500 MG tablet Take 500 mg by mouth 2 (two) times daily with a meal.   Yes Historical Provider, MD  metoprolol (LOPRESSOR) 50 MG tablet Take 50 mg by mouth 2 (two) times daily.   Yes Historical Provider, MD  Multiple Vitamin (MULTIVITAMIN PO) Take 1 tablet by mouth daily.     Yes Historical Provider, MD  nitrofurantoin, macrocrystal-monohydrate, (MACROBID) 100 MG capsule Take 100 mg by mouth 2 (two) times daily. For 7 days 05/23/14  Yes Historical Provider, MD  potassium chloride (K-DUR) 10 MEQ tablet Take 1 tablet (10 mEq total) by mouth 2 (two) times daily as needed. To take with lasix Patient taking differently: Take 10 mEq by mouth 2 (two) times daily as needed (for weight gain). To take with lasix 01/19/14  Yes Minna Merritts, MD  sennosides-docusate sodium (SENOKOT-S) 8.6-50 MG tablet Take 1-2 tablets by mouth 2 (two) times daily. Takes 1 tablet in the morning and 2 tablets at bedtime   Yes Historical Provider, MD  sertraline (ZOLOFT) 25 MG tablet Take 12.5 mg by mouth daily. Takes with 50mg  to make a total of 62.5mg  everyday   Yes Historical Provider, MD  sertraline (ZOLOFT) 50 MG tablet Take 50 mg by mouth daily. Takes with 12.5mg  to make a total of 62.5mg  everyday   Yes Historical Provider, MD  Vitamin D,  Ergocalciferol, (DRISDOL) 50000 UNITS CAPS capsule Take 50,000 Units by mouth every Tuesday.    Yes Historical Provider, MD  risperiDONE (RISPERDAL) 0.25 MG tablet Take 1 tablet in early evening [6-7pm]. Patient not taking: Reported on 05/28/2014 03/28/14   Riccardo Dubin, MD  tapentadol (NUCYNTA) 50 MG TABS tablet Take one tablet by mouth every 6 hours as needed for severe pain Patient not taking: Reported on 05/28/2014 11/14/13   Blanchie Serve, MD  tiotropium (SPIRIVA) 18 MCG inhalation capsule Place 1 capsule (18 mcg total)  into inhaler and inhale daily. Patient not taking: Reported on 05/28/2014 10/12/13   Deneise Lever, MD   BP 178/118 mmHg  Pulse 108  Resp 18  SpO2 95% Physical Exam  Constitutional:  cachetic   HENT:  Head: Normocephalic.  Eyes: Conjunctivae and EOM are normal. No scleral icterus.  Neck: Neck supple. No thyromegaly present.  Cardiovascular: Normal rate.  Exam reveals no gallop and no friction rub.   No murmur heard. Irregular rate  Pulmonary/Chest: No stridor. She has no wheezes. She has no rales. She exhibits no tenderness.  Abdominal: She exhibits no distension. There is no tenderness. There is no rebound.  Musculoskeletal: She exhibits no edema.  Lymphadenopathy:    She has no cervical adenopathy.  Neurological: She exhibits normal muscle tone. Coordination normal.  Pt alert,  But only responding to painful stimili  Skin: No rash noted. No erythema.    ED Course  Procedures (including critical care time) Labs Review Labs Reviewed  CBC WITH DIFFERENTIAL/PLATELET - Abnormal; Notable for the following:    RBC 5.61 (*)    RDW 19.4 (*)    Neutro Abs 7.8 (*)    All other components within normal limits  URINALYSIS, ROUTINE W REFLEX MICROSCOPIC - Abnormal; Notable for the following:    Color, Urine AMBER (*)    APPearance TURBID (*)    Ketones, ur 15 (*)    Protein, ur >300 (*)    Leukocytes, UA SMALL (*)    All other components within normal limits   URINE MICROSCOPIC-ADD ON - Abnormal; Notable for the following:    Bacteria, UA MANY (*)    All other components within normal limits  I-STAT CHEM 8, ED - Abnormal; Notable for the following:    Sodium 154 (*)    Potassium 2.8 (*)    BUN 51 (*)    Glucose, Bld 187 (*)    Calcium, Ion 1.32 (*)    Hemoglobin 16.7 (*)    HCT 49.0 (*)    All other components within normal limits  COMPREHENSIVE METABOLIC PANEL    Imaging Review No results found.   EKG Interpretation   Date/Time:  Monday May 28 2014 13:14:29 EDT Ventricular Rate:  106 PR Interval:    QRS Duration: 108 QT Interval:  394 QTC Calculation: 523 R Axis:   -87 Text Interpretation:  Atrial fibrillation Paired ventricular premature  complexes Abnormal lateral Q waves Probable anterior infarct, age  indeterminate Prolonged QT interval Confirmed by Avilene Marrin  MD, Jenina Moening  [id number]) on 05/28/2014 1:16:27 PM      MDM   Final diagnoses:  Dehydration  Hypokalemia    Pt is a DNR.   She will be admitted for hydration and tx of hypokalemia   Milton Ferguson, MD 05/28/14 1320

## 2014-05-28 NOTE — H&P (Signed)
[facility] History and Physical  [name] MHD:[id number] DOB: 1925-02-26 DOA: 05/28/2014   PCP: [name] Pink, [name]    Chief Complaint: weak, not eating or drinking  HPI: [name] is a 81 y.o. female with Parkinson's disease with Lewy body dementia, CHF unspecified, A-fib, HTN, Hypothyriod, DM who resided in a Nursing home and is not able to give a history. History obtained from [name] who states that the patient has not eaten or drank in 2 days and is severely weak. She was recently started on treatment for a UTI with Nitrofurantoin. According to [place], she has frequent UTIs.  The patient is moaning and is not answering questions.  She was admitted on 03/20/14 and discharged on 03/28/14 by teaching service for "altered mental status", urinary retention, Pneumonia.   Unable to obtain ROS   Past Medical History  Diagnosis Date  . Hypertension   . Diabetes mellitus   . Hypothyroidism   . Vertigo   . Anemia   . Osteoarthritis   . GERD (gastroesophageal reflux disease)   . Diverticulosis   . Depression   . H/O: hysterectomy     at age 37  . COPD (chronic obstructive pulmonary disease)   . History of DVT (deep vein thrombosis)   . Anxiety   . Dyspnea   . Enlarged heart   . Fibromyalgia   . Degenerative joint disease   . Degenerative disk disease   . Tremor   . Falls   . Iron deficiency anemia, unspecified 07/15/2012  . CHF (congestive heart failure)   . [name]     basal cell  . Atrial fibrillation 08/21/2013  . HIATAL HERNIA WITH REFLUX 10/13/2006    Qualifier: Diagnosis of  By: [name], [name]   . COPD with chronic bronchitis 10/13/2006    Never a primary smoker, but secondhand smoke from husband by history. Emphysema and chronic bronchitis, mild.    . Senile dementia, uncomplicated 04/13/9415    Qualifier: Diagnosis of  By: Copland [name], [name]    . VERTIGO 10/02/2008    Qualifier: Diagnosis of  By: [name] Pont [name], [name]    . TREMOR, ESSENTIAL 10/13/2006     Qualifier: Diagnosis of  By: [name], [name]   . POSITIVE PPD 06/02/2007    Qualifier: Diagnosis of  By: [name] CMA, [name]    . COLONIC POLYPS, HX OF 10/13/2006    Qualifier: Diagnosis of  By: [name], [name]     Past Surgical History  Procedure Laterality Date  . Cholecystectomy  1944  . Appendectomy  1952  . Ovarian cyst removal  1982    secondary ventral hematoma  . Bladder tack  1997  . Cataract extraction, bilateral    . Kyphosis surgery    . Total abdominal hysterectomy    . Salpingoophorectomy      unilateral salpingo-oophorectomy  . Orif right hip      Social History:cannot obtain Lives at nursing home    Allergies  Allergen Reactions  . Amoxicillin     Unknown reaction - Per MAR   . Aricept [Donepezil Hydrochloride] Other (See Comments)    Delusions  . Demerol [Meperidine]     Unknown reaction - Per MAR   . Depakote [Divalproex [place] stated they suspect Pt is having an allergic reaction to med  . Hydrocodone-Acetaminophen     Unknown reaction - Per MAR   . Metoprolol Other (See Comments)  Dizzy   . Nitrofurantoin     Unknown reaction   . Oxycodone     Unknown reaction - Per MAR   . Sulfonamide Derivatives     Unknown reaction - Per MAR     Family History  Problem Relation Age of Onset  . Stroke Mother   . [name] Father     Pancreatic [name]  . Kidney disease Sister   . Heart disease Brother   . Stroke Brother   . Heart disease Brother      Prior to Admission medications   Medication Sig Start Date End Date Taking? Authorizing Provider  acetaminophen (TYLENOL) 325 MG tablet Take 650 mg by mouth every 4 (four) hours as needed for mild pain.   Yes Historical Provider, [name]  aspirin 81 MG chewable tablet Chew 81 mg by mouth daily.   Yes Historical Provider, [name]  clorazepate (TRANXENE) 3.75 MG tablet Take 1 tablet (3.75 mg total) by mouth at bedtime. 10/30/13  Yes [name] L Reed, DO  famotidine (PEPCID) 20 MG tablet Take  20 mg by mouth 2 (two) times daily.   Yes Historical Provider, [name]  feeding supplement, ENSURE COMPLETE, (ENSURE COMPLETE) LIQD Take 237 mLs by mouth 2 (two) times daily between meals. 03/28/14  Yes [name] Sherrye Payor, [name]  furosemide (LASIX) 20 MG tablet Take 1 tablet (20 mg total) by mouth 2 (two) times daily as needed. For weight gain of 4 pounds or more daily 01/19/14  Yes [name] Merritts, [name]  hydrOXYzine (ATARAX/VISTARIL) 10 MG tablet Take 10 mg by mouth every 8 (eight) hours as needed for itching (or rash).    Yes Historical Provider, [name]  lactobacillus acidophilus (BACID) TABS tablet Take 1 tablet by mouth 2 (two) times daily. For 7 days 05/23/14  Yes Historical Provider, [name]  levothyroxine (SYNTHROID, LEVOTHROID) 75 MCG tablet Take 75 mcg by mouth daily.     Yes Historical Provider, [name]  magnesium oxide (MAG-OX) 400 MG tablet Take 400 mg by mouth 2 (two) times daily.    Yes Historical Provider, [name]  metFORMIN (GLUCOPHAGE) 500 MG tablet Take 500 mg by mouth 2 (two) times daily with a meal.   Yes Historical Provider, [name]  metoprolol (LOPRESSOR) 50 MG tablet Take 50 mg by mouth 2 (two) times daily.   Yes Historical Provider, [name]  Multiple Vitamin (MULTIVITAMIN PO) Take 1 tablet by mouth daily.     Yes Historical Provider, [name]  nitrofurantoin, macrocrystal-monohydrate, (MACROBID) 100 MG capsule Take 100 mg by mouth 2 (two) times daily. For 7 days 05/23/14  Yes Historical Provider, [name]  potassium chloride (K-DUR) 10 MEQ tablet Take 1 tablet (10 mEq total) by mouth 2 (two) times daily as needed. To take with lasix Patient taking differently: Take 10 mEq by mouth 2 (two) times daily as needed (for weight gain). To take with lasix 01/19/14  Yes [name] Merritts, [name]  sennosides-docusate sodium (SENOKOT-S) 8.6-50 MG tablet Take 1-2 tablets by mouth 2 (two) times daily. Takes 1 tablet in the morning and 2 tablets at bedtime   Yes Historical Provider, [name]  sertraline (ZOLOFT) 25 MG tablet Take 12.5 mg by mouth daily. Takes  with 50mg  to make a total of 62.5mg  everyday   Yes Historical Provider, [name]  sertraline (ZOLOFT) 50 MG tablet Take 50 mg by mouth daily. Takes with 12.5mg  to make a total of 62.5mg  everyday   Yes Historical Provider, [name]  Vitamin D, Ergocalciferol, (DRISDOL) 50000 UNITS CAPS capsule Take 50,000 Units by mouth  every Tuesday.    Yes Historical Provider, [name]     Physical Exam: Filed Vitals:   05/28/14 1146 05/28/14 1330 05/28/14 1400  BP: 178/118 200/97 192/107  Pulse: 108 69 70  TempSrc: Rectal    Resp: 18 26 24   SpO2: 95% 96% 96%     General: moaning constantly- not otherwise communicative- will not answer questions - looking straight ahead- will not look at examiner HEENT: Normocephalic and Atraumatic, Mucous membranes dry                PERRLA; EOM intact; No scleral icterus,                 Nares: Patent, Oropharynx: Clear, Fair Dentition                 Neck: FROM, no cervical lymphadenopathy, thyromegaly, carotid bruit or JVD;  Breasts: deferred CHEST WALL: No tenderness  CHEST: Normal respiration, clear to auscultation bilaterally  HEART: IIRR- rate going from 105 to 120s; no murmurs rubs or gallops  BACK: No kyphosis or scoliosis; no CVA tenderness  GI: Positive Bowel Sounds, soft, non-tender; no masses, no organomegaly Rectal Exam: deferred MSK: No cyanosis, clubbing, or edema Genitalia: not examined  SKIN:  no rash or ulceration  CNS: confused, Nonfocal exam, CN 2-12 intact  Labs on Admission:  Basic Metabolic Panel:  Recent Labs Lab 05/28/14 1220 05/28/14 1231  NA 154* 154*  K 2.9* 2.8*  CL 114* 112  CO2 28  --   GLUCOSE 187* 187*  BUN 58* 51*  CREATININE 0.63 0.60  CALCIUM 10.3  --    Liver Function Tests:  Recent Labs Lab 05/28/14 1220  AST 39*  ALT 28  ALKPHOS 112  BILITOT 2.2*  PROT 7.2  ALBUMIN 3.3*   No results for input(s): LIPASE, AMYLASE in the last 168 hours. No results for input(s): AMMONIA in the last 168 hours. CBC:  Recent  Labs Lab 05/28/14 1220 05/28/14 1231  WBC 10.5  --   NEUTROABS 7.8*  --   HGB 14.7 16.7*  HCT 45.9 49.0*  MCV 81.8  --   PLT 253  --    Cardiac Enzymes: No results for input(s): CKTOTAL, CKMB, CKMBINDEX, TROPONINI in the last 168 hours.  BNP (last 3 results) No results for input(s): BNP in the last 8760 hours.  ProBNP (last 3 results) No results for input(s): PROBNP in the last 8760 hours.  CBG: No results for input(s): GLUCAP in the last 168 hours.  Radiological Exams on Admission: No results found.  EKG: Independently reviewed. A-fib rate 106- Qtc 523  Assessment/Plan Principal Problem: Dehydration / hypernatremia - based on BUN/ Cr ratio and exam- only 100 cc of dark urine obtained when RN did I and O cath for UA - will receive 1 L bolus in [name] and then continue NS at 125cc/hr which is an adequate amount for her small frame - follow I and O - have asked for foley as we need accurate I and O  Active Problems:   Atrial fibrillation - possibly made worse by dehydration - will give fluid as mentioned above- will need to hold oral metoprolol and start IV Lopressor - not on anticoagulation other than ASA which I have switched to a suppository for now - checking TFTs as well  UTI - was sensitive to Ampicillin per culture results- will start- Nitrofurantion was started at [facility] on 3/23- stop date for a 7 day course will be 3/30-- remove Foley  as soon as able  HTN urgency - starting Lopressor IV- PRN Labetalol ordered     Hypothyroidism - resume synthroid via IV and check TFTs    Anxiety state - holding Clorazepate- PRN Ativan at a small dose    CHF, chronic? - follow for fluid overload- last ECHO in 2011 revealed normal EF and no mention of diastolic dysfunction which she may have if she has chronic HTN    Hypokalemia - replacing via IV- will give 2 g of Mg as well in case Mg low- will recheck K and Mg this evening    DM type 2, goal A1C 7-8 - will place on low  dose sliding scale Q4 hrs and hold Metformin  Acute encephalopathy? - not sure of her baseline but she was sent because she is worse than her baseline- NPO for now until more alert to prevent aspiration -   Consulted: none- may need to consider palliative consult - this can be done when she gets back to nursing facility by her PCP as well  Code Status: DNRGirtha Rm DNR sent with her  Family Communication:   DVT Prophylaxis:Heparin  Time spent: 52 min  [place], [name] [facility]  If 7PM-7AM, please contact night-coverage [email or web address] 05/28/2014, 2:38 PM

## 2014-05-28 NOTE — ED Notes (Signed)
Bed: WA21 Expected date:  Expected time:  Means of arrival:  Comments: Ems- elderly, recent UTI

## 2014-05-29 ENCOUNTER — Inpatient Hospital Stay (HOSPITAL_COMMUNITY): Payer: Medicare PPO

## 2014-05-29 DIAGNOSIS — G9340 Encephalopathy, unspecified: Secondary | ICD-10-CM

## 2014-05-29 LAB — CBC
HCT: 42.1 % (ref 36.0–46.0)
Hemoglobin: 13.3 g/dL (ref 12.0–15.0)
MCH: 25.9 pg — ABNORMAL LOW (ref 26.0–34.0)
MCHC: 31.6 g/dL (ref 30.0–36.0)
MCV: 82.1 fL (ref 78.0–100.0)
Platelets: 218 10*3/uL (ref 150–400)
RBC: 5.13 MIL/uL — AB (ref 3.87–5.11)
RDW: 19.3 % — AB (ref 11.5–15.5)
WBC: 10.6 10*3/uL — ABNORMAL HIGH (ref 4.0–10.5)

## 2014-05-29 LAB — GLUCOSE, CAPILLARY
GLUCOSE-CAPILLARY: 116 mg/dL — AB (ref 70–99)
GLUCOSE-CAPILLARY: 129 mg/dL — AB (ref 70–99)
GLUCOSE-CAPILLARY: 101 mg/dL — AB (ref 70–99)
Glucose-Capillary: 119 mg/dL — ABNORMAL HIGH (ref 70–99)
Glucose-Capillary: 111 mg/dL — ABNORMAL HIGH (ref 70–99)
Glucose-Capillary: 94 mg/dL (ref 70–99)

## 2014-05-29 LAB — BASIC METABOLIC PANEL
Anion gap: 9 (ref 5–15)
BUN: 47 mg/dL — ABNORMAL HIGH (ref 6–23)
CALCIUM: 9.4 mg/dL (ref 8.4–10.5)
CO2: 23 mmol/L (ref 19–32)
Chloride: 120 mmol/L — ABNORMAL HIGH (ref 96–112)
Creatinine, Ser: 0.49 mg/dL — ABNORMAL LOW (ref 0.50–1.10)
GFR calc Af Amer: >90 mL/min (ref >90)
GFR calc non Af Amer: 85 mL/min — ABNORMAL LOW (ref >90)
Glucose, Bld: 126 mg/dL — ABNORMAL HIGH (ref 70–99)
Potassium: 3.3 mmol/L — ABNORMAL LOW (ref 3.5–5.1)
Sodium: 152 mmol/L — ABNORMAL HIGH (ref 135–145)

## 2014-05-29 LAB — T4, FREE: Free T4: 1 ng/dL (ref 0.80–1.80)

## 2014-05-29 LAB — TSH: TSH: 3.534 u[IU]/mL (ref 0.350–4.500)

## 2014-05-29 MED ORDER — SODIUM CHLORIDE 0.45 % IV SOLN
INTRAVENOUS | Status: DC
  Administered 2014-05-29 – 2014-05-30 (×2): via INTRAVENOUS

## 2014-05-29 MED ORDER — POTASSIUM CHLORIDE 10 MEQ/100ML IV SOLN
10.0000 meq | INTRAVENOUS | Status: AC
  Administered 2014-05-29 (×4): 10 meq via INTRAVENOUS
  Filled 2014-05-29 (×4): qty 100

## 2014-05-29 NOTE — Progress Notes (Signed)
CARE MANAGEMENT NOTE 05/29/2014  Patient:  [name], [name]   Account Number:  [id number]  Date Initiated:  05/29/2014  Documentation initiated by:  Jerlean Peralta  Subjective/Objective Assessment:   hypertensive crisis     Action/Plan:   home when stable   Anticipated DC Date:  06/01/2014   Anticipated DC Plan:  HOME/SELF CARE  In-house referral  NA      DC Planning Services  CM consult      Choice offered to / List presented to:             Status of service:  In process, will continue to follow Medicare Important Message given?   (If response is "NO", the following Medicare IM given date fields will be blank) Date Medicare IM given:   Medicare IM given by:   Date Additional Medicare IM given:   Additional Medicare IM given by:    Discharge Disposition:    Per UR Regulation:  Reviewed for med. necessity/level of care/duration of stay  If discussed at [place] of Stay Meetings, dates discussed:    Comments:  May 29, 2014/Alfredo Spong L. Rosana Hoes, RN, BSN, CCM. Case Management [place] [phone] No discharge needs present of time of review.

## 2014-05-29 NOTE — Clinical Documentation Improvement (Signed)
Please specify diagnosis related to below supporting information, if appropriate.   Possible Clinical Conditions?  Chronic Systolic Congestive Heart Failure Chronic Diastolic Congestive Heart Failure Chronic Systolic & Diastolic Congestive Heart Failure Acute Systolic Congestive Heart Failure Acute Diastolic Congestive Heart Failure Acute Systolic & Diastolic Congestive Heart Failure Acute on Chronic Systolic Congestive Heart Failure Acute on Chronic Diastolic Congestive Heart Failure Acute on Chronic Systolic & Diastolic Congestive Heart Failure Other Condition________________________________________ Cannot Clinically Determine  Supporting Information:   PROGRESS 05/28/2014   Medical History:  CHF (congestive heart failure)   H&P 05/28/2014   HPI:  [name] is a 81 y.o. female with Parkinson's disease with Lewy body dementia, CHF unspecified, A-fib, HTN, Hypothyriod, DM who resided in a Nursing home and is not able to give a history.  CHF (congestive heart failure)  Active Problems:   CHF, chronic?  - follow for fluid overload- last ECHO in 2011 revealed normal EF and no mention of diastolic dysfunction which she may have if she has chronic HTN    ED Prov Note 05/28/2014    CHF (congestive heart failure)     Thank You, Serena Colonel ,RN Clinical Documentation Specialist:  [place] Information Management

## 2014-05-29 NOTE — Progress Notes (Addendum)
[facility] Progress Note   [name] WGN:[id number] DOB: [birth year] DOA: 05/28/2014 PCP: Maryland Pink, MD  Brief narrative: [name] is a 81 y.o. female with Parkinson's disease with Lewy body dementia, CHF unspecified, A-fib, HTN, Hypothyriod, DM who resided in a Nursing home who was sent for weakness and having not eaten for 2 days. She was recently started on treatment for a UTI with Nitrofurantoin. According to [place], she has frequent UTIs. She was incoherant in the ER, moaning and not answering questions. At baseline she in non-communicative.    Subjective: Difficult to arouse today. Per RN, the daughter states that she gets very restless when she is awake and will need a sitter. I spoke with her Legal guardian who states that she saw the patient in her normal state about 2 wks ago- at baseline she is usually very forgetful but is otherwise able to feed herself and walk about without assistance.   Assessment/Plan: Principal Problem: Dehydration / hypernatremia - based on BUN/ Cr ratio and exam- only 100 cc of dark urine obtained when RN did I and O cath for UA in ER - given 1 L bolus in ER and then NS at 125cc/hr which is an adequate amount for her small frame- due to ongoing HTN, will change fluids to 1/2 NS - follow I and O - have asked for foley as we need accurate I and O - sodium and cr only mildly improved so far- switch to 1/2NS  Active Problems: Acute encephalopathy - NPO for now until more alert to prevent aspiration - was given a couple of small doses of Ativan last night- will d/c Ativan- get sitter if needed for restlessness but goal is not to keep her sedated - Obtain CT head stat today as she is still not arousing  HTN urgency - starting Lopressor IV- PRN Labetalol ordered - BP mostly in [id number] systolic today   Atrial fibrillation- chronic problem - possibly made worse by dehydration- now in sinus rhythm   - was given fluids as mentioned  above-  holding oral metoprolol and start IV Lopressor - not on anticoagulation other than ASA which I have switched to a suppository for now - TFTs normal  UTI - was sensitive to Ampicillin per culture results- will start- Nitrofurantion was started at SNF on 3/23- stop date for a 7 day course will be 3/30-- remove Foley as soon as able   Hypothyroidism - resume synthroid via IV - TFTs WNL   Anxiety state - holding Clorazepate- no sedatives for now as I want to see if she awakens - will ask for sitter   CHF, chronic? - follow for fluid overload- last ECHO in 2011 revealed normal EF and no mention of diastolic dysfunction which she may have if she has chronic HTN - PO2 is still 96% on room air today   Hypokalemia - replacing via IV- mg level normal but she did receive 2 g in the ER   DM type 2, goal A1C 7-8 - will place on low dose sliding scale Q4 hrs and hold Metformin     Code Status: DNR Family Communication: spoke with legal gaurdian Disposition Plan: follow in SDU DVT prophylaxis: Heparin Consultants: Procedures:  Antibiotics: Anti-infectives    Start     Dose/Rate Route Frequency Ordered Stop   05/29/14 0000  ampicillin (OMNIPEN) 1 g in sodium chloride 0.9 % 50 mL IVPB     1 g 150 mL/hr over 20 Minutes Intravenous Every  6 hours 05/28/14 1533 05/30/14 2359   05/28/14 1500  ampicillin (OMNIPEN) 1 g in sodium chloride 0.9 % 50 mL IVPB     1 g 150 mL/hr over 20 Minutes Intravenous STAT 05/28/14 1448 05/28/14 1636      Objective: Filed Weights   05/28/14 1441 05/29/14 0514  Weight: 40.824 kg (90 lb) 43.1 kg (95 lb 0.3 oz)    Intake/Output Summary (Last 24 hours) at 05/29/14 1245 Last data filed at 05/29/14 1200  Gross per 24 hour  Intake 4018.33 ml  Output    776 ml  Net 3242.33 ml     Vitals Filed Vitals:   05/29/14 0551 05/29/14 0730 05/29/14 0800 05/29/14 0820  BP: 170/79 201/126  159/92  Pulse: 95 92  142  Temp:   98.2 F (36.8 C)    TempSrc:   Oral   Resp: 27 28  28   Height:      Weight:      SpO2: 92% 95%  91%    Exam:  General:  Pt is lethargic,  not in acute distress- frail thin woman  HEENT: No icterus, No thrush  Cardiovascular: regular rate and rhythm, S1/S2 No murmur  Respiratory: clear to auscultation bilaterally   Abdomen: Soft, +Bowel sounds, non tender, non distended, no guarding  MSK: No LE edema, cyanosis or clubbing  Data Reviewed: Basic Metabolic Panel:  Recent Labs Lab 05/28/14 1220 05/28/14 1231 05/28/14 1424 05/29/14 0300  NA 154* 154* 152* 152*  K 2.9* 2.8* 3.4* 3.3*  CL 114* 112 114* 120*  CO2 28  --  25 23  GLUCOSE 187* 187* 123* 126*  BUN 58* 51* 58* 47*  CREATININE 0.63 0.60 0.49* 0.49*  CALCIUM 10.3  --  9.9 9.4  MG  --   --  1.8  --    Liver Function Tests:  Recent Labs Lab 05/28/14 1220  AST 39*  ALT 28  ALKPHOS 112  BILITOT 2.2*  PROT 7.2  ALBUMIN 3.3*   No results for input(s): LIPASE, AMYLASE in the last 168 hours. No results for input(s): AMMONIA in the last 168 hours. CBC:  Recent Labs Lab 05/28/14 1220 05/28/14 1231 05/29/14 0300  WBC 10.5  --  10.6*  NEUTROABS 7.8*  --   --   HGB 14.7 16.7* 13.3  HCT 45.9 49.0* 42.1  MCV 81.8  --  82.1  PLT 253  --  218   Cardiac Enzymes: No results for input(s): CKTOTAL, CKMB, CKMBINDEX, TROPONINI in the last 168 hours. BNP (last 3 results) No results for input(s): BNP in the last 8760 hours.  ProBNP (last 3 results) No results for input(s): PROBNP in the last 8760 hours.  CBG:  Recent Labs Lab 05/28/14 1900 05/28/14 2031 05/28/14 2355 05/29/14 0352 05/29/14 0742  GLUCAP 98 125* 137* 119* 116*    Recent Results (from the past 240 hour(s))  MRSA PCR Screening     Status: None   Collection Time: 05/28/14  4:15 PM  Result Value Ref Range Status   MRSA by PCR NEGATIVE NEGATIVE Final    Comment:        The GeneXpert MRSA Assay (FDA approved for NASAL specimens only), is one component of  a comprehensive MRSA colonization surveillance program. It is not intended to diagnose MRSA infection nor to guide or monitor treatment for MRSA infections.      Studies:  Recent x-ray studies have been reviewed in detail by the Attending Physician  Scheduled Meds:  Scheduled  Meds: . ampicillin (OMNIPEN) IV  1 g Intravenous Q6H  . aspirin  300 mg Rectal Daily  . heparin  5,000 Units Subcutaneous 3 times per day  . insulin aspart  0-9 Units Subcutaneous Q4H  . levothyroxine  37.5 mcg Intravenous Daily  . metoprolol  5 mg Intravenous 4 times per day   Continuous Infusions: . sodium chloride 100 mL/hr at 05/29/14 0532    Time spent on care of this patient: 52 min   [place], MD 05/29/2014, 12:45 PM  LOS: 1 day   [facility] Office  [phone] Pager - Text Page per [email or web address]  If 7PM-7AM, please contact night-coverage [email or web address]

## 2014-05-29 NOTE — Progress Notes (Signed)
CSW received referral that pt admitted from [facility] at [facility].  CSW visited pt room. No family present at bedside. Per RN, pt with dementia and non-communicative at this time. Per MD note, pt non-communicative at baseline.   Per RN,  Pt daughter is from [place] and pt legal guardian lives in [place], but currently out of town in [place], but will return tomorrow.   CSW to continue to follow pt progress and will follow up tomorrow to complete full psychosocial assessment.   Alison Murray, MSW, [facility] [phone]

## 2014-05-30 LAB — GLUCOSE, CAPILLARY
Glucose-Capillary: 102 mg/dL — ABNORMAL HIGH (ref 70–99)
Glucose-Capillary: 106 mg/dL — ABNORMAL HIGH (ref 70–99)
Glucose-Capillary: 106 mg/dL — ABNORMAL HIGH (ref 70–99)
Glucose-Capillary: 112 mg/dL — ABNORMAL HIGH (ref 70–99)
Glucose-Capillary: 112 mg/dL — ABNORMAL HIGH (ref 70–99)
Glucose-Capillary: 122 mg/dL — ABNORMAL HIGH (ref 70–99)

## 2014-05-30 LAB — BASIC METABOLIC PANEL
ANION GAP: 11 (ref 5–15)
BUN: 35 mg/dL — ABNORMAL HIGH (ref 6–23)
CO2: 20 mmol/L (ref 19–32)
CREATININE: 0.64 mg/dL (ref 0.50–1.10)
Calcium: 9.3 mg/dL (ref 8.4–10.5)
Chloride: 117 mmol/L — ABNORMAL HIGH (ref 96–112)
GFR calc Af Amer: >90 mL/min (ref >90)
GFR calc non Af Amer: 78 mL/min — ABNORMAL LOW (ref >90)
GLUCOSE: 99 mg/dL (ref 70–99)
POTASSIUM: 3.9 mmol/L (ref 3.5–5.1)
SODIUM: 148 mmol/L — AB (ref 135–145)

## 2014-05-30 LAB — SODIUM: Sodium: 144 mmol/L (ref 135–145)

## 2014-05-30 MED ORDER — CLONIDINE HCL 0.2 MG/24HR TD PTWK
0.2000 mg | MEDICATED_PATCH | TRANSDERMAL | Status: DC
  Administered 2014-05-30: 0.2 mg via TRANSDERMAL
  Filled 2014-05-30: qty 1

## 2014-05-30 MED ORDER — DEXTROSE 5 % IV SOLN: 1.0000 g | INTRAVENOUS | Status: DC

## 2014-05-30 MED ORDER — HYDRALAZINE HCL 20 MG/ML IJ SOLN
5.0000 mg | INTRAMUSCULAR | Status: DC | PRN
Start: 2014-05-30 — End: 2014-05-31
  Administered 2014-05-30 (×2): 5 mg via INTRAVENOUS
  Filled 2014-05-30 (×2): qty 1

## 2014-05-30 MED ORDER — DEXTROSE 5 % IV SOLN
INTRAVENOUS | Status: DC
  Administered 2014-05-30 (×2): via INTRAVENOUS

## 2014-05-30 NOTE — Progress Notes (Signed)
Report called to [place], Shelly Bombard. Patient transporting to room 1511 via hospital bed.  Daughter called from [place].  Paged the doctor and gave him the daughter's number. MD spoke with patient's daughter.

## 2014-05-30 NOTE — Progress Notes (Addendum)
[facility] Progress Note   [name] OBS:[id number] DOB: October 07, 1924 DOA: 05/28/2014 PCP: Maryland Pink, MD  Brief narrative: [name] is a 81 y.o. female with Parkinson's disease with Lewy body dementia, CHF unspecified, A-fib, HTN, Hypothyriod, DM who resided in a Nursing home who was sent for weakness and having not eaten for 2 days. She was recently started on treatment for a UTI with Nitrofurantoin. According to [place], she has frequent UTIs. She was incoherant in the ER, moaning and not answering questions. At baseline she in non-communicative.    Subjective: Difficult to arouse today, non communicative.  Assessment/Plan:  Acute encephalopathy - Patient with poor mentation at baseline secondary to dementia, worsened by metabolic encephalopathy secondary to hypernatremia, dehydration, and UTI. - NPO for now until more alert to prevent aspiration  - CT head with no acute intracranial finding.  Dehydration / hypernatremia - Secondary to volume depletion and decreased oral intake. - Initially on IV normal saline,transitioned to half-normal saline, given her uncontrolled blood pressure, will change to D5W, we can increase the rate from 150 to 75.  HTN urgency - starting Lopressor IV- remain elevated so started on PRN hydralazine   Atrial fibrillation- chronic problem - possibly made worse by dehydration- now in sinus rhythm   - was given fluids as mentioned above-  holding oral metoprolol and on IV Lopressor - not on anticoagulation other than ASA which I have switched to a suppository for now - TFTs normal  UTI - was sensitive to Ampicillin per culture results- will start- Nitrofurantion was started at [facility] on 3/23- stop date for a 7 day course will be 3/30-- remove Foley as soon as able   Hypothyroidism - resume synthroid via IV - TFTs WNL   Anxiety state - holding Clorazepate- no sedatives for now as I want to see if she awakens - will ask for  sitter   CHF, chronic? - Currently hypovolemic, follow for fluid overload- last ECHO in 2011 revealed normal EF and no mention of diastolic dysfunction which she may have if she has chronic HTN - PO2 is still 96% on room air today   Hypokalemia - Repleted   DM type 2, goal A1C 7-8 - will place on low dose sliding scale Q4 hrs and hold Metformin     Code Status: DNR Family Communication: None at bedside Disposition Plan: follow in SDU DVT prophylaxis: Heparin Consultants: Procedures:  Antibiotics: Anti-infectives    Start     Dose/Rate Route Frequency Ordered Stop   05/29/14 0000  ampicillin (OMNIPEN) 1 g in sodium chloride 0.9 % 50 mL IVPB     1 g 150 mL/hr over 20 Minutes Intravenous Every 6 hours 05/28/14 1533 05/30/14 2359   05/28/14 1500  ampicillin (OMNIPEN) 1 g in sodium chloride 0.9 % 50 mL IVPB     1 g 150 mL/hr over 20 Minutes Intravenous STAT 05/28/14 1448 05/28/14 1636      Objective: Filed Weights   05/28/14 1441 05/29/14 0514  Weight: 40.824 kg (90 lb) 43.1 kg (95 lb 0.3 oz)    Intake/Output Summary (Last 24 hours) at 05/30/14 1017 Last data filed at 05/30/14 0800  Gross per 24 hour  Intake   2960 ml  Output    561 ml  Net   2399 ml     Vitals Filed Vitals:   05/30/14 0630 05/30/14 0700 05/30/14 0800 05/30/14 0828  BP: 177/109 185/92 182/112 169/79  Pulse: 90 90 92 92  Temp:  97.6 F (36.4 C)   TempSrc:   Oral   Resp: 20 28  25   Height:      Weight:      SpO2: 95% 95% 96% 94%    Exam:  General:  Pt is lethargic,  not in acute distress- frail thin woman  HEENT: No icterus, No thrush, dry oral mucosa  Cardiovascular: regular rate and rhythm, S1/S2 No murmur  Respiratory: clear to auscultation bilaterally   Abdomen: Soft, +Bowel sounds, non tender, non distended, no guarding  MSK: No LE edema, cyanosis or clubbing  Data Reviewed: Basic Metabolic Panel:  Recent Labs Lab 05/28/14 1220 05/28/14 1231 05/28/14 1424  05/29/14 0300 05/30/14 0345  NA 154* 154* 152* 152* 148*  K 2.9* 2.8* 3.4* 3.3* 3.9  CL 114* 112 114* 120* 117*  CO2 28  --  25 23 20   GLUCOSE 187* 187* 123* 126* 99  BUN 58* 51* 58* 47* 35*  CREATININE 0.63 0.60 0.49* 0.49* 0.64  CALCIUM 10.3  --  9.9 9.4 9.3  MG  --   --  1.8  --   --    Liver Function Tests:  Recent Labs Lab 05/28/14 1220  AST 39*  ALT 28  ALKPHOS 112  BILITOT 2.2*  PROT 7.2  ALBUMIN 3.3*   No results for input(s): LIPASE, AMYLASE in the last 168 hours. No results for input(s): AMMONIA in the last 168 hours. CBC:  Recent Labs Lab 05/28/14 1220 05/28/14 1231 05/29/14 0300  WBC 10.5  --  10.6*  NEUTROABS 7.8*  --   --   HGB 14.7 16.7* 13.3  HCT 45.9 49.0* 42.1  MCV 81.8  --  82.1  PLT 253  --  218   Cardiac Enzymes: No results for input(s): CKTOTAL, CKMB, CKMBINDEX, TROPONINI in the last 168 hours. BNP (last 3 results) No results for input(s): BNP in the last 8760 hours.  ProBNP (last 3 results) No results for input(s): PROBNP in the last 8760 hours.  CBG:  Recent Labs Lab 05/29/14 1607 05/29/14 2001 05/29/14 2313 05/30/14 0423 05/30/14 0740  GLUCAP 101* 111* 94 106* 106*    Recent Results (from the past 240 hour(s))  MRSA PCR Screening     Status: None   Collection Time: 05/28/14  4:15 PM  Result Value Ref Range Status   MRSA by PCR NEGATIVE NEGATIVE Final    Comment:        The GeneXpert MRSA Assay (FDA approved for NASAL specimens only), is one component of a comprehensive MRSA colonization surveillance program. It is not intended to diagnose MRSA infection nor to guide or monitor treatment for MRSA infections.      Studies:  Recent x-ray studies have been reviewed in detail by the Attending Physician  Scheduled Meds:  Scheduled Meds: . ampicillin (OMNIPEN) IV  1 g Intravenous Q6H  . aspirin  300 mg Rectal Daily  . heparin  5,000 Units Subcutaneous 3 times per day  . insulin aspart  0-9 Units Subcutaneous  Q4H  . levothyroxine  37.5 mcg Intravenous Daily  . metoprolol  5 mg Intravenous 4 times per day   Continuous Infusions: . sodium chloride 150 mL/hr at 05/30/14 0032    Time spent on care of this patient: 26 min   Rubbie Goostree, MD [phone] 05/30/2014, 10:17 AM  LOS: 2 days   [facility] Office  [phone] Pager - Text Page per [email or web address]  If 7PM-7AM, please contact night-coverage [email or web address]

## 2014-05-31 DIAGNOSIS — R6251 Failure to thrive (child): Secondary | ICD-10-CM

## 2014-05-31 DIAGNOSIS — Z515 Encounter for palliative care: Secondary | ICD-10-CM

## 2014-05-31 LAB — BASIC METABOLIC PANEL
Anion gap: 7 (ref 5–15)
BUN: 20 mg/dL (ref 6–23)
CHLORIDE: 106 mmol/L (ref 96–112)
CO2: 25 mmol/L (ref 19–32)
Calcium: 8.3 mg/dL — ABNORMAL LOW (ref 8.4–10.5)
Creatinine, Ser: 0.43 mg/dL — ABNORMAL LOW (ref 0.50–1.10)
GFR calc non Af Amer: 89 mL/min — ABNORMAL LOW (ref >90)
GLUCOSE: 123 mg/dL — AB (ref 70–99)
POTASSIUM: 2.7 mmol/L — AB (ref 3.5–5.1)
SODIUM: 138 mmol/L (ref 135–145)

## 2014-05-31 LAB — CBC
HCT: 40.6 % (ref 36.0–46.0)
Hemoglobin: 12.8 g/dL (ref 12.0–15.0)
MCH: 25.5 pg — AB (ref 26.0–34.0)
MCHC: 31.5 g/dL (ref 30.0–36.0)
MCV: 81 fL (ref 78.0–100.0)
Platelets: 235 10*3/uL (ref 150–400)
RBC: 5.01 MIL/uL (ref 3.87–5.11)
RDW: 19 % — ABNORMAL HIGH (ref 11.5–15.5)
WBC: 11.7 10*3/uL — ABNORMAL HIGH (ref 4.0–10.5)

## 2014-05-31 LAB — GLUCOSE, CAPILLARY
GLUCOSE-CAPILLARY: 102 mg/dL — AB (ref 70–99)
Glucose-Capillary: 143 mg/dL — ABNORMAL HIGH (ref 70–99)
Glucose-Capillary: 125 mg/dL — ABNORMAL HIGH (ref 70–99)

## 2014-05-31 MED ORDER — ACETAMINOPHEN 650 MG RE SUPP: 650.0000 mg | Freq: Four times a day (QID) | RECTAL | Status: AC | PRN

## 2014-05-31 MED ORDER — MORPHINE SULFATE (CONCENTRATE) 10 MG/0.5ML PO SOLN: 5.0000 mg | ORAL | Status: DC | PRN

## 2014-05-31 MED ORDER — MORPHINE SULFATE (CONCENTRATE) 10 MG/0.5ML PO SOLN: 5.0000 mg | ORAL | Status: AC | PRN

## 2014-05-31 MED ORDER — LORAZEPAM 1 MG PO TABS: 1.0000 mg | ORAL_TABLET | ORAL | Status: DC | PRN

## 2014-05-31 MED ORDER — LORAZEPAM 1 MG PO TABS: 1.0000 mg | ORAL_TABLET | ORAL | Status: AC | PRN

## 2014-05-31 MED ORDER — POTASSIUM CHLORIDE 10 MEQ/100ML IV SOLN
10.0000 meq | INTRAVENOUS | Status: DC
  Administered 2014-05-31 (×3): 10 meq via INTRAVENOUS
  Filled 2014-05-31 (×6): qty 100

## 2014-05-31 NOTE — Discharge Instructions (Signed)
To be followed by hospice at [facility].

## 2014-05-31 NOTE — Progress Notes (Signed)
Gave report to [place] at [place]. Left number in case she had additional questions.

## 2014-05-31 NOTE — Consult Note (Signed)
Patient [name]      DOB: 1925/02/18      UJW:[id number]     Consult Note from the Palliative Medicine Team at [place] Requested by: Dr Izora Gala     PCP: Maryland Pink, MD Reason for Consultation:Clarification of [place] and options     Phone [phone]  Assessment of patients Current state:  Continued physical, functional and cognitive decline 2/2 to multiple co-morbidities; PD with Lewy body dementia, CHF, A-fib, DM, hypothyroid.   Family faced with advanced directive decisions and anticipatory care needs.  Consult is for review of medical treatment options, clarification of goals of care and end of life issues, disposition and options, and symptom recommendation.  This NP Wadie Lessen reviewed medical records, received report from team, assessed the patient and then meet at the patient's bedside and spoke by phone with HPOA Zipporah Plants and daughter Hilaria Ota  to discuss diagnosis prognosis, [place], EOL wishes disposition and options.  A detailed discussion was had today regarding advanced directives.  Concepts specific to code status, artifical feeding and hydration, continued IV antibiotics and rehospitalization was had.  The difference between a aggressive medical intervention path  and a palliative comfort care path for this patient at this time was had.  Values and goals of care important to patient and family were attempted to be elicited.  Concept of Hospice and Palliative Care were discussed  Natural trajectory and expectations at EOL were discussed.  Questions and concerns addressed.  Family encouraged to call with questions or concerns.  PMT will continue to support holistically.   Goals of Care: 1.  Code Status   DNR/DNI   2. Scope of Treatment: Comfort is main focus of care  3. Disposition:  Back to SNF with hospice care appraoch   4. Symptom Management:   1. Anxiety/Agitation: Ativan 1 mg po/sl every 4 hrs prn 2. Pain/Dyspnea:  Roxanol 5 mg po/sl every 1 hr prn 3. Bowel Regimen: Dulcolax supp daily prn   5. Psychosocial:  Emotional support offered to family    Brief HPI:  [name] is a 81 y.o. female with Parkinson's disease with Lewy body dementia, CHF unspecified, A-fib, HTN, Hypothyriod, DM who resided in a Nursing home and is total care .  Continued physical, functional and cognitive decline 2/2 to multiple co-morbidities; PD with Lewy body dementia, CHF, A-fib, DM, hypothyroid.   Frequent infection with hospitalizations, continued failure to thrive     ROS: unable to illict   PMH:  Past Medical History  Diagnosis Date  . Hypertension   . Diabetes mellitus   . Hypothyroidism   . Vertigo   . Anemia   . Osteoarthritis   . GERD (gastroesophageal reflux disease)   . Diverticulosis   . Depression   . H/O: hysterectomy     at age 16  . COPD (chronic obstructive pulmonary disease)   . History of DVT (deep vein thrombosis)   . Anxiety   . Dyspnea   . Enlarged heart   . Fibromyalgia   . Degenerative joint disease   . Degenerative disk disease   . Tremor   . Falls   . Iron deficiency anemia, unspecified 07/15/2012  . CHF (congestive heart failure)   . Cancer     basal cell  . Atrial fibrillation 08/21/2013  . HIATAL HERNIA WITH REFLUX 10/13/2006    Qualifier: Diagnosis of  By: Silvio Pate MD, [facility]   . COPD with chronic bronchitis 10/13/2006  Never a primary smoker, but secondhand smoke from husband by history. Emphysema and chronic bronchitis, mild.    . Senile dementia, uncomplicated 9/67/5916    Qualifier: Diagnosis of  By: [place] MD, Frederico Hamman    . VERTIGO 10/02/2008    Qualifier: Diagnosis of  By: Lorelei Pont MD, Frederico Hamman    . TREMOR, ESSENTIAL 10/13/2006    Qualifier: Diagnosis of  By: Silvio Pate MD, [facility]   . POSITIVE PPD 06/02/2007    Qualifier: Diagnosis of  By: Julien Girt CMA, Marliss Czar    . COLONIC POLYPS, HX OF 10/13/2006    Qualifier: Diagnosis of  By: Silvio Pate MD, [facility]       PSH: Past Surgical History  Procedure Laterality Date  . Cholecystectomy  1944  . Appendectomy  1952  . Ovarian cyst removal  1982    secondary ventral hematoma  . Bladder tack  1997  . Cataract extraction, bilateral    . Kyphosis surgery    . Total abdominal hysterectomy    . Salpingoophorectomy      unilateral salpingo-oophorectomy  . Orif right hip     I have reviewed the FH and SH and  If appropriate update it with new information. Allergies  Allergen Reactions  . Amoxicillin     Unknown reaction - Per MAR   . Aricept [Donepezil Hydrochloride] Other (See Comments)    Delusions  . Demerol [Meperidine]     Unknown reaction - Per MAR   . Depakote [Divalproex [facility] stated they suspect Pt is having an allergic reaction to med  . Hydrocodone-Acetaminophen     Unknown reaction - Per MAR   . Metoprolol Other (See Comments)    Dizzy   . Nitrofurantoin     Unknown reaction   . Oxycodone     Unknown reaction - Per MAR   . Sulfonamide Derivatives     Unknown reaction - Per MAR    Scheduled Meds: . aspirin  300 mg Rectal Daily  . cloNIDine  0.2 mg Transdermal Weekly  . heparin  5,000 Units Subcutaneous 3 times per day  . insulin aspart  0-9 Units Subcutaneous Q4H  . levothyroxine  37.5 mcg Intravenous Daily  . metoprolol  5 mg Intravenous 4 times per day  . potassium chloride  10 mEq Intravenous Q1 Hr x 6   Continuous Infusions:  PRN Meds:.acetaminophen **OR** acetaminophen, hydrALAZINE, ondansetron **OR** ondansetron (ZOFRAN) IV    BP 153/88 mmHg  Pulse 76  Temp(Src) 98 F (36.7 C) (Oral)  Resp 22  Ht 5\' 1"  (1.549 m)  Wt 43.1 kg (95 lb 0.3 oz)  BMI 17.96 kg/m2  SpO2 98%   PPS:20 % at best   Intake/Output Summary (Last 24 hours) at 05/31/14 1046 Last data filed at 05/31/14 0900  Gross per 24 hour  Intake 1563.75 ml  Output   1220 ml  Net 343.75 ml    Physical Exam:  General: ill appearing, EOL prognosis HEENT:  Dry buccal  membranes Chest:   scattered buccal membranes CVS: RRR Abdomen:soft , +BS  Labs: CBC    Component Value Date/Time   WBC 11.7* 05/31/2014 0525   WBC 14.2* 02/15/2013 1439   RBC 5.01 05/31/2014 0525   RBC 4.13 02/15/2013 1439   RBC 4.31 02/15/2013 1439   HGB 12.8 05/31/2014 0525   HGB 12.4 02/15/2013 1439   HCT 40.6 05/31/2014 0525   HCT 36.2 02/15/2013 1439   PLT 235 05/31/2014 0525   PLT 298 02/15/2013  1439   MCV 81.0 05/31/2014 0525   MCV 88 02/15/2013 1439   MCH 25.5* 05/31/2014 0525   MCH 30.0 02/15/2013 1439   MCHC 31.5 05/31/2014 0525   MCHC 34.3 02/15/2013 1439   RDW 19.0* 05/31/2014 0525   RDW 13.3 02/15/2013 1439   LYMPHSABS 1.8 05/28/2014 1220   LYMPHSABS 2.5 02/15/2013 1439   MONOABS 0.9 05/28/2014 1220   EOSABS 0.1 05/28/2014 1220   EOSABS 0.1 02/15/2013 1439   BASOSABS 0.0 05/28/2014 1220   BASOSABS 0.0 02/15/2013 1439    BMET    Component Value Date/Time   NA 138 05/31/2014 0525   NA 136 10/13/2013 1522   K 2.7* 05/31/2014 0525   CL 106 05/31/2014 0525   CO2 25 05/31/2014 0525   GLUCOSE 123* 05/31/2014 0525   GLUCOSE 78 10/13/2013 1522   BUN 20 05/31/2014 0525   BUN 17 10/13/2013 1522   CREATININE 0.43* 05/31/2014 0525   CALCIUM 8.3* 05/31/2014 0525   GFRNONAA 89* 05/31/2014 0525   GFRAA >90 05/31/2014 0525    CMP     Component Value Date/Time   NA 138 05/31/2014 0525   NA 136 10/13/2013 1522   K 2.7* 05/31/2014 0525   CL 106 05/31/2014 0525   CO2 25 05/31/2014 0525   GLUCOSE 123* 05/31/2014 0525   GLUCOSE 78 10/13/2013 1522   BUN 20 05/31/2014 0525   BUN 17 10/13/2013 1522   CREATININE 0.43* 05/31/2014 0525   CALCIUM 8.3* 05/31/2014 0525   PROT 7.2 05/28/2014 1220   ALBUMIN 3.3* 05/28/2014 1220   AST 39* 05/28/2014 1220   ALT 28 05/28/2014 1220   ALKPHOS 112 05/28/2014 1220   BILITOT 2.2* 05/28/2014 1220   GFRNONAA 89* 05/31/2014 0525   GFRAA >90 05/31/2014 0525    Time In Time Out Total Time Spent with Patient Total Overall  Time  1030 1140 70 min 70 min    Greater than 50%  of this time was spent counseling and coordinating care related to the above assessment and plan.   Wadie Lessen NP  Palliative Medicine Team Team Phone # [phone] Pager [phone]  Discussed with Dr Waldron Labs

## 2014-05-31 NOTE — Discharge Summary (Signed)
[name], is a 81 y.o. female  DOB Feb 25, 1925  MRN [id number].  Admission date:  05/28/2014  Admitting Physician  [name] Odea, MD  Discharge Date:  05/31/2014   Primary MD  [name] Pink, MD  Recommendations for primary care physician for things to follow:  - management by hospice care at [facility].  Admission Diagnosis  Dehydration [E86.0] Hypokalemia [E87.6]   Discharge Diagnosis  Dehydration [E86.0] Hypokalemia [E87.6]    Principal Problem:   AKI (acute kidney injury) Active Problems:   Hypothyroidism   Anxiety state   Atrial fibrillation   CHF, chronic   Dehydration   Hypokalemia   DM type 2, goal A1C 7-8      Past Medical History  Diagnosis Date  . Hypertension   . Diabetes mellitus   . Hypothyroidism   . Vertigo   . Anemia   . Osteoarthritis   . GERD (gastroesophageal reflux disease)   . Diverticulosis   . Depression   . H/O: hysterectomy     at age 89  . COPD (chronic obstructive pulmonary disease)   . History of DVT (deep vein thrombosis)   . Anxiety   . Dyspnea   . Enlarged heart   . Fibromyalgia   . Degenerative joint disease   . Degenerative disk disease   . Tremor   . Falls   . Iron deficiency anemia, unspecified 07/15/2012  . CHF (congestive heart failure)   . Cancer     basal cell  . Atrial fibrillation 08/21/2013  . HIATAL HERNIA WITH REFLUX 10/13/2006    Qualifier: Diagnosis of  By: Silvio Pate MD, Baird Cancer   . COPD with chronic bronchitis 10/13/2006    Never a primary smoker, but secondhand smoke from husband by history. Emphysema and chronic bronchitis, mild.    . Senile dementia, uncomplicated 2/44/6286    Qualifier: Diagnosis of  By: Copland MD, Frederico Hamman    . VERTIGO 10/02/2008    Qualifier: Diagnosis of  By: Lorelei Pont MD, Frederico Hamman    . TREMOR, ESSENTIAL 10/13/2006   Qualifier: Diagnosis of  By: Silvio Pate MD, Baird Cancer   . POSITIVE PPD 06/02/2007    Qualifier: Diagnosis of  By: Julien Girt CMA, Marliss Czar    . COLONIC POLYPS, HX OF 10/13/2006    Qualifier: Diagnosis of  By: Silvio Pate MD, Baird Cancer     Past Surgical History  Procedure Laterality Date  . Cholecystectomy  1944  . Appendectomy  1952  . Ovarian cyst removal  1982    secondary ventral hematoma  . Bladder tack  1997  . Cataract extraction, bilateral    . Kyphosis surgery    . Total abdominal hysterectomy    . Salpingoophorectomy      unilateral salpingo-oophorectomy  . Orif right hip         History of present illness and  Hospital Course:     Kindly see H&P for history of present illness and admission details, please review complete Labs, Consult reports and Test reports for all details in brief  HPI  from the history and physical done on the day of admission.    [name] is a 81 y.o. female with Parkinson's disease with Lewy body dementia, CHF unspecified, A-fib, HTN, Hypothyriod, DM who resided in a Nursing home who was sent for weakness and having not eaten for 2 days. She was recently started on treatment for a UTI with Nitrofurantoin. According to Beattie, she has frequent UTIs. She was incoherant in the ER, moaning and not answering questions. At baseline she in non-communicative. Patient had multiple recent hospitalization, overall deterioration of functional and mental status over the last few months, discussion was carried by patient guardian, and daughter , and decision was made to proceed with comfort care measures only.  Comfort measure: - To be discharged to skilled nursing facility under hospice care, will be on when necessary Ativan and morphine( clarified allergy with daughter, no true allergy to morphine).  Acute encephalopathy - Patient with poor mentation at baseline secondary to dementia, worsened by metabolic encephalopathy secondary to hypernatremia,  dehydration, and UTI. - NPO for now until more alert to prevent aspiration  - CT head with no acute intracranial finding.   Dehydration / hypernatremia - Secondary to volume depletion and decreased oral intake.  HTN urgency    Atrial fibrillation- chronic problem - possibly made worse by dehydration- now in sinus rhythm    UTI    Hypothyroidism    Anxiety state    CHF, chronic?    Hypokalemia    DM type 2,     Discharge Condition:  Terminal         Discharge Instructions  and  Discharge Medications    Discharge Instructions    Discharge instructions    Complete by:  As directed   Discharged to skilled nursing home with hospice care.            Medication List    STOP taking these medications        acetaminophen 325 MG tablet  Commonly known as:  TYLENOL  Replaced by:  acetaminophen 650 MG suppository     aspirin 81 MG chewable tablet     clorazepate 3.75 MG tablet  Commonly known as:  TRANXENE     famotidine 20 MG tablet  Commonly known as:  PEPCID     feeding supplement (ENSURE COMPLETE) Liqd     furosemide 20 MG tablet  Commonly known as:  LASIX     hydrOXYzine 10 MG tablet  Commonly known as:  ATARAX/VISTARIL     lactobacillus acidophilus Tabs tablet     levothyroxine 75 MCG tablet  Commonly known as:  SYNTHROID, LEVOTHROID     magnesium oxide 400 MG tablet  Commonly known as:  MAG-OX     metFORMIN 500 MG tablet  Commonly known as:  GLUCOPHAGE     metoprolol 50 MG tablet  Commonly known as:  LOPRESSOR     MULTIVITAMIN PO     nitrofurantoin (macrocrystal-monohydrate) 100 MG capsule  Commonly known as:  MACROBID     potassium chloride 10 MEQ tablet  Commonly known as:  K-DUR     sennosides-docusate sodium 8.6-50 MG tablet  Commonly known as:  SENOKOT-S     sertraline 25 MG tablet  Commonly known as:  ZOLOFT     sertraline 50 MG tablet  Commonly known as:  ZOLOFT     Vitamin D (Ergocalciferol) 50000  UNITS Caps capsule  Commonly known as:  DRISDOL  TAKE these medications        acetaminophen 650 MG suppository  Commonly known as:  TYLENOL  Place 1 suppository (650 mg total) rectally every 6 (six) hours as needed for mild pain (or Fever >/= 101).     LORazepam 1 MG tablet  Commonly known as:  ATIVAN  Take 1 tablet (1 mg total) by mouth every 4 (four) hours as needed for anxiety.     morphine CONCENTRATE 10 MG/0.5ML Soln concentrated solution  Take 0.25 mLs (5 mg total) by mouth every hour as needed for moderate pain, severe pain or shortness of breath.          Diet and Activity recommendation: See Discharge Instructions above   Consults obtained -  Palliative care  Major procedures and Radiology Reports - PLEASE review detailed and final reports for all details, in brief -      Ct Head Wo Contrast  05/29/2014   CLINICAL DATA:  81 year old diabetic hypertensive female difficult to arouse today. Initial encounter.  EXAM: CT HEAD WITHOUT CONTRAST  TECHNIQUE: Contiguous axial images were obtained from the base of the skull through the vertex without intravenous contrast.  COMPARISON:  03/26/2014.  FINDINGS: Mild motion artifact posterior fossa.  No intracranial hemorrhage.  Prominent small vessel disease type changes without CT evidence of large acute infarct.  Mild global atrophy without hydrocephalus.  Vascular calcifications.  No intracranial mass lesion noted on this unenhanced exam.  Mastoid air cells, middle ear cavities and paranasal sinuses are clear.  IMPRESSION: No intracranial hemorrhage or CT evidence of large acute infarct.  Small vessel disease type changes.   Electronically Signed   By: Genia Del M.D.   On: 05/29/2014 14:11    Micro Results     Recent Results (from the past 240 hour(s))  MRSA PCR Screening     Status: None   Collection Time: 05/28/14  4:15 PM  Result Value Ref Range Status   MRSA by PCR NEGATIVE NEGATIVE Final    Comment:          The GeneXpert MRSA Assay (FDA approved for NASAL specimens only), is one component of a comprehensive MRSA colonization surveillance program. It is not intended to diagnose MRSA infection nor to guide or monitor treatment for MRSA infections.        Today   Subjective:   [name] none communicative  Objective:   Blood pressure 153/88, pulse 76, temperature 98 F (36.7 C), temperature source Oral, resp. rate 22, height 5\' 1"  (1.549 m), weight 43.1 kg (95 lb 0.3 oz), SpO2 98 %.   Intake/Output Summary (Last 24 hours) at 05/31/14 1355 Last data filed at 05/31/14 0900  Gross per 24 hour  Intake 1288.75 ml  Output   1120 ml  Net 168.75 ml    Exam  General: Pt is not arousable, not in acute distress- frail thin woman  HEENT: No icterus, No thrush, dry oral mucosa  Cardiovascular: regular rate and rhythm, S1/S2 No murmur  Respiratory: clear to auscultation bilaterally   Abdomen: Soft, +Bowel sounds, non tender, non distended, no guarding  MSK: No LE edema, cyanosis or clubbing  Data Review   CBC w Diff: Lab Results  Component Value Date   WBC 11.7* 05/31/2014   WBC 14.2* 02/15/2013   HGB 12.8 05/31/2014   HGB 12.4 02/15/2013   HCT 40.6 05/31/2014   HCT 36.2 02/15/2013   PLT 235 05/31/2014   PLT 298 02/15/2013   LYMPHOPCT 17 05/28/2014  LYMPHOPCT 17.7 02/15/2013   MONOPCT 8 05/28/2014   MONOPCT 6.2 02/15/2013   EOSPCT 1 05/28/2014   EOSPCT 0.4 02/15/2013   BASOPCT 0 05/28/2014   BASOPCT 0.1 02/15/2013    CMP: Lab Results  Component Value Date   NA 138 05/31/2014   NA 136 10/13/2013   K 2.7* 05/31/2014   CL 106 05/31/2014   CO2 25 05/31/2014   BUN 20 05/31/2014   BUN 17 10/13/2013   CREATININE 0.43* 05/31/2014   PROT 7.2 05/28/2014   ALBUMIN 3.3* 05/28/2014   BILITOT 2.2* 05/28/2014   ALKPHOS 112 05/28/2014   AST 39* 05/28/2014   ALT 28 05/28/2014  .   Total Time in preparing paper work, data evaluation and todays exam - 35  minutes  [name] M.D on 05/31/2014 at 1:55 PM  [facility]   Office  [phone]

## 2014-05-31 NOTE — Progress Notes (Signed)
CSW sent DC summary to [facility] who confirmed that they received it and are ready for Pt.   PTAR scheduled for 3:00pm: [id number]. RN notified and discharge packet placed on chart.  CSW notified Pt legal guardian, Zipporah Plants, of transport time. She was agreeable and reported that she would inform Pt's daugther.  CSW is signing off but available as needs arise.  Peri Maris, LCSWA 05/31/2014 2:32 PM [phone]

## 2014-05-31 NOTE — Progress Notes (Addendum)
Clinical Social Work Department BRIEF PSYCHOSOCIAL ASSESSMENT 05/31/2014  Patient:  DEONDRA, LABRADOR     Account Number:  [id number]     [place] date:  05/28/2014  Clinical Social Worker:  Maryln Manuel  Date/Time:  05/31/2014 11:00 AM  Referred by:  Physician  Date Referred:  05/31/2014 Referred for  SNF Placement   Other Referral:   Interview type:  Family Other interview type:    PSYCHOSOCIAL DATA Living Status:  FACILITY Admitted from facility:  [facility] Level of care:  [place] Primary support name:  [name] Douglas/guardian/[phone] Primary support relationship to patient:  NONE Degree of support available:   adequate    CURRENT CONCERNS Current Concerns  Post-Acute Placement   Other Concerns:   pt admitted from [place] at [facility] memory care unit    [place] / PLAN CSW received referral that pt admitted from [place] at [facility] memory care unit.    CSW received notification from MD that he has spoken to pt guardian and pt daughter and recommends transition back to [place] [facility] [place] with hospice services. Per MD, MD ordered Palliative Care to give blessing on plan for hospice at [facility].    CSW contacted pt guardian, Zipporah Plants via telephone and left voice message.    CSW contacted pt daughter, Dellie Catholic via telephone. CSW introduced self and explained role. Pt daughter confirmed that she spoke with MD this morning and also was attempting to reach PMT NP, Wadie Lessen as she had a message from PMT NP. Pt daughter confirmed plan to transition to comfort and have hospice follow at [place] [facility] [place]. Per pt daughter, pt was private pay at the facility memory care unit. Pt daughter feels comfortable with plan of transitioning by to [facility] with Hospice services and CSW explained that pt will likely discharge today.    CSW contacted PMT NP, Wadie Lessen to notify that CSW was able to  reach pt daughter via cell phone. PMT NP, Wadie Lessen discussed that she was able to reach pt guardian this morning who is agreeable to plan for hospice at [facility].    CSW will continue attempts to reach pt guardian.    CSW updated FL2 and contacted [facility]. [facility] confirmed that they could accept pt back today and facility will arrange hospice services from SNF.    CSW to facilitate pt discharge needs back to [facility] [place] with hospice services when pt medically ready for discharge.   Assessment/plan status:  Psychosocial Support/Ongoing Assessment of Needs Other assessment/ plan:   discharge planning   Information/referral to community resources:   Referral back to [facility]    PATIENT'S/FAMILY'S RESPONSE TO PLAN OF CARE: Per chart, pt disoriented x 4. Pt long term at [place] [facility] [place] and plan is for pt to return to [place] [facility] [place] with hospice services. Pt daughter agreeable to plan and coping appropriately with transition to comfort care and per PMT NP, Wadie Lessen spoke to pt guardian who is agreeable to plan.   Addendum 12:18 pm:  CSW received return phone call from pt guardian, Zipporah Plants. Pt guardian confirmed agreement to plan for pt to return to [facility] [place] with hospice to follow at [facility]. Pt guardian discussed that she planned to visit pt this afternoon in hospital, but since pt will be returning to [facility] she will await pt return and see pt at SNF. CSW notified pt guardian of plan to discharge  pt back to [facility] this afternoon.   CSW to facilitate pt discharge needs this afternoon.    Alison Murray, MSW, [place] [facility] [phone]

## 2014-05-31 NOTE — Progress Notes (Signed)
CRITICAL VALUE ALERT  Critical value received:  K 2.7  Date of notification:  05/31/14  Time of notification:  08/20/14  Critical value read back Yes  Nurse who received alert: Jearld Shines  MD notified (1st page): Dr. Baltazar Najjar  Time of first page:  0621  MD notified (2nd page):  Time of second page:  Responding MD:    Time MD responded:

## 2014-06-01 ENCOUNTER — Ambulatory Visit: Payer: Self-pay | Admitting: Nurse Practitioner

## 2014-06-01 DIAGNOSIS — D509 Iron deficiency anemia, unspecified: Secondary | ICD-10-CM

## 2014-06-01 NOTE — Progress Notes (Signed)
Discharge summary sent to payer through [facility]  

## 2014-06-23 NOTE — Op Note (Signed)
PATIENT NAME:  [name], [name] MR#:  [id number] DATE OF BIRTH:  February 27, 1925  DATE OF PROCEDURE:  10/24/2013   PREOPERATIVE DIAGNOSIS: Right intertrochanteric hip fracture.   POSTOPERATIVE DIAGNOSIS:  Right intertrochanteric hip fracture.   PROCEDURE: Open reduction and internal fixation, right intertrochanteric fracture, with cephalomedullary nail.   ANESTHESIA: Spinal.   SURGEON: Laurene Footman, M.D.   DESCRIPTION OF PROCEDURE: The patient was brought to the operating room and, after adequate anesthesia was obtained, the right leg was placed in the traction boot. The left leg in the well leg holder. After prepping and draping in the usual sterile fashion, appropriate patient identification and timeout procedures were completed, this was undertaken after first checking to make sure we had good visualization on both AP and lateral projections of the fracture.   After a timeout procedure had been completed, a small proximal incision was made and the soft tissue spread. The guidewire was placed into the tip of the trochanter and proximal reaming carried out with the Biomet Affixus set, a long guidewire was inserted down the canal to the appropriate level of the distal femur, and a measurement taken off of this. Reaming was carried out to 11 mm. A 9 x 340 mm right long Affixus rod was obtained and inserted down the canal to the appropriate level. A small lateral incision was made and the targeting device applied with a guidewire inserted into a near center/center position into the femoral head. This measured 100 mm. A 100 mm reaming was carried out and the 100 mm lag screw inserted. The set screw was placed proximally and allowed backed out a quarter turn to allow for compression. Compression was then applied to the lateral device, after traction was removed from the foot. The proximal insertion handle was then removed and permanent AP and lateral images were obtained. Next, going lateral on the distal  femoral view, visualization of the oblique hole of the distal rod was obtained and a small stab incision was made, drilled across this, measured, and a 5.0 cortical screw placed as a distal interlock. At this point, a permanent view of this was obtained as well.   The wounds were irrigated and the wound closed with 0 Vicryl for the deep tissue, 2-0 Vicryl subcutaneously and skin staples.   IMPLANTS: Biomet Affixus 100 mm lag screw, 130 degrees, 9 x 304 mm hip fracture nail, and a 5 x 40 mm cortical bone screw. There were no complications.   ESTIMATED BLOOD LOSS: 100 mL    CONDITION: To recovery room stable.    ____________________________ Laurene Footman, MD mjm:MT D: 10/24/2013 21:42:16 ET T: 10/25/2013 08:50:11 ET JOB#: [id number]  cc: Laurene Footman, MD, <Dictator> Laurene Footman MD ELECTRONICALLY SIGNED 10/25/2013 17:44

## 2014-06-23 NOTE — H&P (Signed)
PATIENT NAME:  [name], [name] MR#:  [id number] DATE OF BIRTH:  [birth year]  DATE OF ADMISSION:  02/03/2014  REFERRING PHYSICIAN: Loney Hering, MD  PRIMARY CARE PHYSICIAN: Irven Easterly. Kary Kos, MD  ADMISSION DIAGNOSIS: Left hip fracture.   HISTORY OF PRESENT ILLNESS: This is an 81 year old Caucasian female who presents to the Emergency Department after suffering a fall. The patient states that she has a chronic history of knee pain, which resulted in a weakness and giving way of her right leg. This caused her to fall as she struck her hip and could not get up. The patient states that her hip hurts but is bearable at this time, as long as she does not move the leg. Imaging of the hip shows a trochanteric fracture of the left hip, which prompted the Emergency Department to call for admission.   REVIEW OF SYSTEMS:  CONSTITUTIONAL: The patient denies fever, but admits to generalized weakness.  EYES: Denies inflammation, but admits to overall decreased visual acuity, which is chronic.  EARS, NOSE AND THROAT: Denies tinnitus or difficulty swallowing.  RESPIRATORY: Denies cough or shortness of breath.  CARDIOVASCULAR: Denies chest pain, palpitations, orthopnea.  GASTROINTESTINAL: Denies nausea, vomiting, diarrhea, or abdominal pain.  GENITOURINARY: Admits to increased frequency, but denies dysuria or hesitancy of urination.  ENDOCRINE: Denies polyuria or polydipsia.  HEMATOLOGIC AND LYMPHATIC: Denies easy bruising, but denies bleeding.  SKIN: Denies rashes or lesions.  MUSCULOSKELETAL: Admits to arthritis and weakness in her knees, as well as muscle cramping at times but these are all chronic complaints.  NEUROLOGIC: The patient denies numbness in her extremities or difficulty speaking.  PSYCHIATRIC: Denies depression or suicidal ideation.   PAST MEDICAL HISTORY: Diabetes type 2, osteopenia, hypertension, congestive heart failure, atrial fibrillation, COPD, and dementia.   PAST SURGICAL  HISTORY: Right hip replacement, hysterectomy, and back surgery.   SOCIAL HISTORY: The patient reports that she lives alone and has hired some help for chores around the house. However, previous documentation states that she is a resident of a nursing home. Both of these reported facts may be slightly skewed as she could be a resident of an assisted living facility or independent living community that has nursing aides help with the residents. It is clear that based on her description of her living situation, the patient may have some dementia.   FAMILY HISTORY: The patient reports vitamin D deficiency.   MEDICATIONS: 1.  Acetaminophen 325 mg 2 tablets p.o. every 4 hours as needed for mild pain or temperature greater than 100.4.  2.  Bethanechol 25 mg 1 tablet p.o. t.i.d. for 2 days then stop.  3.  Clorazepate 3.75 mg 1 tablet p.o. daily at bedtime.  4.  Enoxaparin 40 mg subcutaneously once daily for 14 days.  5.  Glucophage 500 mg 1 tablet p.o. b.i.d.  6.  Hydroxyzine 10 mg 1 tablet p.o. t.i.d. as needed for itching.  7.  Lasix 20 mg 1 tablet p.o. b.i.d. as needed for swelling.  8.  Multivitamin 1 tablet p.o. daily.  9.  Nexium 40 mg delayed release capsule 1 capsule p.o. daily.  10.   Potassium chloride 10 mEq extended release tablets, 1 tablet p.o. daily as needed for swelling when taking Lasix.  11.   Spiriva 18 mcg 1 capsule inhaled daily.  12.   Synthroid 75 mcg 1 tablet p.o. daily.  13.   Tapentadol 50 mg 1 tablet p.o. every 6 hours as needed for moderate to severe pain.  14.  Toprol-XL 100 mg extended release 1 tablet p.o. daily.  15.   Zantac 75 mg 1 tab p.o. daily.  16.   Zoloft 100 mg 1/2 tablet p.o. at bedtime.  It is clear that the patient does not take all of these medicines as many are from the same category. She is not sure which of these medicines she takes consistently.   ALLERGIES: DEMEROL, OXYCODONE, SULFA DRUGS, AND VICODIN.   PERTINENT LABORATORY RESULTS AND  RADIOGRAPHIC FINDINGS: Serum glucose is 132, BUN is 15, creatinine 0.95, serum sodium 135, potassium 4, chloride is 98, bicarbonate 27, calcium 8.6. White blood cell count is 10.6, hemoglobin 13.1, hematocrit 41.3, platelet count is 218,000. MCV is 84. INR is 1.3. X-ray of the chest shows stable appearance with no acute cardiomegaly or abnormality. X-ray of the left hip shows an intertrochanteric left hip fracture and status post or ORIF of a prior right hip fracture.   PHYSICAL EXAMINATION: VITAL SIGNS: Temperature is 97.2, pulse 91, respirations 19, blood pressure 161/100, pulse oximetry 95% on 2 liters of oxygen via nasal cannula.  GENERAL: The patient is alert and oriented x 2. She is in no apparent distress.  HEENT: Normocephalic, atraumatic. Pupils equal, round, and reactive to light and accommodation. Extraocular movements are intact. Mucous membranes are mildly tacky.  NECK: Trachea is midline. No adenopathy.  CHEST: Symmetric, atraumatic.  CARDIOVASCULAR: Regular rate and rhythm. Normal S1, S2. No rubs, clicks, or murmurs appreciated.  LUNGS: Clear to auscultation bilaterally. Normal effort and excursion.  ABDOMEN: Positive bowel sounds. Soft, nontender, nondistended. No hepatosplenomegaly.  GENITOURINARY: Deferred.  MUSCULOSKELETAL: The patient moves her right lower extremity as well as arms bilaterally to equal degree. Her range of motion is somewhat limited by strength which is weak as one might expect for her age. I have not tested her left lower extremity, as this would elicit a great amount of pain.  SKIN: No rashes or lesions.  EXTREMITIES: No clubbing, cyanosis, or edema.  NEUROLOGIC: Cranial nerves II through XII are grossly intact.  PSYCHIATRIC: Mood is normal. Affect is congruent.   ASSESSMENT AND PLAN: This is an 81 year old female admitted for a left hip fracture.  1.  Left hip intertrochanteric fracture seen on x-ray. Orthopedic surgery is aware and will consult for possible  surgery in the morning. The patient is a high risk for surgery due to her age and comorbidities. She is not cleared for surgical intervention. Or goal currently is to manage her pain.  2.  Diabetes type 2. Continue metformin and add sliding scale insulin while the patient is hospitalized.  3.  Hypertension, controlled on subsequent blood pressure readings.  4.  Atrial fibrillation. The patient is rate controlled. Will continue Toprol-XL. She is not on any anticoagulation presumably due to fall risk, although the patient does report taking aspirin. This may not be reliable, as this is not on her medication list. We may want to check a bleeding time if her treatment team wants to proceed with surgery.  5.  Congestive heart failure, stable. Will have Lasix on her medication list as needed medication for lower extremity swelling.  6.  Chronic obstructive pulmonary disease. Continue Spiriva.  7.  Osteopenia. Check a vitamin D level.  8.  Dementia. A prolonged conversation with the patient reveals that she has some memory loss, as well as some delusions. This may contribute to her history of falls, particularly in the evenings.  9.  Parkinsonism. During my exam, it became evident that the patient  has a somewhat masked facies and a pill-rolling tremor that comes and goes. I have not seen her walk, but I imagine she has a somewhat shuffling gait, which makes her more prone to falling as well.  10.   Deep vein thrombosis prophylaxis with heparin.  11.   Gastrointestinal prophylaxis is unnecessary, as the patient is not critically ill.   CODE STATUS: The patient is currently a full code. We may want to discuss this with her, although certainly this admission her risk of cardiac arrest or respiratory failure is certainly not eminent.   TIME SPENT ON ADMISSION ORDERS AND PATIENT CARE: Approximately 35 minutes.    ____________________________ Norva Riffle. Marcille Blanco, MD msd:at D: 02/03/2014 09:00:36  ET T: 02/03/2014 09:41:00 ET JOB#: [id number]  cc: Norva Riffle. Marcille Blanco, MD, <Dictator> Norva Riffle Tamanna Whitson MD ELECTRONICALLY SIGNED 02/04/2014 4:50

## 2014-06-23 NOTE — H&P (Signed)
PATIENT NAME:  [name], [name] MR#:  [id number] DATE OF BIRTH:  March 31, 1924  DATE OF ADMISSION:  10/24/2013  PRIMARY CARE PHYSICIAN: [place] Pink, MD  REFERRING PHYSICIAN: Dr. Jimmye Norman  CHIEF COMPLAINT: Fall today.   HISTORY OF PRESENT ILLNESS: This is an 81 year old Caucasian female with a history of hypertension, diabetes, CHF and A-fib who was sent from the nursing home to the ED due to fall today. The patient is alert, awake and oriented, in no acute distress. The patient said that she slipped and fell by accident in the nursing home today. She did not have any loss of consciousness. No dizziness or headache. No incontinence or weakness. She hurt her right shoulder, arm and right lower extremity. X-ray showed right hip and right humeral fracture, but the patient denies any fever, chills, cough, phlegm or shortness of breath. No dysuria, hematuria. Denies any other symptoms.   PAST MEDICAL HISTORY: Hypertension, diabetes, CHF, A-fib, COPD.  SOCIAL HISTORY: No smoking, alcohol drinking or illicit drugs. A nursing home resident.   PAST SURGICAL HISTORY: Hysterectomy, appendectomy, back surgery.  FAMILY HISTORY: Father has pancreatic cancer. Two brothers have heart disease.   ALLERGIES: DEMEROL, HCL, OXYCODONE, SULFA DRUGS, VICODIN.  HOME MEDICATIONS: Zoloft 100 mg 1/2 tablet once a day at bedtime, Zantac 75 mg p.o. p.r.n., Toprol-XL 100 mg p.o. once a day, Synthroid 75 mcg p.o. daily, Spiriva 18 mcg inhalation each inhaled once a day p.r.n. for shortness of breath, potassium chloride 10 mEq p.o. once a day p.r.n., Nexium 40 mg p.o. daily, multivitamin 1 tablet p.o. daily, Lasix 20 mg p.o. b.i.d. p.r.n. for swelling, hydroxyzine hydrochloride 10 mg p.o. 3 times a day p.r.n. for itching, Glucophage 500 mg p.o. b.i.d., Colace 1 cap once a day p.r.n., chlorazepate 3.75 mg p.o. once a day at bedtime, acetaminophen 325 mg p.o. at bedtime, Advil 200 mg p.o. once a day in the morning.   REVIEW OF  SYSTEMS: CONSTITUTIONAL: The patient denies any fever or chills. No headache or dizziness. No weakness.  EYES: No double vision or blurry vision.  ENT: No postnasal drip, slurred speech or dysphagia.  CARDIOVASCULAR: No chest pain, palpitation, orthopnea, nocturnal dyspnea. No leg edema.  PULMONARY: No cough, sputum, shortness of breath or hemoptysis.  GASTROINTESTINAL: No abdominal pain, nausea, vomiting, diarrhea. No melena or bloody stool. GENITOURINARY: No dysuria, hematuria, or incontinence.  SKIN: No rash or jaundice.  NEUROLOGY: No syncope, loss of consciousness, or seizure.  ENDOCRINE: No polyuria, polydipsia, heat or cold intolerance.  HEMATOLOGY: No easy bruising or bleeding.  MUSCULOSKELETAL: Right arm and hip pain.   PHYSICAL EXAMINATION:  VITAL SIGNS: Temperature 98.4, blood pressure 148/96, pulse 82, respiration 18, O2 saturation 96% in room air.  GENERAL: The patient is alert, awake and oriented, in no acute distress.  HEENT: Pupils round and equally reactive to light and accommodation.  Moist oral mucosa. Clear oropharynx. NECK: Supple. No JVD or carotid bruit. No lymphadenopathy. No thyromegaly.  CARDIOVASCULAR: S1 and S2 irregular rate and rhythm.  No murmurs or gallops.  PULMONARY: Bilateral air entry. No wheezing or rales. No use of accessory muscles to breathe.  ABDOMEN: Soft. No distention or tenderness. No organomegaly. Bowel sounds present.  EXTREMITIES: No edema, clubbing or cyanosis. No calf tenderness. Bilateral pedal pulses present. The patient cannot move right lower extremity and right upper extremity.  NEUROLOGY: Alert and oriented x3. No focal deficit, but as mentioned above the cannot move her right arm and leg.   DIAGNOSTIC DATA: CBC in normal  range. Glucose 118, BUN 14, creatinine 0.8. Electrolytes normal. Urinalysis negative.  Right humerus x-ray showed comminuted fracture of proximal humeral metaphysis with impaction. No dislocation.   Right femur  x-ray showed nondisplaced right hip fracture.  Chest x-ray: No evidence of acute cardiopulmonary disease.   EKG showed A-fib at 86 bpm.   IMPRESSION: 1.  Right hip and right humeral fracture.  2.  Atrial fibrillation, rate controlled.  3.  Hypertension, controlled.  4.  Diabetes, controlled.  5.  History of congestive heart failure, unknown type.   PLAN OF TREATMENT: 1.  The patient will be admitted to medical floor with telemonitor. The patient has moderate to high risk for surgery. We will get a cardiology consult for pre-operation evaluation due to history of atrial fibrillation and congestive heart failure.  2.  We will continue Toprol.  3.  Follow up with Dr. Rudene Christians for hip surgery.  4.  For diabetes, we will start sliding scale.  5.  SCD and deep vein thrombosis prophylaxis after surgery and physical therapy after surgery.   Discussed the patient's condition and plan of treatment with the patient. The patient's POA and the patient wants DNR.   TIME SPENT: About 68 minutes.  ____________________________ Demetrios Loll, MD qc:sb D: 10/24/2013 11:42:32 ET T: 10/24/2013 12:20:47 ET JOB#: [id number]  cc: Demetrios Loll, MD, <Dictator> Demetrios Loll MD ELECTRONICALLY SIGNED 10/25/2013 16:29

## 2014-06-23 NOTE — Consult Note (Signed)
PATIENT NAME:  [name], KARCH MR#:  [id number] DATE OF BIRTH:  28-Apr-1924  DATE OF CONSULTATION:  02/03/2014  REQUESTING PHYSICIAN:  Norva Riffle. Marcille Blanco, MD  CONSULTING PHYSICIAN:  Mardene Sayer, MD  HISTORY OF PRESENT ILLNESS: The patient is an 81 year old female who has a chronic history of knee pain, which resulted in weakness and giving way at her knee and she fell on her hip. She was unable to get up. She was brought to the Emergency Department for evaluation.   When I interview the patient, she is confused, unable to give me an accurate history. Please note, the above listed history was obtained from the admitting physician's note.   She is confused upon my interview now, and is unable to give me an accurate.   REVIEW OF SYSTEMS: Unable to obtain secondary to altered mental status.   PAST MEDICAL HISTORY: Type 2 diabetes, osteopenia, hypertension, CHF, atrial fibrillation, COPD, and dementia.   SURGICAL HISTORY: Right hip intramedullary nail secondary to an intertrochanteric fracture several months ago.   SOCIAL HISTORY: Unable to obtain secondary to dementia.   FAMILY HISTORY: Unable to obtain.   MEDICATIONS: Extensive list, please see electronic medical record.   ALLERGIES: DEMEROL, OXYCODONE, SULFA DRUGS, AND VICODIN.   RADIOGRAPHS: X-rays AP pelvis demonstrated a 2- part stable fracture pattern, intertrochanteric fracture of the left hip.   DISCUSSION: I did have a discussion with the patient's power of attorney, [name], via the phone. I explained the nature of hip fractures, we did speak briefly about Ms. Vandyke's specific situation. I explained that if she wants to have any improvement in her pain that surgical stabilization is typically recommended. The patient's POA would like to proceed with this.   PLAN: We will plan on operative hip stabilization at the soonest available time.   TIME SPENT: 20 minutes were spent on this consultation.     ____________________________ Mardene Sayer, MD pa:bm D: 02/03/2014 22:27:42 ET T: 02/04/2014 03:11:18 ET JOB#: [id number]  cc: Mardene Sayer, MD, <Dictator> Raelyn Ensign. Stephanie Acre, MD PhD Orthopaedic Surgery ELECTRONICALLY SIGNED 02/04/2014 21:37

## 2014-06-23 NOTE — Consult Note (Signed)
Brief Consult Note: Diagnosis: right intertrochanteric hip fracture, right proximal humerus fracture.   Patient was seen by consultant.   Recommend to proceed with surgery or procedure.   Comments: recommend orif right hip today. recommend nonoperative treatemtn of proximal humerus. patient not certain if she wants surgery here or at [facility].  Electronic Signatures: Laurene Footman (MD)  (Signed 25-Aug-15 10:12)  Authored: Brief Consult Note   Last Updated: 25-Aug-15 10:12 by Laurene Footman (MD)

## 2014-06-23 NOTE — Discharge Summary (Signed)
PATIENT NAME:  [name], [name] MR#:  [id number] DATE OF BIRTH:  03-Jun-1924  DATE OF ADMISSION:  10/24/2013 DATE OF DISCHARGE:  10/27/2013   PRIMARY CARE PHYSICIAN:  Irven Easterly. Kary Kos, MD  FINAL DIAGNOSES:  1.  Hip fracture.  2.  Humerus fracture.  3.  Hypertension.  4.  History of congestive heart failure; no signs on this hospital stay.  5.  Urinary retention.  6.  Gastroesophageal reflux disease.  7.  Hypothyroidism.  8.  Diabetes.  9.  Anxiety.  10.  Atrial fibrillation.   MEDICATIONS ON DISCHARGE: Include: Synthroid 75 mcg daily, Nexium 40 mg daily, Zantac 75 mg daily as needed for heartburn, Toprol-XL 100 mg extended-release daily, Glucophage 500 mg twice a day, multivitamin 1 tablet daily, clorazepate 3.75 mg at bedtime, Lasix 20 mg twice a day as needed for swelling, potassium chloride 10 mEq once a day as needed for swelling when taking Lasix, Spiriva 18 mcg 1 inhalation daily, Zoloft 100 mg half tablet at bedtime, hydroxyzine hydrochloride 10 mg 3 times a day as needed for itching, acetaminophen 325 mg 2 tablets every 4 hours as needed for mild pain or temperature, enoxaparin 40 mg subcutaneous once a day for 14 days, Nucynta 50 mg every 6 hours as needed for moderate to severe pain, bethanechol 25 mg 3 times a day for 2 days, then stop.   TREATMENT: In and out of catheter every 6 hours as needed for urinary retention.   DIET: Low-sodium diet, carbohydrate -controlled diet, regular consistency.   ACTIVITY: As tolerated with physical therapy.   FOLLOWUP: With Dr. Hessie Knows as an outpatient. In 1-2 days with doctor at rehab.   HOSPITAL COURSE: The patient was admitted 10/24/2013 and discharged 10/27/2013. He came in after a fall. He was found to have a right hip fracture.    LABORATORY AND RADIOLOGICAL DATA: During the hospital course, included: A chest x-ray that was negative. Femur x-ray: Nondisplaced intertrochanteric right hip fracture. Humerus: Fracture communicated of  proximal humerus metaphysis with impaction. EKG showed atrial fibrillation. Urinalysis negative. Glucose 118, BUN 14, creatinine 0.8, sodium 136, potassium 3.7, chloride 99, CO2 of 28, calcium 8.9. Liver function tests normal range. White blood cell count 10.2, hemoglobin and hematocrit 12.1 and 37.8, platelet count of 225,000. Hemoglobin A1c 6.4. Hemoglobin upon discharge 10.8.   HOSPITAL COURSE PER PROBLEM LIST:  1.  Right hip fracture. The patient went to the operating room by Dr. Rudene Christians for repair. Physical therapy has seen the patient. The patient is going to go to rehab. Pain control with Tylenol and Nucynta.  2.  Humerus fracture. Sling for the right arm.  3.  Hypertension. Blood pressure stable.  4.  History of congestive heart failure. No signs on this hospital stay.  5.  Urinary retention. We will do an in-and-out catheterization, try to avoid Foley by using bethanechol for a couple of days.  May need to do in-and-out catheterizations q. 6 hours if continues to retain urine.  Gastroesophageal reflux disease, on Nexium.  Hypothyroidism, on Synthroid.  Anxiety, on Zoloft and clorazepate.  Atrial fibrillation, rate controlled on Toprol.   TIME SPENT ON DISCHARGE: 40 minutes.   Lungs upon discharge are clear. Heart rate controlled, S1, S2, irregularly irregular, controlled. Abdomen tender over the bladder. Bladder scan showed 900 mL. In-and-out catheterization will be done and bethanechol started.   TIME SPENT ON DISCHARGE: 40 minutes    ____________________________ Tana Conch. Leslye Peer, MD rjw:MT D: 10/27/2013 09:05:19 ET T: 10/27/2013 09:23:12  ET JOB#: [id number]  cc: Tana Conch. Leslye Peer, MD, <Dictator> Irven Easterly. Kary Kos, [place] MD ELECTRONICALLY SIGNED 11/03/2013 15:08

## 2014-06-23 NOTE — H&P (Signed)
PATIENT NAME:  [name], [name] MR#:  [id number] DATE OF BIRTH:  Aug 02, 1924  DATE OF ADMISSION:  02/14/2014  PRIMARY CARE PHYSICIAN:  Irven Easterly. Kary Kos, MD   REFERRING PHYSICIAN:  Jon Gills. Lord, MD   CHIEF COMPLAINT:  Altered mental status and UTI.   HISTORY OF PRESENT ILLNESS:  This is an 81 year old Caucasian female with a history of left hip fracture recently, hypertension, and diabetes, who was sent from the nursing home due to confusion and UTI. The patient recently was admitted for left hip fracture and was discharged from the nursing home. The patient's condition has been declining since that time. The patient was diagnosed with a UTI 10 days ago and has been treated with Invanz IM in the nursing home; however, the patient's mental status decreased. The patient is confused and unable to communicate. In addition, the patient has very poor oral intake and generalized weakness, so the patient was sent to the ED for further evaluation. The patient is confused and unable to provide any information.   PAST MEDICAL HISTORY:  Hypertension, diabetes, congestive heart failure, atrial fibrillation, COPD, dementia.   PAST SURGICAL HISTORY:  Bilateral hip surgery, hysterectomy, cholecystectomy, back surgery.   SOCIAL HISTORY:  The patient is a nursing home resident. No smoking, drinking, or illicit drugs.   FAMILY HISTORY:  Unable to obtain at this time.   REVIEW OF SYSTEMS:  Unable to obtain due to the patient's mental status.   ALLERGIES:  DEMEROL, OXYCODONE, SULFA DRUG, VICODIN.   HOME MEDICATIONS:   1.  Zoloft 50 mg p.o. daily.  2.  Zantac 75 mg p.o. daily p.r.n.  3.  Toprol-XL 100 mg p.o. daily. 4.  Tapentadol 50 mg p.o. every 6 hours p.r.n. for severe pain.  5.  Synthroid 75 mcg p.o. daily.  6.  Spiriva 18 mcg inhalation 1 inhaled once a day. 7.  Potassium 10 mEq once a day p.r.n. 8.  Omeprazole 20 mg p.o. daily.  9.  NovoLog FlexPen sliding scale.  10.  Multivitamin 1 tablet p.o.  daily.  11.  Magnesium oxide 400 mg p.o. daily.  12.  Lasix 20 mg p.o. b.i.d. p.r.n.  13.  Invanz 1 gram injectable once a day.  14.  Hydroxyzine hydrochloride 10 mg p.o. 3 times a day.  15.  Glucophage 500 mg p.o. b.i.d.  16.  Lovenox 70 mg subcutaneously every 12 hours.  17.  Colace 100 mg 2 capsules b.i.d.  18.  Clorazepate 3.75 mg p.o. once a day at bedtime.  19.  Bisacodyl 5 mg p.o. b.i.d.  20.  Aspirin 81 mg p.o. daily.  21.  Alprazolam 0.5 mg p.o. b.i.d.  22.  Acetaminophen 325 mg p.o. 2 tablets every 4 hours p.r.n.   PHYSICAL EXAMINATION: VITAL SIGNS:  Temperature 97.7, blood pressure 124/76, pulse 80, oxygen saturation 98% on room air.  GENERAL:  The patient is confused, unable to talk, lethargic, in no acute distress.  HEENT:  Pupils are round, equal, and reactive to light. Very dry oral mucosa.  NECK:  Supple. No JVD or carotid bruit. No lymphadenopathy. No thyromegaly.  CARDIOVASCULAR:  S1 and S2. Regular rate and rhythm. No murmurs or gallops.  PULMONARY:  Bilateral air entry. No wheezing or rales. No use of accessory muscles to breathe.  ABDOMEN:  Soft. No distention or tenderness. No organomegaly. Bowel sounds present.  EXTREMITIES:  No edema, clubbing, or cyanosis. No calf tenderness. Bilateral pedal pulses present.  SKIN:  No rash or jaundice.  NEUROLOGIC:  The patient is confused and does not follow commands; unable to examine.   LABORATORY DATA:   1.  Urinalysis showed WBC of 1463, RBC 8, nitrite positive, and bacteria 1+.  2.  CAT scan of the head:  No acute intracranial abnormality.  3.  Cervical spine CAT scan:  No acute fracture.  4.  Pelvis x-ray showed recent left hip fracture healing.  5.  Glucose is 151, BUN 13, creatinine 0.67, and electrolytes are normal except potassium of 3.4.  6.  WBC is 11.2, hemoglobin 12.1, and platelets are 304,000.  7.  Troponin is less than 0.02.   IMPRESSIONS: 1.  Urinary tract infection.  2.  Altered mental status due to  metabolic encephalopathy secondary to urinary tract infection.  3.  Hyponatremia.  4.  Hypokalemia.  5.  Mild leukocytosis.  6.  History of hypertension, diabetes, and congestive heart failure.   PLAN OF TREATMENT: 1.  The patient will be admitted to the medical floor. We will start Rocephin IVPB and follow up blood culture, urine culture, and CBC.  2.  Start fall and aspiration precautions.  3.  For diabetes, we will hold the Glucophage and start a sliding scale.  4.  Continue the patient's other home medications but hold alprazolam and Zoloft due to altered mental status.  5.  For hypertension, we will continue the patient's home medications Toprol and Lasix.   I discussed the patient's condition and plan of treatment with the patient's daughter.   CODE STATUS:  DO NOT RESUSCITATE.   TIME SPENT:  About 58 minutes.     ____________________________ Demetrios Loll, MD qc:nb D: 02/14/2014 21:07:27 ET T: 02/14/2014 22:21:06 ET JOB#: [id number]  cc: Demetrios Loll, MD, <Dictator> Demetrios Loll MD ELECTRONICALLY SIGNED 02/15/2014 16:27

## 2014-06-23 NOTE — Op Note (Signed)
PATIENT NAME:  [name], [name] MR#:  [id number] DATE OF BIRTH:  [birth year]  DATE OF PROCEDURE:  02/03/2014  PREOPERATIVE DIAGNOSIS: Left intertrochanteric hip fracture.   POSTOPERATIVE DIAGNOSIS: Left intertrochanteric hip fracture.   PROCEDURE PERFORMED: Treatment of left intertrochanteric hip fracture with intramedullary nail and screw. CPT code [id number].   SURGEON: Mardene Sayer, MD   ASSISTANT: None.   IMPLANTS: Synthes TFN, 130 degree short nail with 100 degree lag screw, and 30 mm interlocking screw.   OPERATIVE FINDINGS: Anatomic restoration of proximal femoral geometry and anatomy.   ANESTHETIC: Spinal.   FLUIDS: Per anesthesia record.   ESTIMATED BLOOD LOSS: 10 mL.   INDICATIONS: The patient is an 81 year old female with dementia who fell and suffered a displaced hip fracture on the left hip. Given the extremely painful nature of the fracture and low likelihood of successful nonoperative treatment, and the high morbidity of nonoperative with 2 hip fractures, decision was made for operative treatment of this injury.   DETAILED DESCRIPTION OF PROCEDURE: In supine position, the fracture table was utilized. The left leg was hung on the fracture table. Prep and drape was performed in the usual sterile fashion. Timeout was performed. Antibiotics were given.   Lateral incision was made just proximal to the tip of the trochanter. Mayo scissors were used to bluntly define the plane. Guidewire was placed on the tip of the trochanter. It was then passed in the intertrochanteric region. Opening reamer was then placed. We then placed a 11 mm TFN into the proximal femur. The head screw was then placed after placement of the guidewire. Length was measured, 100 mm screw was placed. We then placed a distal interlocking bolt off of the jig. All of the jig parts were removed. The screw was locked down in the proximal aspect of the sliding nail prior to removal of the jig. Final fluoroscopic images were  taken. The wounds were irrigated and then closed with Monocryl and staples. The patient was then awakened and taken to recovery in stable condition.    ____________________________ Mardene Sayer, MD pa:ts D: 02/03/2014 16:41:10 ET T: 02/04/2014 03:29:06 ET JOB#: [id number]  cc: Mardene Sayer, MD, <Dictator> Raelyn Ensign. Stephanie Acre, MD PhD Orthopaedic Surgery ELECTRONICALLY SIGNED 02/04/2014 21:37

## 2014-06-23 NOTE — Discharge Summary (Signed)
PATIENT NAME:  [name], GRAVER MR#:  [id number] DATE OF BIRTH:  Jul 12, 1924  DATE OF ADMISSION:  03/12/2013 DATE OF DISCHARGE:  03/13/2013  PRIMARY CARE PHYSICIAN: [place] Pink, MD  FINAL DIAGNOSES: 1.  Left pubic rami fracture.  2.  Urinary tract infection.  3.  Hypertension.  4.  Diabetes.  5.  Hypomagnesemia.  6.  Constipation.  7.  Gastroesophageal reflux disease.  8.  Hypothyroidism.   DISCHARGE MEDICATIONS: Include: 1.  Synthroid 75 mcg daily. 2.  Nexium 40 mg daily. 3.  Zantac 75 mg daily as needed for indigestion. 4.  Zoloft 50 mg daily. 5.  Indomethacin 75 mg daily. 6.  Toprol-XL 100 mg daily. 7.  Glucophage 500 mg twice a day, brand name. 8.  Multivitamin 1 tablet daily. 9.  Clorazepate 3.75 mg once a day at bedtime. 10.  Amlodipine 5 mg 1-1/2 tablets daily. 11.  Advil 200 mg twice a day. 12.  Aspirin 81 mg daily. 13.  MiraLax 17 grams once a day as needed for constipation. 14.  Magnesium oxide 400 mg daily. 15.  Acetaminophen 325 mg every 4 hours as needed for pain. 16.  Cephalexin 250 mg 3 times a day for 4 days.   ACTIVITY: As tolerated with physical therapy.   DIET: Low sodium, carbohydrate-controlled diet, regular consistency.  DISCHARGE FOLLOWUP: In 1 to 2 days with doctor at rehab, in 2 to 4 weeks with Dr. Kary Kos.   HISTORY OF PRESENT ILLNESS: The patient was admitted January 11th and discharged to rehab March 13, 2013. Came in with fall and leg pain. Was found to have a left inferior pubic rami fracture and a urinary tract infection. Was started on empiric Rocephin.   LABORATORY AND RADIOLOGICAL DATA DURING THE HOSPITAL COURSE: Included a urine culture that grew out greater than 100,000 gram-negative rods. Urinalysis: Positive leukocyte esterase. CT scan of the chest, abdomen, and pelvis showed no acute findings in the chest. No acute abdominopelvic findings. Degenerative changes involving the spine and scoliosis. Subtle inferior pubic rami fracture on  the left side. No hip fracture.   Glucose 119, BUN 19, creatinine 0.64, sodium 130, potassium 3.3, chloride 98, CO2 26, calcium 8.8. Liver function tests normal range. White blood cell count 16.1, H and H 11.8 and 34, and platelet count of 263. Magnesium 1.6. Repeat potassium 4.3. Creatinine upon discharge 0.6. White blood cell count upon discharge 12.8. Glucose upon discharge 121.   HOSPITAL COURSE PER PROBLEM LIST: 1.  For the left pubic rami fracture, the patient was seen in consultation by physical therapy who recommended rehab. The patient will be discharged to rehab today. Pain control will be difficult in this patient. She does have multiple allergies and cannot tolerate numerous medications. The patient is able to tolerate Advil 200 mg twice a day as needed. She does take indomethacin 75 mg daily. Tylenol only 325 mg every 4 hours as needed for pain. Would recommend giving an as needed medication prior to physical therapy at the rehab.  2.  Urinary tract infection with leukocytosis. The patient was started on IV Rocephin. White count did come down. The patient had no symptoms of this initially. Will switch over to cephalexin.  3.  Hypertension. She was kept on her usual Toprol and Norvasc.  4.  Diabetes. She is on Glucophage.  5.  Hypomagnesemia. I did replace this IV while here and will replace orally upon discharge. The patient also had hypokalemia, which was replaced into the normal range.  6.  Constipation. Will give MiraLax on a daily basis as needed for constipation.  7.  Gastroesophageal reflux disease. She is on Nexium.  8.  Hypothyroidism. The patient is on Synthroid.  TIME SPENT ON DISCHARGE: 35 minutes.   ____________________________ Tana Conch. Leslye Peer, MD rjw:sb D: 03/13/2013 13:40:26 ET T: 03/13/2013 13:50:41 ET JOB#: [id number]  cc: Tana Conch. Leslye Peer, MD, <Dictator> Irven Easterly. Kary Kos, [place] MD ELECTRONICALLY SIGNED 03/13/2013 15:35

## 2014-06-23 NOTE — Discharge Summary (Signed)
PATIENT NAME:  [name], [name] MR#:  [id number] DATE OF BIRTH:  [birth year]  DATE OF ADMISSION:  02/14/2014 DATE OF DISCHARGE:  02/15/2014   DISCHARGE DIAGNOSES:  1.  Urinary tract infection based on urinalysis.  2.  Metabolic encephalopathy likely due to urinary tract infection with underlying dementia.  3.  Hypokalemia, hypomagnesemia, repleted.   4.  Mild leukocytosis due to urinary tract infection, now resolved.   SECONDARY DIAGNOSES:  1.  Hypertension.  2.  Diabetes.  3.  Chronic systolic heart failure.  4.  Atrial fibrillation.  5.  Chronic obstructive pulmonary disease.  6.  Dementia.   CONSULTATIONS: None.   PROCEDURES AND RADIOLOGY:  1.  CT scan of the head and cervical spine showed no acute pathology.  Diffuse osteopenia is seen.  2.  Pelvic x-ray showed no acute fracture.   MAJOR LABORATORY PANEL:  Urinalysis on admission showed 1463 WBCs, 1+ bacteria, WBCs in clump present, 3+ Leukocyte esterase and positive nitrite.   Blood cultures x 2 were negative on admission.   HISTORY AND SHORT HOSPITAL COURSE:  The patient is an 81 year old female with the above-mentioned medical problems who was admitted for altered mental status, thought to be due to UTI.  She also had a fall while at the facility.  Please see Dr. Lianne Moris dictated history and physical for further details. The patient has been doing very well overall.  She is mentating much better. getting back to her baseline mental status.  After discussion with her power of attorney, [name], the decision is being made to discharge, her back to her facility as she seemed to be close to her baseline.  On the date of discharge, her vital signs are as follows: Temperature 98.1, heart rate 81 per minute, respirations 20 per minute, blood pressure 133/98, and she is saturating 95% on room air.   PERTINENT PHYSICAL EXAMINATION ON THE DATE OF DISCHARGE:  CARDIOVASCULAR: S1, S2 normal. No murmurs, rubs or gallop.  LUNGS: Clear to  auscultation bilaterally. No wheezing, rales, rhonchi, or crepitation.  ABDOMEN: Soft, benign.  NEUROLOGIC: Nonfocal examination.  All other physical examination remained at baseline.   DISCHARGE MEDICATIONS:   Medication Instructions  synthroid 75 mcg (0.075 mg) oral tablet  1 tab(s) orally once a day   toprol-xl 100 mg oral tablet, extended release  1 tab(s) orally once a day   glucophage 500 mg oral tablet  1 tab(s) orally 2 times a day   multivitamin  1 tab(s) orally once a day   clorazepate 3.75 mg oral tablet  1 tab(s) orally once a day (at bedtime)   spiriva 18 mcg inhalation capsule  1 each inhaled once a day, As Needed - for Shortness of Breath   acetaminophen 325 mg oral tablet  2 tab(s) orally every 4 hours, As needed, mild pain (1-3/10) or temp. greater than 100.4   docusate sodium 100 mg oral capsule  2 cap(s) orally 2 times a day   enoxaparin  30 milligram(s) subcutaneous every 12 hours till Dec 22nd and then stop  zoloft 50 mg oral tablet  1 tab(s) orally once a day   magnesium oxide 400 mg oral tablet  1 tab(s) orally once a day   invanz 1 g injectable powder for injection  1 gram(s) injectable (IM) once a day till Dec 19th and then stop  aspirin 81 mg oral tablet, chewable  1 tab(s) orally once a day   omeprazole 20 mg oral delayed release capsule  1 cap(s)  orally once a day   novolog flexpen 100 units/ml subcutaneous solution  1 dose(s) subcutaneous 4 times a day (before meals and at bedtime) 150-200= 2u 201-250= 4u 251-300= 6u 301-350= 8u 351-400= 10u     DISCHARGE DIET: Low sodium, low fat, low cholesterol.   DISCHARGE ACTIVITY: As tolerated.    DISCHARGE INSTRUCTIONS AND FOLLOW-UP:  The patient was instructed to follow up with her primary care physician, Dr. Juluis Pitch in 1 to 2 days while at the facility. She will need follow-up with [facility] in 2 to 4 weeks. Please try to avoid benzodiazepine and narcotics, if at all possible considering her  advanced age with underlying dementia as this can make her more confused.  Please also consider stopping Zantac, potassium chloride hydroxyzine, tapentadol, Bisacodyl and Lasix unless needed. This can be restarted per discussion with her physician while at the facility. Please have Palliative care services to follow while at facility as she remains at very high risk for readmissions.   TOTAL TIME DISCHARGING THIS PATIENT: 45 minutes.     ____________________________ Lucina Mellow. Manuella Ghazi, MD vss:DT D: 02/15/2014 07:57:24 ET T: 02/15/2014 08:14:18 ET JOB#: [id number]  cc: Lyllie Cobbins S. Manuella Ghazi, MD, <Dictator> Youlanda Roys. Lovie Macadamia, MD Irven Easterly. Kary Kos, [place] MD ELECTRONICALLY SIGNED 02/17/2014 17:10

## 2014-06-23 NOTE — Discharge Summary (Signed)
PATIENT NAME:  [name], [name] MR#:  [id number] DATE OF BIRTH:  [birth year]  DATE OF ADMISSION:  02/03/2014 DATE OF DISCHARGE:  02/06/2014  CHIEF COMPLAINT AT THE TIME OF ADMISSION: Left hip fracture.  ADMITTING DIAGNOSES:  1.  Left hip intertrochanteric fracture. 2.  Diabetes mellitus type 2.  3.  Hypertension.  4.  Atrial fibrillation.  5.  Congestive heart failure.  6.  Chronic obstructive pulmonary disease.  7.  Osteopenia.  8.  Dementia.  9.  Parkinsonism.   DISCHARGE DIAGNOSES: 1.  Left hip intertrochanteric fracture, status post internal fixation by Dr. Mack Guise on 12/05.  2.  Dementia with agitation.  3.  Diabetes.  4.  Hypertension.  5.  Chronic atrial fibrillation, rate controlled on aspirin and Toprol-XL.   6.  Chronic systolic congestive heart failure.  7.  Chronic obstructive pulmonary disease, no exacerbation.  8.  Osteopenia.  9.  Parkinsonism.   PROCEDURES: Internal fixation of left hip fracture by Dr. Mack Guise on 12/05.   CONSULTATIONS: Orthopedics.   BRIEF HISTORY AND PHYSICIAN AND HOSPITAL COURSE: The patient is an 81 year old Caucasian female who came into the ED after she sustained a fall. She was diagnosed with left hip fracture and admitted to hospitalist service. Orthopedics were consulted. The patient was at high risk for surgery due to her other comorbidities, but recently she was admitted in August with right hip fracture and at that point she was evaluated by cardiology, Dr. Neoma Laming, who has cleared her for noncardiac surgery, though she is at high-risk with baby aspirin and beta blocker. Having said that, the patient was cleared for surgery and had left hip internal fixation done on December 5th. Postoperatively, orthopedics continued to follow her. Pain management was provided by orthopedics.   The patient has chronic history of dementia and she had a few intermittent episodes of agitation requiring Geodon. The patient responded very well to  Xanax, so we started giving her Xanax as needed basis for anxiety and agitation.   Regarding diabetes mellitus, initially the patient was maintained on sliding scale insulin, but as she started eating we are resuming metformin.   The patient has chronic atrial fibrillation which is rate controlled. We will continue baby aspirin and Toprol-XL.   The rest of the comorbidities, COPD, no exacerbation, we will continue Spiriva and congestive heart failure, which is systolic and chronic in nature, we will monitor daily weights and continue her home medication Lasix.   Dr. Mack Guise has recommended to continue Lovenox 30 mg subcutaneous q. 12 hours for the next 15 days, regarding the hip surgery. The patient's Foley catheter is discontinued. The patient lives alone and as she is falling frequently we have considered to place her at skilled nursing facility. This was discussed with case Freight forwarder and Education officer, museum. The patient is getting transferred to a skilled nursing facility under stable condition today. This was discussed with the patient's power of attorney, [name]. The plan of care was discussed in detail. She is aware and agreeable with the current plan.   PHYSICAL EXAMINATION: VITAL SIGNS: Temperature 97, pulse 69 to 93, respirations 18, blood pressure is 150/100, pulse oximetry 95% on room air. Accu-Cheks 99, 107, 124. GENERAL APPEARANCE: Not in acute distress, resting comfortably, arousable, but not answering questions.  HEENT: Normocephalic, atraumatic. Pupils are equally reacting to light and accommodation.  NECK: Supple. No JVD. No thyromegaly. Range of motion is intact.  LUNGS: Clear to auscultation bilaterally. No accessory muscle usage. No chest wall tenderness  on palpation.  CARDIOVASCULAR: Irregularly irregular. No JVD.  GASTROINTESTINAL: Soft. Bowel sounds are positive in all 4 quadrants. Nontender, nondistended. No masses felt.  NEUROLOGICAL: Arousable but still lethargic as she just  received Xanax.  PSYCHIATRIC: The patient is arousable, goes to Dr. Vaughan [name], her medical power of attorney. No agitation at this time.  EXTREMITIES: Left hip with clean dressing.   SIGNIFICANT LABORATORIES AND IMAGING STUDIES: WBC 12.6 on December 6th. Hemoglobin hematocrit, and platelets are normal. TSH is normal. Sodium and potassium are normal. Hemoglobin A1c of 6.1.   MEDICATIONS AT THE TIME OF DISCHARGE: Synthroid 75 mcg p.o. once daily, Nexium 40 mg p.o. once daily, Zantac 75 mg p.o. once daily, Toprol-XL 100 mg p.o. once a day, Glucophage 500 mg p.o. b.i.d., multivitamin once daily, clorazepate 3.75 mg p.o. at bedtime, Lasix 20 mg p.o. 2 times a day as needed for swelling, potassium chloride 10 mEq once a day as needed when Lasix is given, Spiriva 18 mcg capsule inhalation once daily as needed for shortness of breath, Zoloft 100 mg 1/2 tablet once a day at bedtime, hydroxyzine/hydrochloride 10 mg 1 tablet p.o. 3 times a day as needed for itching, Tylenol 325 mg 2 tablets p.o. every 4 hours as needed for mild pain, tapentadol 50 mg 1 tablet p.o. every 6 hours as needed for moderate to severe pain, Bisacodyl 5 mg 1 tablet p.o. 2 times a day as needed for constipation, bethanechol 25 mg 1 tablet p.o. 3 times a day for 2 days, Colace 100 mg p.o. 2 tablets p.o. b.i.d., Lovenox 30 mg subcutaneous q. 12 hours for the next 2 weeks, aspirin 81 mg once daily, alprazolam 0.5 mg 1 tablet p.o. 2 times a day as needed for anxiety.   DIET: Low sodium, low fat.   ACTIVITY: As tolerated per physical therapy recommendations  FOLLOWUP: With primary care physician in 1 week and orthopedics, Dr. Mack Guise, in 2 weeks. Continue wound care as recommended by orthopedics. The plan of care was discussed in detail with the patient's power of attorney, [name]. She verbalized understanding of the plan.   TOTAL TIME SPENT: Forty-five minutes.   ____________________________ Nicholes Mango, MD ag:TT D: 02/06/2014 15:30:21  ET T: 02/06/2014 16:15:31 ET JOB#: [id number]  cc: Nicholes Mango, MD, <Dictator> Timoteo Gaul, MD Primary Care Physician  Nicholes Mango MD ELECTRONICALLY SIGNED 02/15/2014 17:02

## 2014-06-23 NOTE — H&P (Signed)
PATIENT NAME:  [name], CHOHAN MR#:  [id number] DATE OF BIRTH:  [birth year]  DATE OF ADMISSION:  03/12/2013  ADMITTING PHYSICIAN: Gladstone Lighter, M.D.   PRIMARY CARE PHYSICIAN: Dr. Kary Kos.   CHIEF COMPLAINT: Fall and left leg pain.   HISTORY OF PRESENT ILLNESS: Ms. Maradiaga is a pleasant 81 year old Caucasian female with past medical history significant for hypertension, diabetes, and cardiomyopathy. Presents from home after she had a fall and complains of left leg pain and cannot bear weight on the left leg. She is noted to have a left inferior pubic ramus fracture here and is being admitted for the same. Also noted to have a urinary tract infection. The patient denies any chest pain, syncopal episode, nausea, vomiting, or feeling sick lately. She lives at home by herself and is pretty independent and functional at baseline. She uses a walker at baseline.   PAST MEDICAL HISTORY: 1.  Hypertension.  2.  Diabetes mellitus.  3.  Congestive heart failure with cardiomyopathy.  4.  Gastroesophageal reflux disease.   PAST SURGICAL HISTORY: 1.  Oophorectomy for ovarian cyst on the right side.  2.  Appendectomy.  3.  Hysterectomy.  4.  Cholecystectomy.   ALLERGIES TO MEDICATIONS: DEMEROL, OXYCODONE, SULFA, VICODIN.   CURRENT HOME MEDICATIONS:  1.  Synthroid 75 mcg p.o. daily.  2.  Nexium 40 mg p.o. daily.  3.  Stool softener 100 mg p.o. b.i.d.  4.  Aspirin 81 mg p.o. daily.  5.  Norvasc 7.5 mg p.o. daily.  6.  Advil 200 mg in the afternoon.  7.  Glucophage 500 mg p.o. b.i.d.  8.  Chloraseptic throat spray as needed. 9.  Zoloft 50 mg at bedtime. 10.  Zantac 75 mg p.o. daily. 11.  Toprol 100 mg p.o. daily.   SOCIAL HISTORY: Lives at home by herself. She has 2 daughters one of which lives in [place], [place] but she has a caregiver who comes in and helps driving her to the doctor appointments and for groceries. She uses a walker at baseline. No smoking.  Drinks occasionally wine.   FAMILY HISTORY: Significant for heart disease and stroke in mother and pancreatic cancer in father.  REVIEW OF SYSTEMS: CONSTITUTIONAL: No fever, fatigue, or weakness.  EYES: No blurry vision, double vision, or glaucoma. Had cataract surgery done.  ENT: No tinnitus, ear pain, hearing loss, epistaxis, or discharge.  RESPIRATORY: No cough, wheeze, hemoptysis, or COPD. CARDIOVASCULAR: No chest pain, orthopnea, edema, arrhythmia, palpitations, or syncope. GASTROINTESTINAL: No nausea, vomiting, diarrhea, abdominal pain, hematemesis, or melena.  GENITOURINARY: No dysuria, hematuria, renal calculus, frequency, or incontinence.  ENDOCRINE: No polyuria, nocturia, thyroid problems, heat or cold intolerance.  HEMATOLOGY: No anemia, easy bruising, or bleeding.  SKIN: No acne, rash, or lesions.  MUSCULOSKELETAL: Complains of left hip pain and left thigh pain on weight bearing and moving. No arthritis or gout.  NEUROLOGIC: No numbness, weakness, CVA, TIA, or seizures.  PSYCHOLOGICAL: No anxiety, insomnia, or depression.   PHYSICAL EXAMINATION: VITAL SIGNS: Temperature 97.8 degrees Fahrenheit, pulse 63, respirations 20, blood pressure 166/66, pulse ox 98% on room air.  GENERAL: Well-built, well-nourished female lying in bed, not in any acute distress.  HEENT: Normocephalic, atraumatic. Pupils equal, round, and reacting to light. Anicteric sclerae. Extraocular movements intact. Oropharynx is clear without erythema, mass, or exudates.  NECK: Supple. No thyromegaly, JVD, or carotid bruits. No lymphadenopathy.  LUNGS: Moving air bilaterally. No wheeze or crackles. No use of accessory muscles for breathing.  CARDIOVASCULAR: S1 and S2 regular rate and rhythm. 3/6 systolic murmur present. No rubs or gallops.  ABDOMEN: Soft, nontender, nondistended. No hepatosplenomegaly. Normal bowel sounds.  EXTREMITIES: No pedal edema. No clubbing or cyanosis. 2+ dorsalis pedis pulses palpable  bilaterally.  SKIN: No acne, rash, or lesions. LYMPH: No cervical or inguinal lymphadenopathy.  NEUROLOGIC: Cranial nerves intact. No focal motor or sensory deficits. She complains of just left pubic pain radiating down in her thigh towards the knee on flexing and weight bearing, but that is not limiting her strength while in bed. Sensation is intact.  PSYCHOLOGIC: The patient is awake, alert, and oriented x3.   LABORATORY AND DIAGNOSTICS: WBC 16.1, hemoglobin 11.8, hematocrit 34, platelet count 263.   Sodium 130, potassium 3.3, chloride 98, bicarb 26, BUN 19, creatinine 0.64, glucose 119, and calcium of 8.8.   ALT 27, AST 21, alk phos 58, total bili 0.4, and albumin of 3.6. Urinalysis with nitrite positive, 2+ leukocyte esterase, 3+ bacteria, and 93 WBCs.  CT of the chest, abdomen, and pelvis done after a fall showing no acute findings in the chest. No acute findings in the abdominal or pelvic regions. Advanced degenerative changes in the spine and scoliosis. Old lumbar compression fracture is noted. Remote sternal fracture and suspected rheumatoid fractures noted. Subtle inferior pubic ramus fracture on the left side. No hip fracture noted.    ASSESSMENT AND PLAN: This is an 81 year old female with history of hypertension, diabetes, and cardiomyopathy who lives at home by herself brought in after a fall and noted to have pelvic fracture.  1.  Left inferior pubic rami fracture. Pain control and physical therapy has been ordered. Will need home health versus rehab.  2.  Urinary tract infection. Urine cultures have been ordered. Started on empiric Rocephin.  3.  Hypertension. Continue home medication. 4.  Diabetes mellitus. Continue Glucophage. Started on sliding scale insulin.  5.  Gastroesophageal reflux disease. On Protonix and Zantac.   CODE STATUS: FULL code.   TIME SPENT ON ADMISSION: 50 minutes. ____________________________ Gladstone Lighter, MD rk:sb D: 03/12/2013 21:57:51  ET T: 03/13/2013 07:37:05 ET JOB#: [id number]  cc: Gladstone Lighter, MD, <Dictator> Irven Easterly. Kary Kos, MD Gladstone Lighter MD ELECTRONICALLY SIGNED 03/14/2013 15:01

## 2014-06-23 NOTE — Consult Note (Signed)
PATIENT NAME:  [name], [name] MR#:  [id number] DATE OF BIRTH:  1924-03-03  DATE OF CONSULTATION:  10/24/2013  REFERRING PHYSICIAN:  Laurene Footman, MD CONSULTING PHYSICIAN:  Dionisio David, MD  CARDIOLOGY CONSULTATION   INDICATION FOR CONSULTATION: Atrial fibrillation. Preoperative clearance for hip surgery by Dr. Rudene Christians.   HISTORY OF PRESENT ILLNESS: This is an 80 year old white  female with past medical history of hypertension, diabetes, congestive heart failure, atrial fibrillation, apparently has chronic atrial fibrillation on aspirin and beta blockers, who presented to the hospital after falling down. She broke her hip and arm. I was asked to evaluate the patient prior to surgery. The patient denies any chest pain, shortness of breath, PND, orthopnea, or leg swelling.   PAST MEDICAL HISTORY: Hypertension, diabetes, CHF, AFib with controlled ventricular response rate, COPD.   SOCIAL HISTORY: No EtOH abuse or smoking.   FAMILY HISTORY: Positive for coronary artery disease.   ALLERGIES: DEMEROL, OXYCONTIN, SULFA DRUGS AND VICODIN.  HOME MEDICATIONS: Advil, acetaminophen, Glucophage 500 twice a day, Lasix 30 mg twice a day, Nexium 40 mg once a day, potassium chloride 10 mg p.o. daily, Synthroid 75 mcg a day, Toprol-XL 100 mg daily, Zantac 75 mg once a day, and Zoloft 100 mg 0.5 tablet once a day.   PHYSICAL EXAMINATION:  GENERAL: She is alert and oriented x 3, in no acute distress.  VITAL SIGNS: Stable. Her rhythm appears to be irregular with AFib.  NECK: No JVD.  LUNGS: Clear.  HEART: Irregularly irregular. Normal S1, S2. No audible murmur.  ABDOMEN: Soft, nontender. Positive bowel sounds.  EXTREMITIES: No pedal edema.  NEUROLOGIC: The patient appears to be intact.   ELECTROCARDIOGRAM: Shows AFib, 86 beats per minute, low voltage, left axis deviation, old inferior wall myocardial infarction, nonspecific ST-T changes.   LABORATORY DATA: Her BUN is 14, creatinine 0.8, glucose 118,  potassium 3.7. Her hemoglobin is 12.1, white count 10.2, platelet count 225,000.   ASSESSMENT AND PLAN: The patient has chronic atrial fibrillation, for which she takes aspirin and Toprol-XL 100 mg p.o. daily. She denies any chest pain or shortness of breath. She is not in heart failure. Her ventricular rate with atrial fibrillation is well controlled.   Advise continuation of aspirin and beta blocker, and proceed with her hip surgery.   Thank you very much for the referral.    ____________________________ Dionisio David, MD sak:MT D: 10/24/2013 12:52:06 ET T: 10/24/2013 13:09:49 ET JOB#: [id number]  cc: Dionisio David, MD, <Dictator> Dionisio David MD ELECTRONICALLY SIGNED 11/16/2013 13:30

## 2014-06-23 NOTE — Consult Note (Signed)
Brief Consult Note: Diagnosis: Left intertrochanteric hip fracture.   Patient was seen by consultant.     Recommend further assessment or treatment.   Orders entered.   Comments: 81 year old female with reported fall.  She is unable to provide an accurate history.  She has a left intertrochanteric hip fracture.  I recommend to proceed with a left hip IM nail for fixation of her fracture tomorrow if she clears medically for surgery.  Electronic Signatures: Thornton Park (MD)  (Signed 05-Dec-15 13:33)  Authored: Brief Consult Note   Last Updated: 05-Dec-15 13:33 by Thornton Park (MD)

## 2014-08-01 DEATH — deceased

## 2016-11-10 IMAGING — CR DG C-ARM 61-120 MIN-NO REPORT
6 series · 10 of 10 positions shown · non-contrast
Comparison: 02/02/2014

CLINICAL DATA: Patient status post ORIF intertrochanteric left
femur fracture.

EXAM:
LEFT HIP - 1 VIEW:; DG C-ARM 61-120 MIN-NO REPORT

[cont. (1 of 5)]
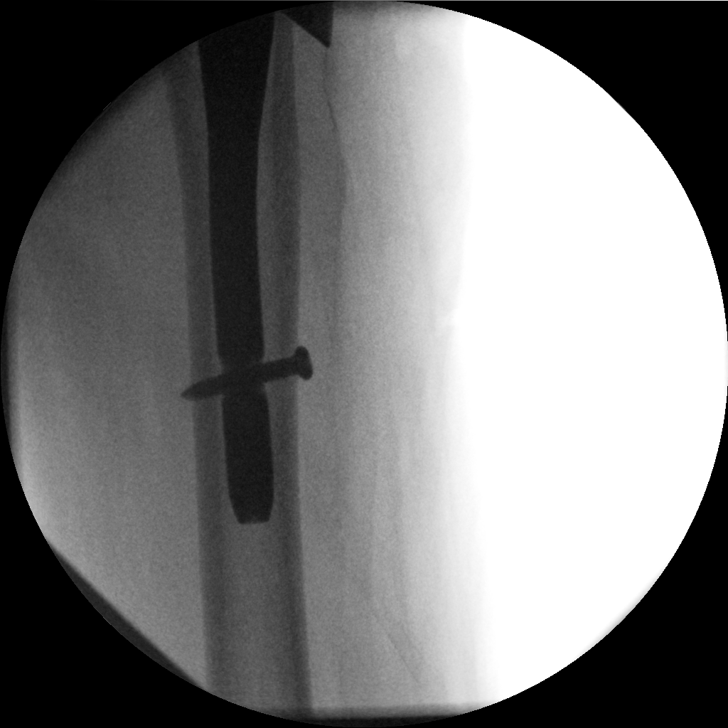

[cont. (2 of 5)]
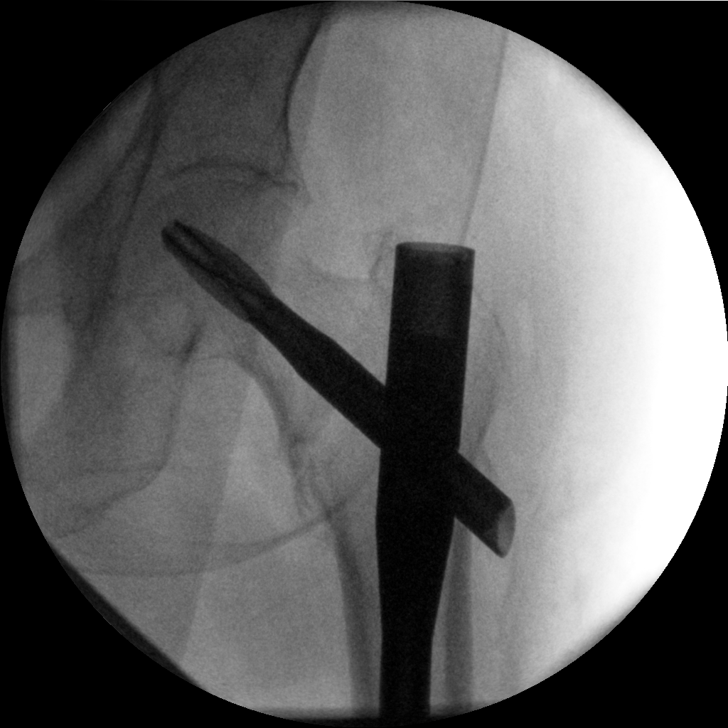

[cont. (3 of 5)]
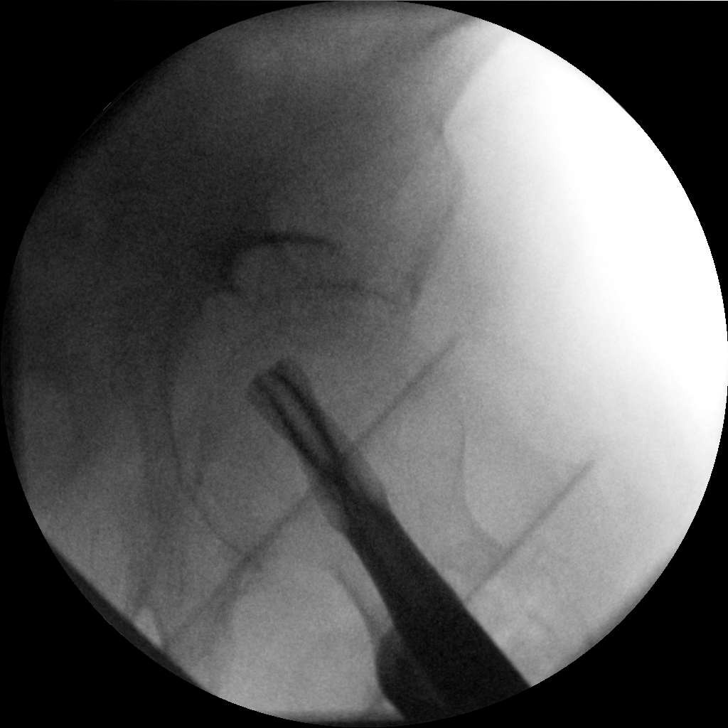

[cont. (4 of 5)]
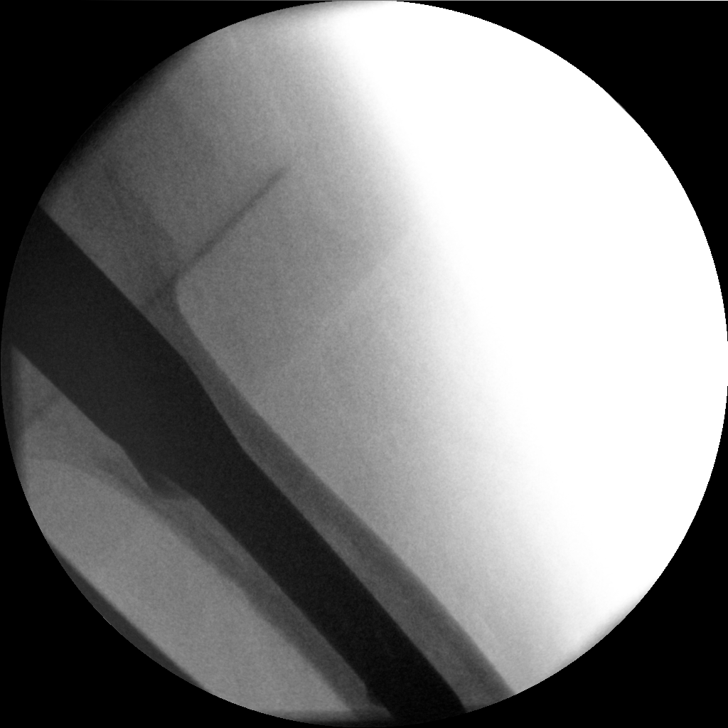

[cont. (5 of 5)]
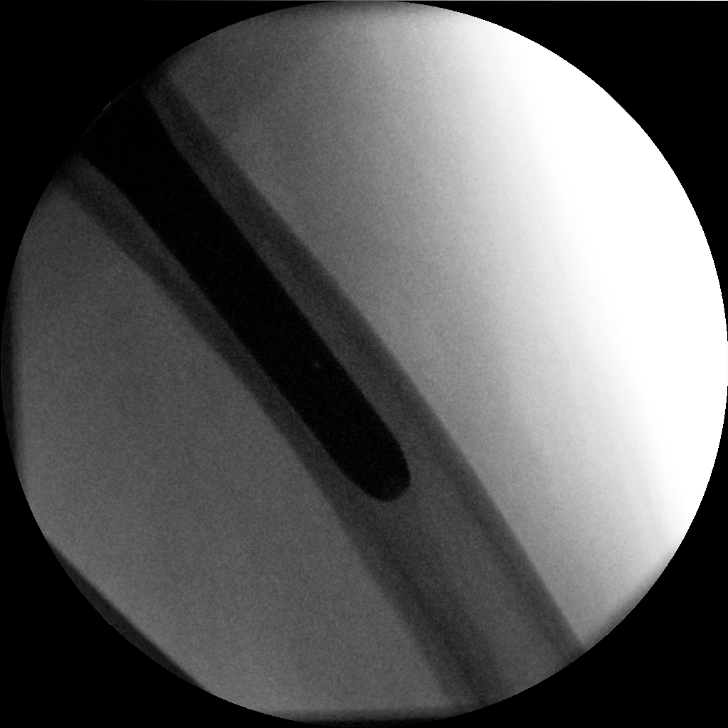

[Series 6001: (person_name) · 5 of 5 slices shown]
[im 1/5]
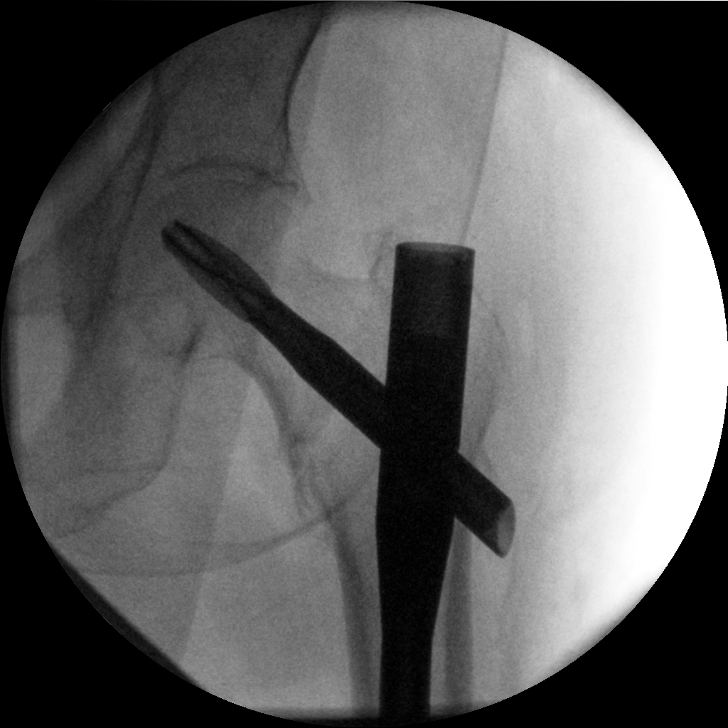
[im 2/5]
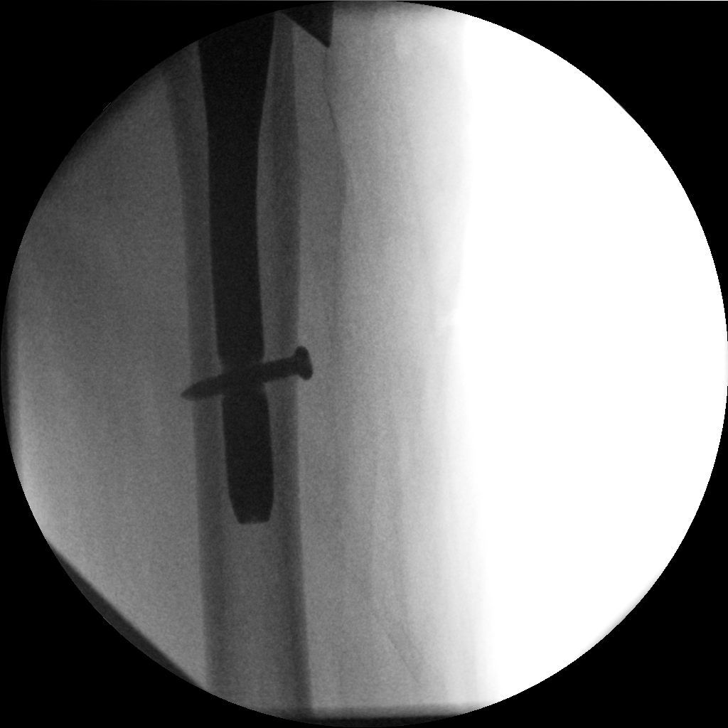
[im 3/5]
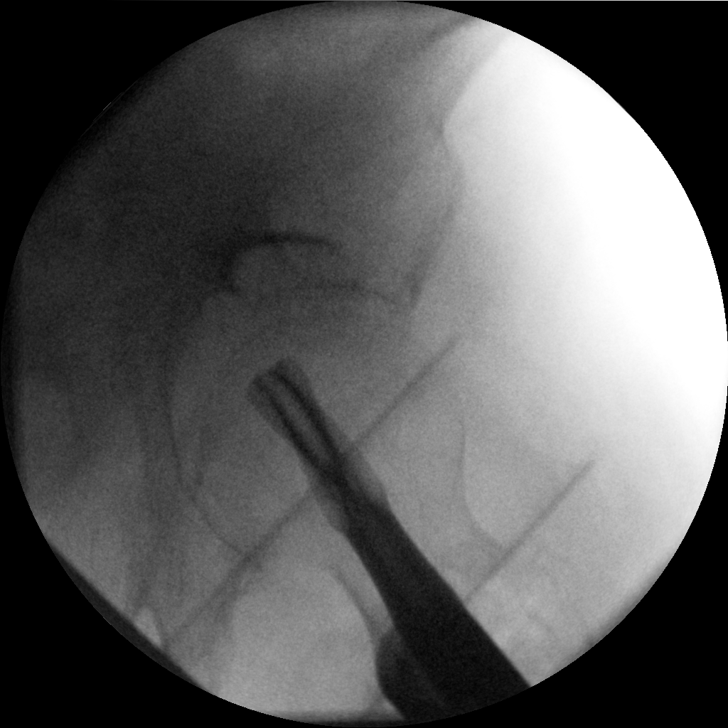
[im 4/5]
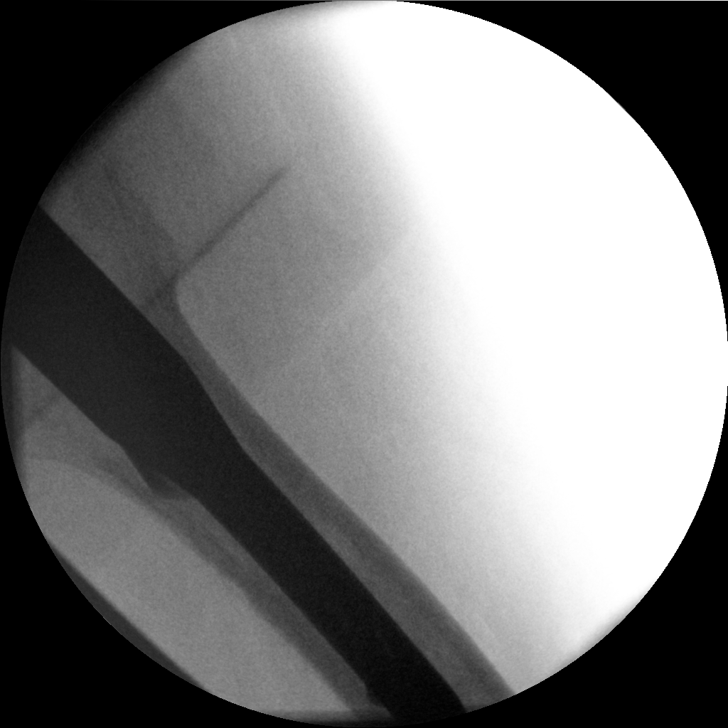
[im 5/5]
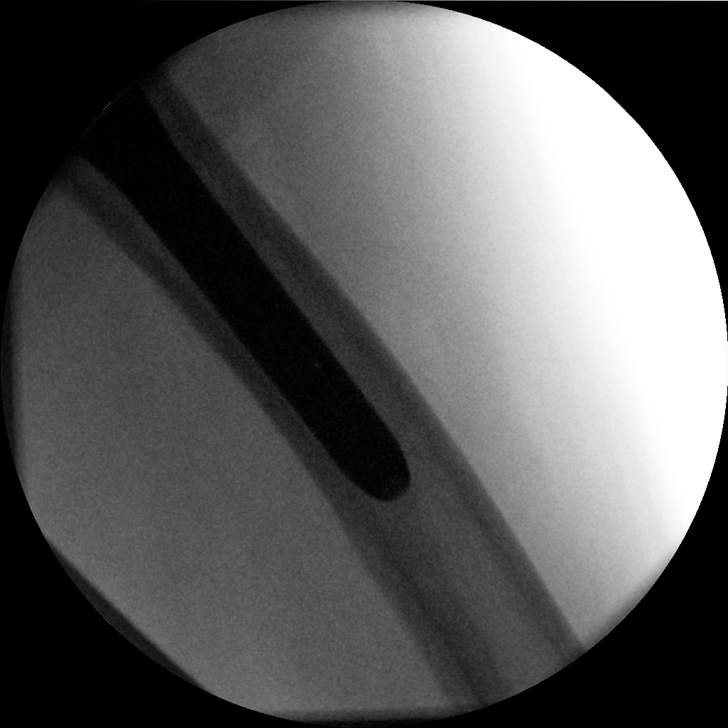

[10 of 10 positions shown; findings below may reference images not displayed]

FINDINGS: Multiple fluoroscopic spot images demonstrate patient to be status
post intra medullary rod fixation of left intertrochanteric femur
fracture. On limited views, the hardware appears intact.
IMPRESSION: Postsurgical change proximal left femur.

## 2019-08-03 NOTE — Patient Instructions (Signed)
error
